# Patient Record
Sex: Male | Born: 1960 | Race: Black or African American | Hispanic: No | Marital: Single | Smoking: Never smoker
Health system: Southern US, Community
[De-identification: ages and names within clinical notes are randomized; demographics above are authoritative.]

## PROBLEM LIST (undated history)

## (undated) DIAGNOSIS — M6282 Rhabdomyolysis: Secondary | ICD-10-CM

## (undated) DIAGNOSIS — N289 Disorder of kidney and ureter, unspecified: Secondary | ICD-10-CM

## (undated) DIAGNOSIS — F79 Unspecified intellectual disabilities: Secondary | ICD-10-CM

## (undated) DIAGNOSIS — Z789 Other specified health status: Secondary | ICD-10-CM

## (undated) DIAGNOSIS — R1310 Dysphagia, unspecified: Secondary | ICD-10-CM

## (undated) DIAGNOSIS — K859 Acute pancreatitis without necrosis or infection, unspecified: Secondary | ICD-10-CM

## (undated) DIAGNOSIS — Z7289 Other problems related to lifestyle: Secondary | ICD-10-CM

## (undated) DIAGNOSIS — F32A Depression, unspecified: Secondary | ICD-10-CM

---

## 2009-02-07 ENCOUNTER — Emergency Department: Payer: Self-pay | Admitting: Emergency Medicine

## 2009-02-07 IMAGING — CT CT HEAD WITHOUT CONTRAST
2 series · 16 of 30 positions shown, 20 images · non-contrast
Comparison: none

REASON FOR EXAM: fall, etoh
COMMENTS:

PROCEDURE:     CT  - CT HEAD WITHOUT CONTRAST  - [DATE] [DATE]
RESULT:
HISTORY: Fall.

[Series 3: without · axial · non-contrast · 0.49mm/px · z∈[-16,+118]mm · 13 of 33 slices shown, 17 images]
[im 3/33  brain]
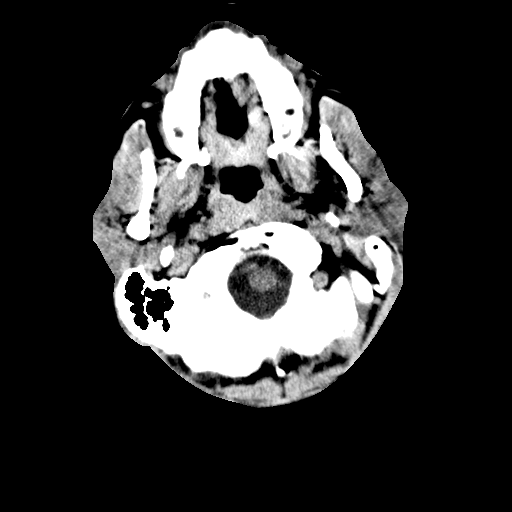
[im 3/33  bone]
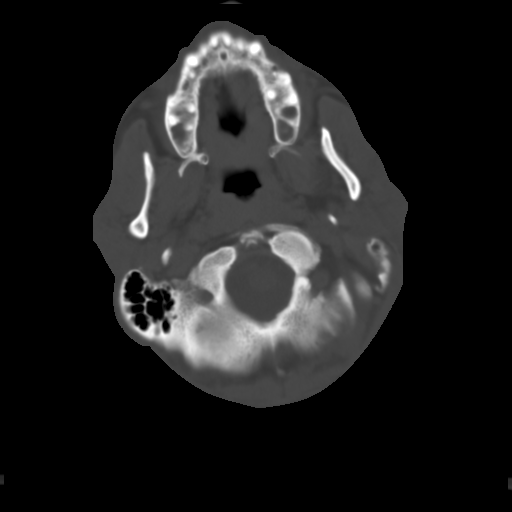
[im 5/33  brain]
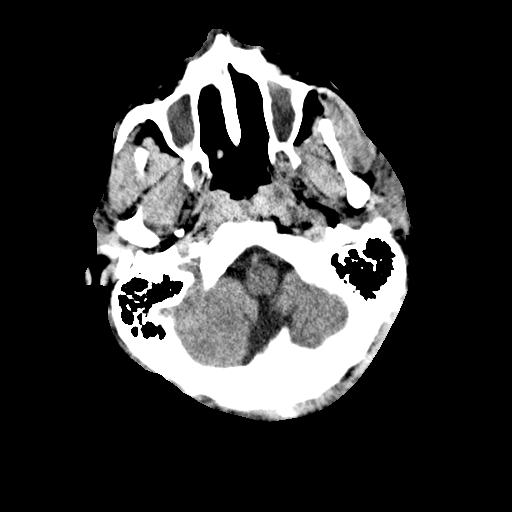
[im 7/33  brain]
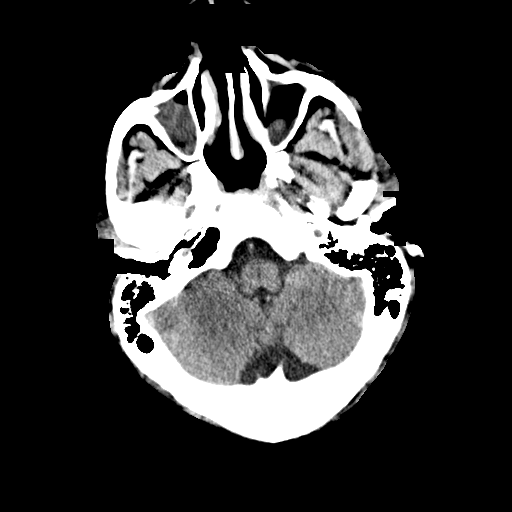
[im 10/33  brain]
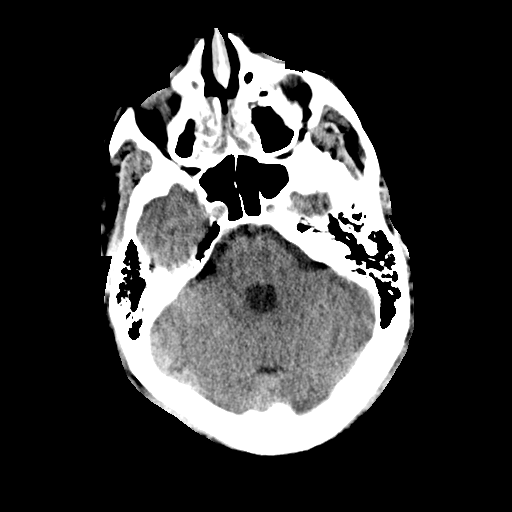
[im 12/33  brain]
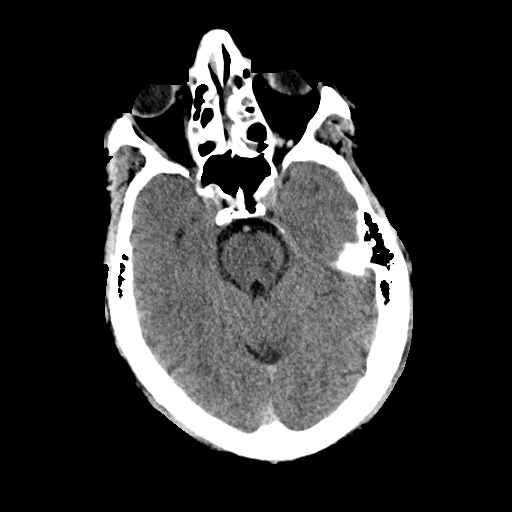
[im 12/33  bone]
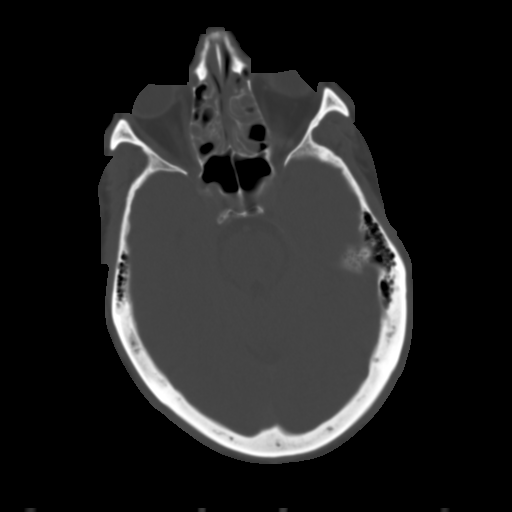
[im 14/33  brain]
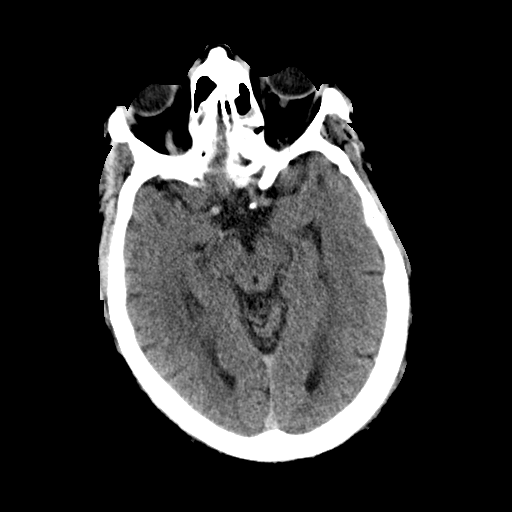
[im 17/33  brain]
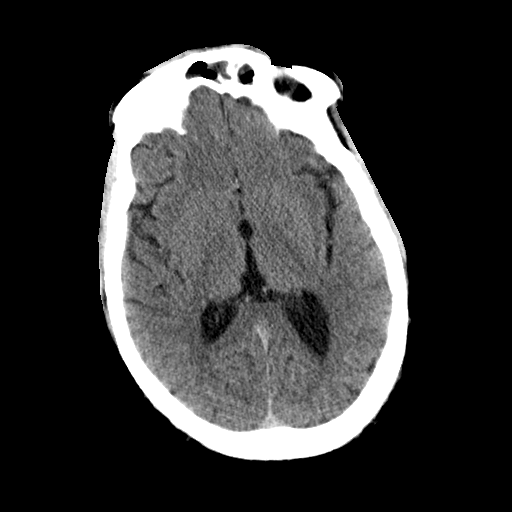
[im 19/33  brain]
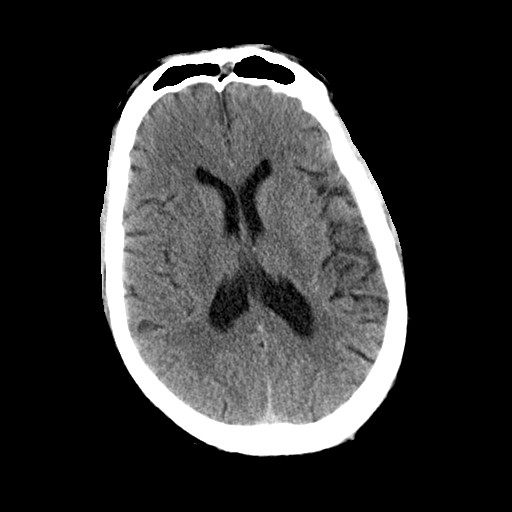
[im 21/33  brain]
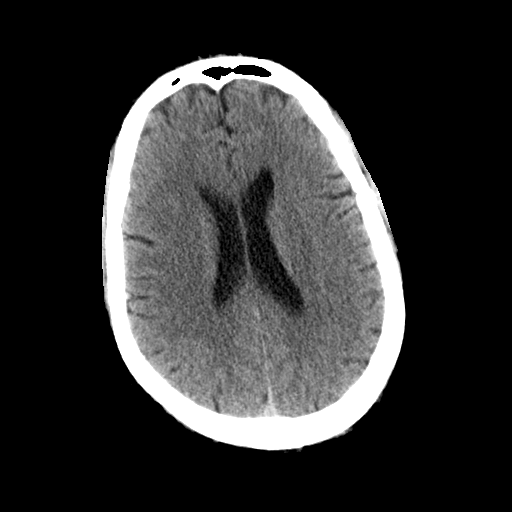
[im 21/33  bone]
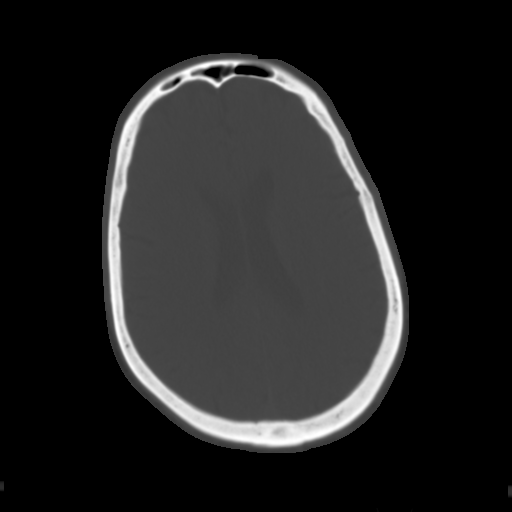
[im 23/33  brain]
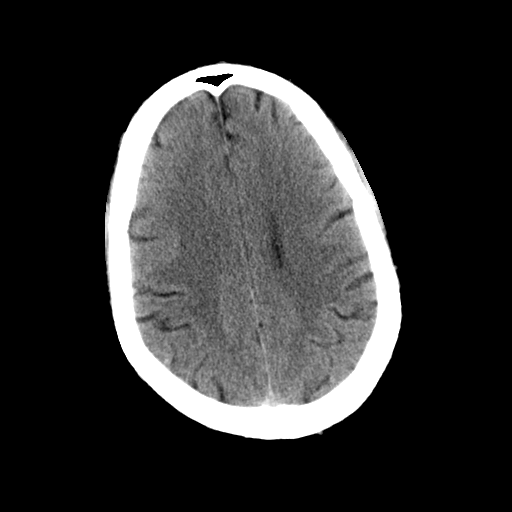
[im 26/33  brain]
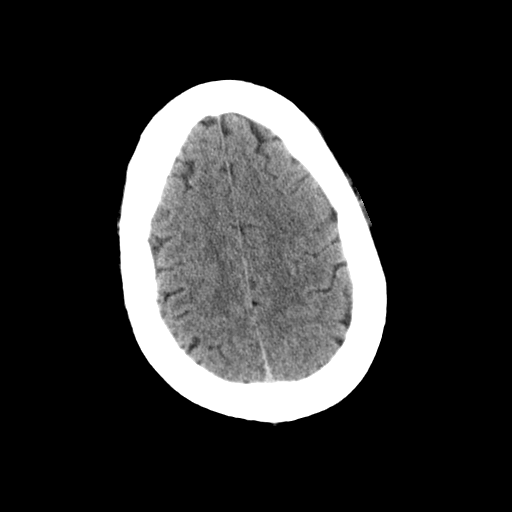
[im 28/33  brain]
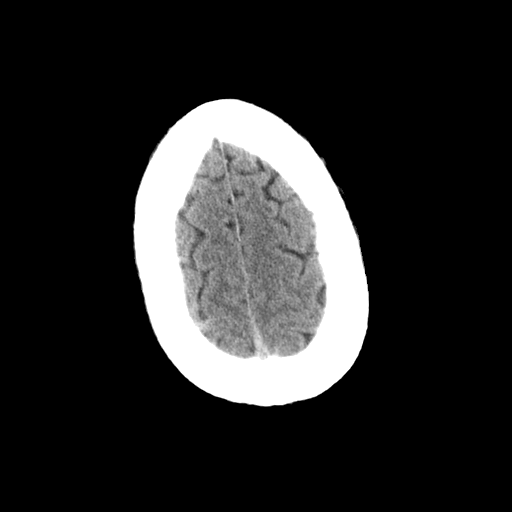
[im 30/33  brain]
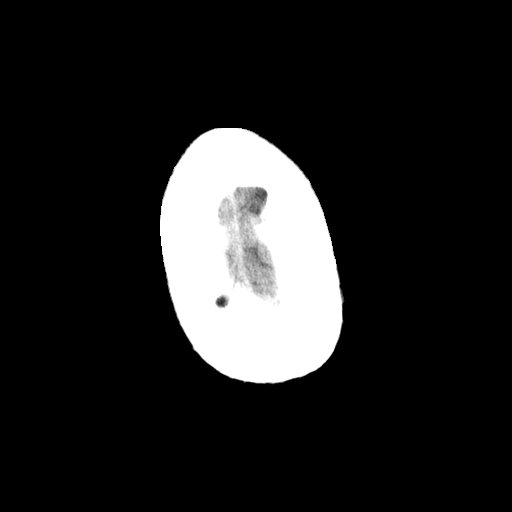
[im 30/33  bone]
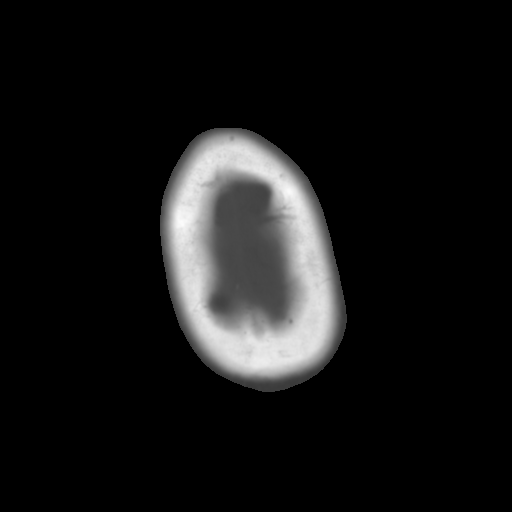

[Series 4: bone · axial · 0.49mm/px · z∈[-16,+28]mm · 3 of 33 slices shown]
[im 3/33  bone]
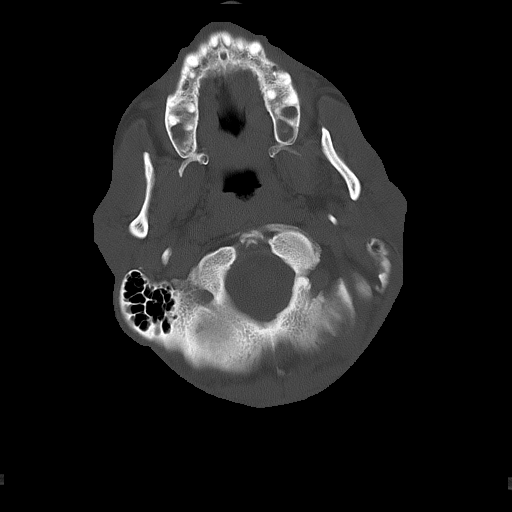
[im 7/33  bone]
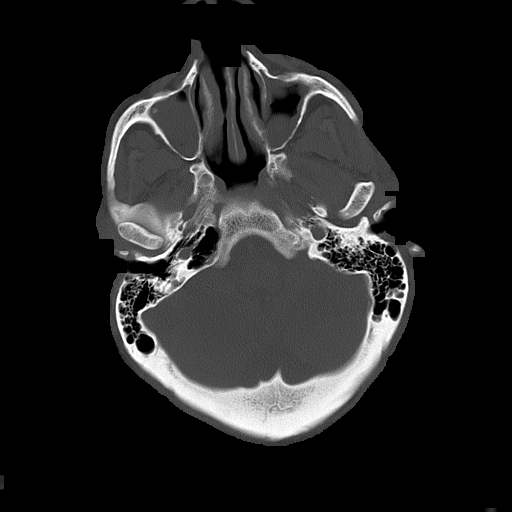
[im 12/33  bone]
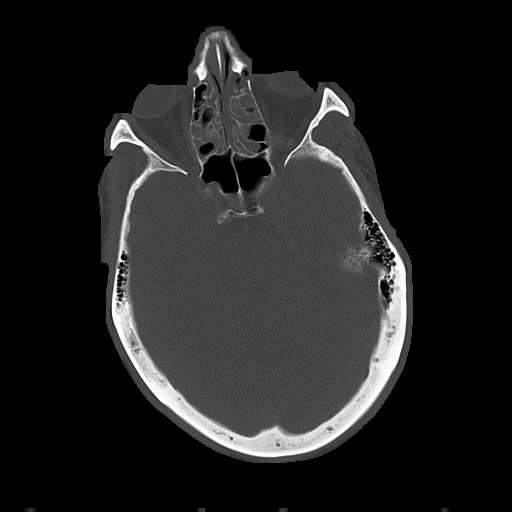

[16 of 30 positions shown; findings below may reference images not displayed]

COMPARISON STUDIES: None.

PROCEDURE AND FINDINGS: Standard non-enhanced Head CT is obtained. There is
a low density lesion in the LEFT pons. This is most consistent with an old
infarct. MRI can be obtained for further evaluation.  No acute intra-axial
or extra-axial pathologic fluid collections are noted. No hydrocephalus is
noted. No acute bony abnormalities are identified. Frontal, ethmoidal,
maxillary sinus changes are noted consistent with sinusitis.
IMPRESSION: 1. Low density lesion in the pons most consistent with old infarct.

2. Changes of sinusitis.

## 2009-02-07 IMAGING — CT CT CERVICAL SPINE WITHOUT CONTRAST
3 series · 16 of 33 positions shown, 19 images · non-contrast
Comparison: none

REASON FOR EXAM: fall, etoh
COMMENTS:

PROCEDURE:     CT  - CT CERVICAL SPINE WO  - [DATE] [DATE]
RESULT:
HISTORY: Fall.
COMPARISON STUDIES: No prior.
PROCEDURE AND FINDINGS: Standard Cervical Spine CT is obtained.  No evidence
of fracture. Good anatomic alignment is noted. No evidence of dislocation.
Diffuse degenerative change is noted.

[Series 4: axial · axial · 0.34mm/px · z∈[-173,-11]mm · 8 of 98 slices shown, 10 images]
[im 8/98  soft-tissue]
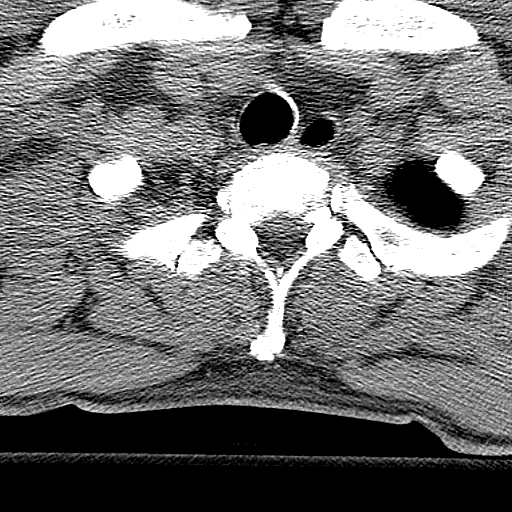
[im 8/98  bone]
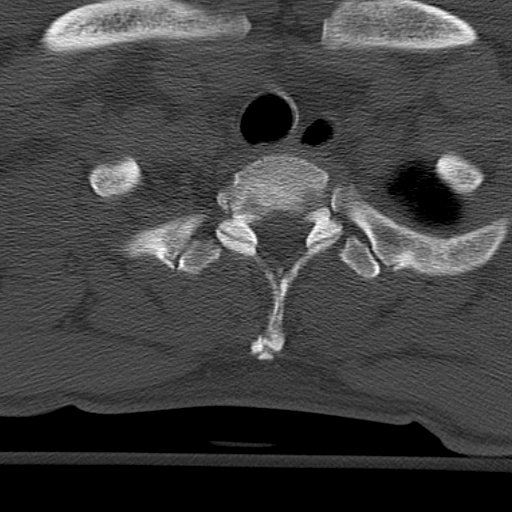
[im 23/98  bone]
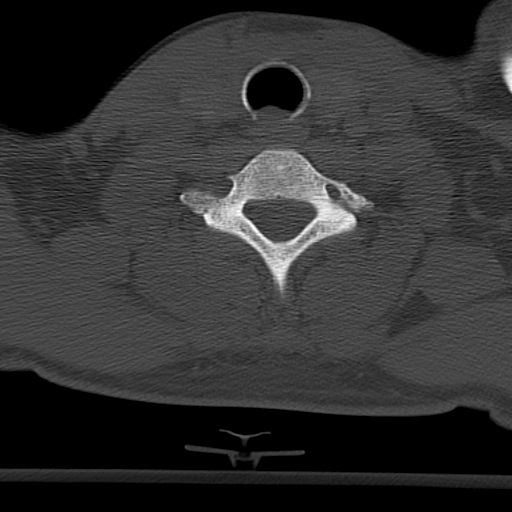
[im 30/98  bone]
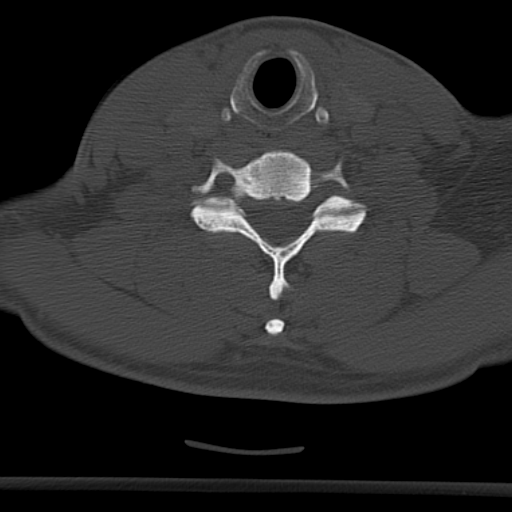
[im 45/98  bone]
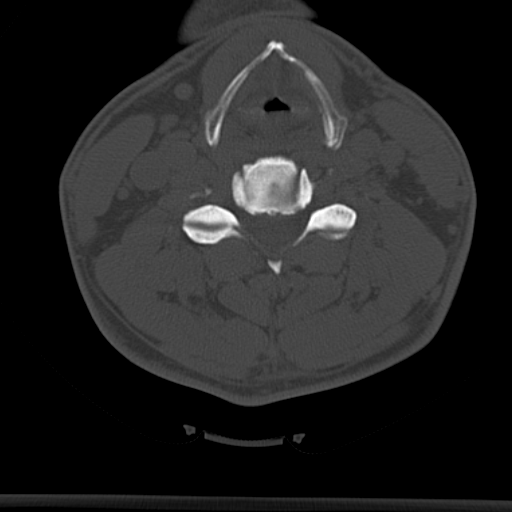
[im 53/98  soft-tissue]
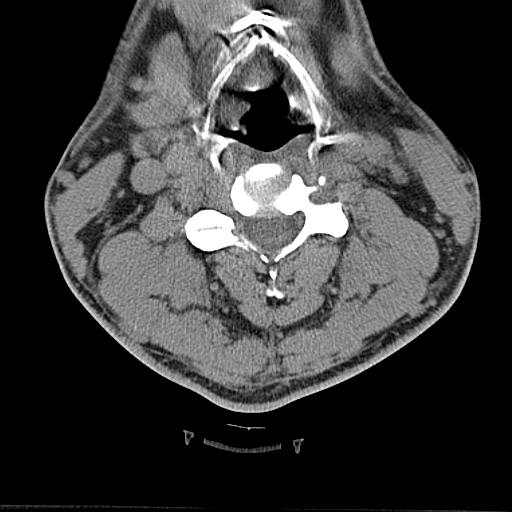
[im 53/98  bone]
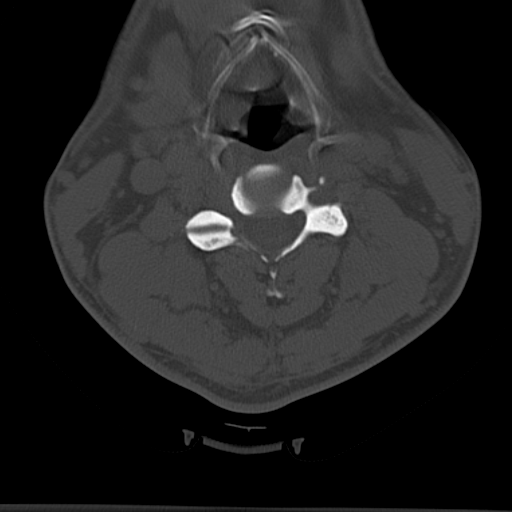
[im 68/98  bone]
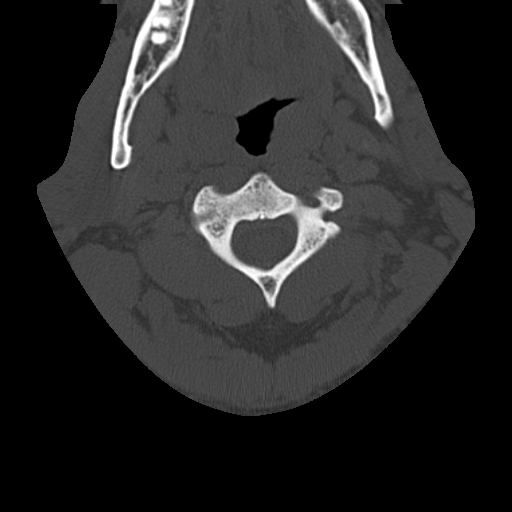
[im 75/98  bone]
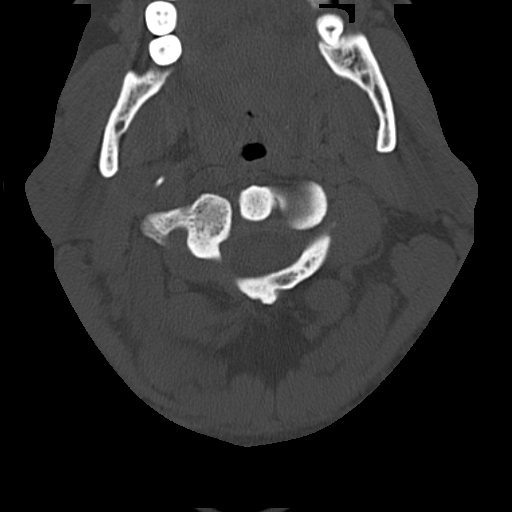
[im 90/98  bone]
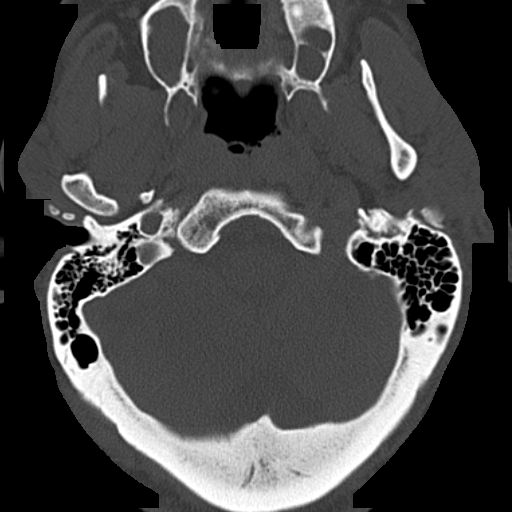

[Series 5: sagittal · sagittal · 0.50mm/px · 5 of 45 slices shown, 6 images]
[im 15/45  bone]
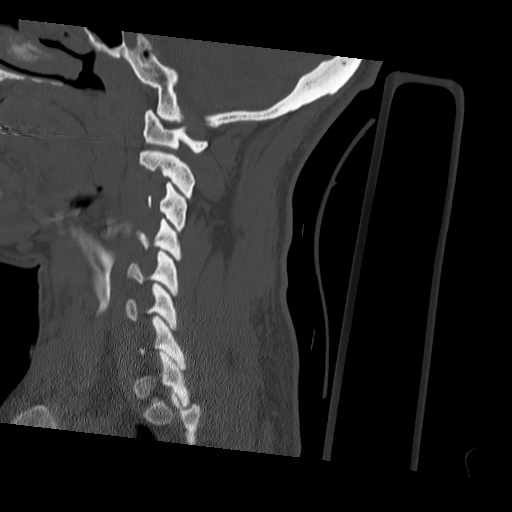
[im 19/45  bone]
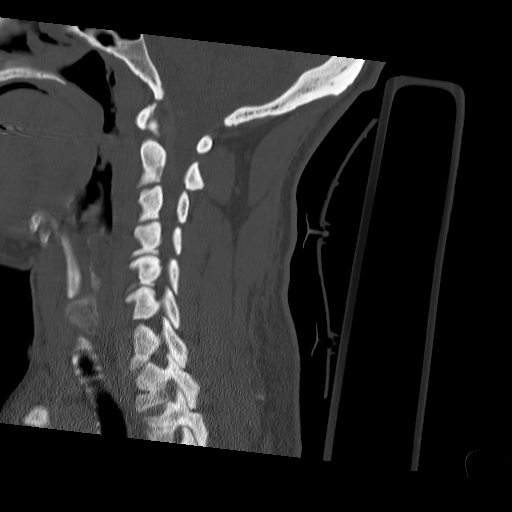
[im 23/45  soft-tissue]
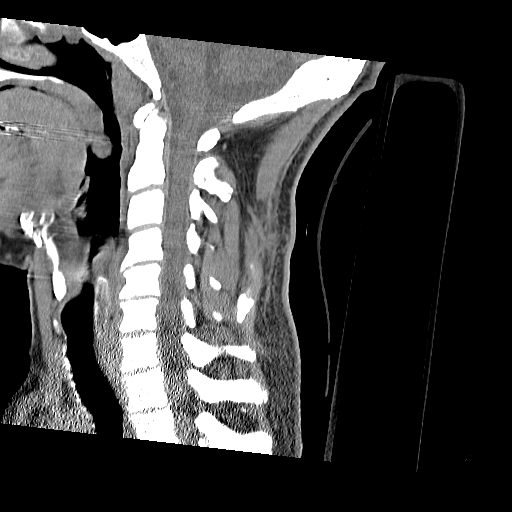
[im 23/45  bone]
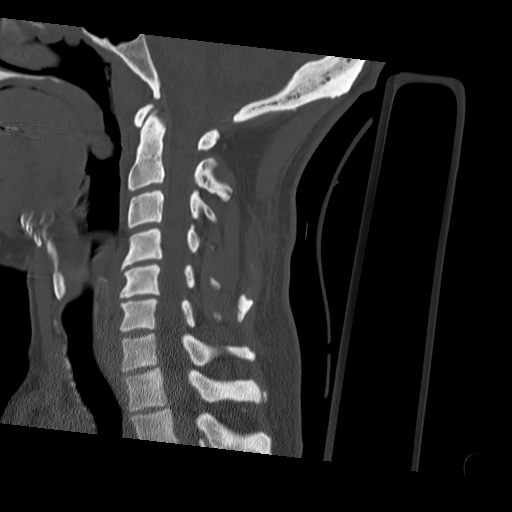
[im 26/45  bone]
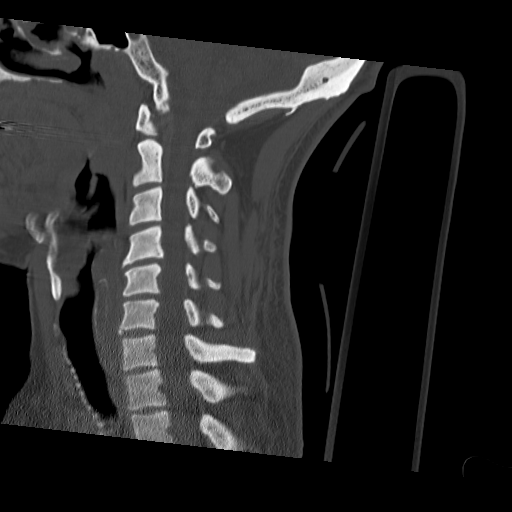
[im 30/45  bone]
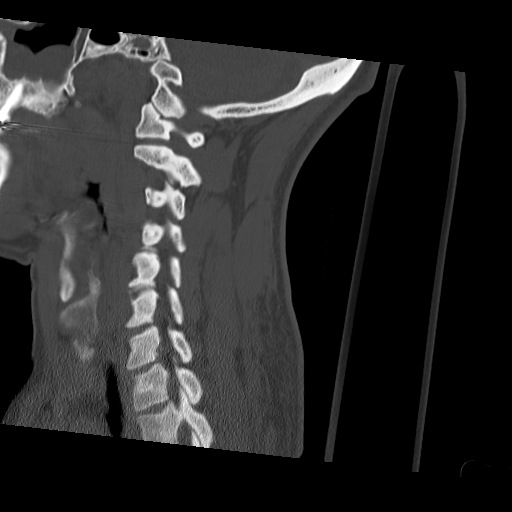

[Series 6: coronal · coronal · 0.46mm/px · 3 of 42 slices shown]
[im 9/42  bone]
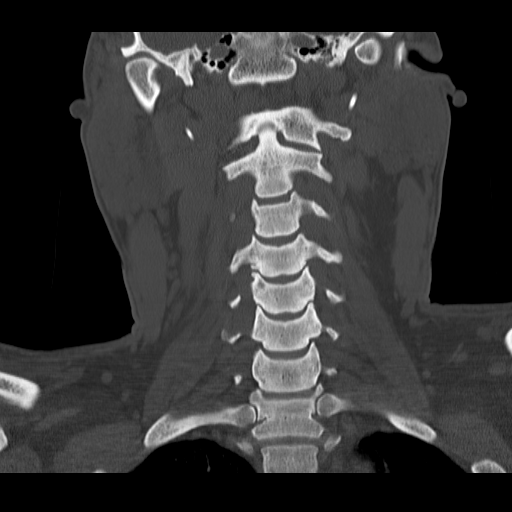
[im 17/42  bone]
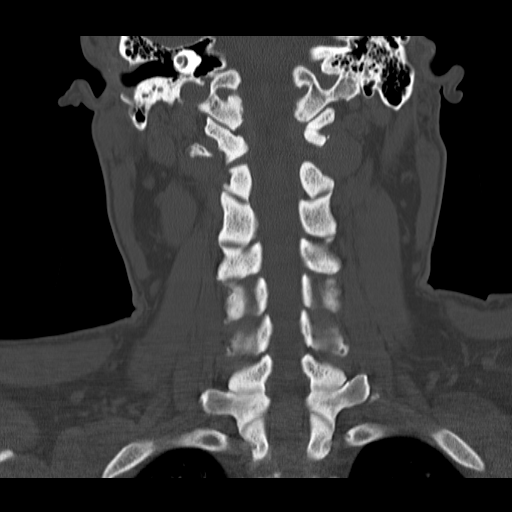
[im 25/42  bone]
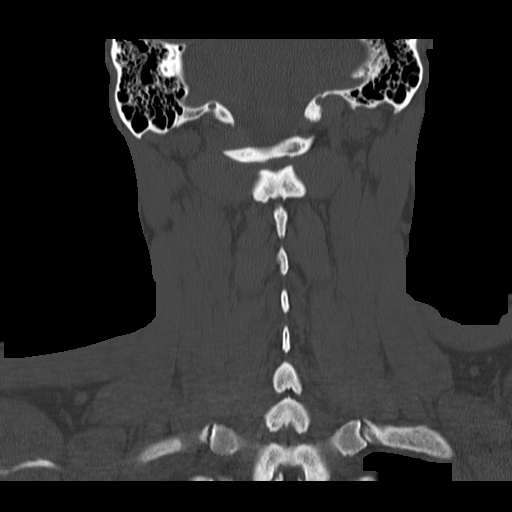

[16 of 33 positions shown; findings below may reference images not displayed]

IMPRESSION: 1. Diffuse degenerative change. No acute abnormality.

## 2009-04-10 ENCOUNTER — Emergency Department: Payer: Self-pay
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
## 2012-02-12 ENCOUNTER — Emergency Department: Payer: Self-pay | Admitting: Emergency Medicine

## 2012-02-29 ENCOUNTER — Emergency Department: Payer: Self-pay | Admitting: Emergency Medicine

## 2017-09-14 ENCOUNTER — Emergency Department: Payer: Medicare Other

## 2017-09-14 ENCOUNTER — Encounter: Payer: Self-pay | Admitting: Emergency Medicine

## 2017-09-14 ENCOUNTER — Emergency Department
Admission: EM | Admit: 2017-09-14 | Discharge: 2017-09-15 | Disposition: A | Discharge status: Home / Self Care | Payer: Medicare Other | Attending: Emergency Medicine | Admitting: Emergency Medicine

## 2017-09-14 DIAGNOSIS — Y33XXXA Other specified events, undetermined intent, initial encounter: Secondary | ICD-10-CM | POA: Insufficient documentation

## 2017-09-14 DIAGNOSIS — Y939 Activity, unspecified: Secondary | ICD-10-CM | POA: Insufficient documentation

## 2017-09-14 DIAGNOSIS — S0081XA Abrasion of other part of head, initial encounter: Secondary | ICD-10-CM

## 2017-09-14 DIAGNOSIS — X31XXXA Exposure to excessive natural cold, initial encounter: Secondary | ICD-10-CM | POA: Diagnosis not present

## 2017-09-14 DIAGNOSIS — Z23 Encounter for immunization: Secondary | ICD-10-CM | POA: Diagnosis not present

## 2017-09-14 DIAGNOSIS — F10129 Alcohol abuse with intoxication, unspecified: Secondary | ICD-10-CM | POA: Diagnosis not present

## 2017-09-14 DIAGNOSIS — T68XXXA Hypothermia, initial encounter: Secondary | ICD-10-CM | POA: Diagnosis not present

## 2017-09-14 DIAGNOSIS — Y92007 Garden or yard of unspecified non-institutional (private) residence as the place of occurrence of the external cause: Secondary | ICD-10-CM | POA: Diagnosis not present

## 2017-09-14 DIAGNOSIS — F10929 Alcohol use, unspecified with intoxication, unspecified: Secondary | ICD-10-CM

## 2017-09-14 DIAGNOSIS — S0990XA Unspecified injury of head, initial encounter: Secondary | ICD-10-CM

## 2017-09-14 DIAGNOSIS — Y998 Other external cause status: Secondary | ICD-10-CM | POA: Insufficient documentation

## 2017-09-14 LAB — ACETAMINOPHEN LEVEL

## 2017-09-14 LAB — CBC WITH DIFFERENTIAL/PLATELET
BASOS ABS: 0 10*3/uL (ref 0–0.1)
Basophils Relative: 1 %
Eosinophils Absolute: 0.1 10*3/uL (ref 0–0.7)
Eosinophils Relative: 3 %
HEMATOCRIT: 49.2 % (ref 40.0–52.0)
Hemoglobin: 16 g/dL (ref 13.0–18.0)
LYMPHS ABS: 1.6 10*3/uL (ref 1.0–3.6)
LYMPHS PCT: 32 %
MCH: 28.8 pg (ref 26.0–34.0)
MCHC: 32.6 g/dL (ref 32.0–36.0)
MCV: 88.2 fL (ref 80.0–100.0)
Monocytes Absolute: 0.4 10*3/uL (ref 0.2–1.0)
Monocytes Relative: 8 %
NEUTROS ABS: 2.8 10*3/uL (ref 1.4–6.5)
Neutrophils Relative %: 56 %
Platelets: 192 10*3/uL (ref 150–440)
RBC: 5.57 MIL/uL (ref 4.40–5.90)
RDW: 13 % (ref 11.5–14.5)
WBC: 5 10*3/uL (ref 3.8–10.6)

## 2017-09-14 LAB — ETHANOL: ALCOHOL ETHYL (B): 261 mg/dL — AB (ref <10)

## 2017-09-14 LAB — COMPREHENSIVE METABOLIC PANEL
ALBUMIN: 4.3 g/dL (ref 3.5–5.0)
ALT: 34 U/L (ref 17–63)
ANION GAP: 9 (ref 5–15)
AST: 45 U/L — ABNORMAL HIGH (ref 15–41)
Alkaline Phosphatase: 68 U/L (ref 38–126)
BILIRUBIN TOTAL: 0.5 mg/dL (ref 0.3–1.2)
BUN: 11 mg/dL (ref 6–20)
CHLORIDE: 102 mmol/L (ref 101–111)
CO2: 29 mmol/L (ref 22–32)
Calcium: 9.1 mg/dL (ref 8.9–10.3)
Creatinine, Ser: 0.85 mg/dL (ref 0.61–1.24)
GFR calc Af Amer: >60 mL/min (ref >60)
GFR calc non Af Amer: >60 mL/min (ref >60)
GLUCOSE: 129 mg/dL — AB (ref 65–99)
POTASSIUM: 3.6 mmol/L (ref 3.5–5.1)
SODIUM: 140 mmol/L (ref 135–145)
TOTAL PROTEIN: 8.4 g/dL — AB (ref 6.5–8.1)

## 2017-09-14 LAB — TROPONIN I: Troponin I: <0.03 ng/mL (ref <0.03)

## 2017-09-14 LAB — CK: Total CK: 553 U/L — ABNORMAL HIGH (ref 49–397)
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
## 2017-09-15 ENCOUNTER — Emergency Department: Payer: Medicare Other

## 2017-09-15 DIAGNOSIS — F10129 Alcohol abuse with intoxication, unspecified: Secondary | ICD-10-CM | POA: Diagnosis not present

## 2017-09-15 LAB — URINALYSIS, COMPLETE (UACMP) WITH MICROSCOPIC
BACTERIA UA: NONE SEEN
Bilirubin Urine: NEGATIVE
GLUCOSE, UA: NEGATIVE mg/dL
HGB URINE DIPSTICK: NEGATIVE
KETONES UR: 5 mg/dL — AB
Leukocytes, UA: NEGATIVE
NITRITE: NEGATIVE
PROTEIN: NEGATIVE mg/dL
Specific Gravity, Urine: 1.013 (ref 1.005–1.030)
pH: 5 (ref 5.0–8.0)

## 2017-09-15 LAB — URINE DRUG SCREEN, QUALITATIVE (ARMC ONLY)
Amphetamines, Ur Screen: NOT DETECTED
BARBITURATES, UR SCREEN: NOT DETECTED
BENZODIAZEPINE, UR SCRN: NOT DETECTED
CANNABINOID 50 NG, UR ~~LOC~~: NOT DETECTED
Cocaine Metabolite,Ur ~~LOC~~: NOT DETECTED
MDMA (Ecstasy)Ur Screen: NOT DETECTED
Methadone Scn, Ur: NOT DETECTED
Opiate, Ur Screen: NOT DETECTED
PHENCYCLIDINE (PCP) UR S: NOT DETECTED
TRICYCLIC, UR SCREEN: NOT DETECTED

## 2017-09-15 LAB — CK: Total CK: 517 U/L — ABNORMAL HIGH (ref 49–397)

## 2017-09-15 IMAGING — CR DG CHEST 1V PORT
1 series · 1 of 1 positions shown · non-contrast
Comparison: None.

CLINICAL DATA: Syncope

EXAM:
PORTABLE CHEST 1 VIEW

[chest ap]
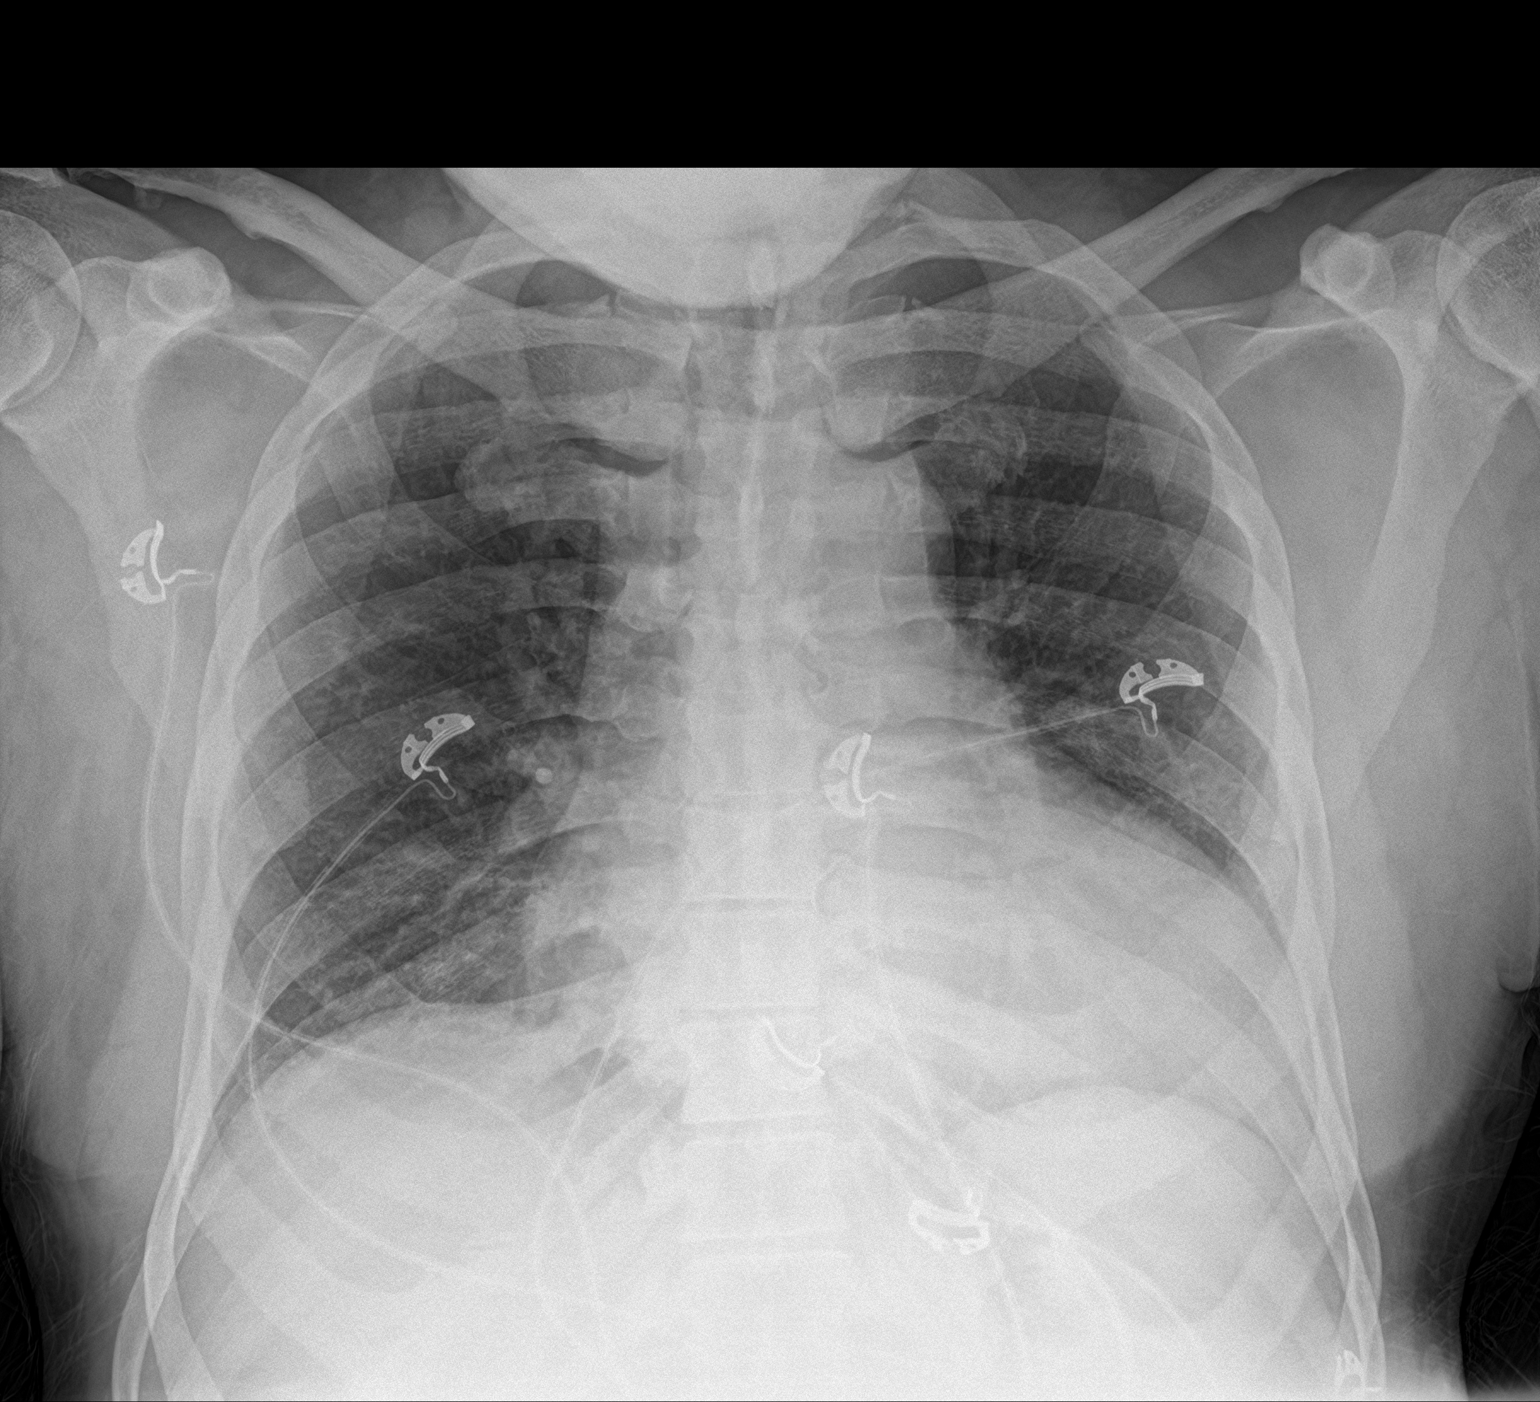

[1 of 1 positions shown; findings below may reference images not displayed]

FINDINGS: Mild cardiomegaly.  No consolidation or effusion.  No pneumothorax.
IMPRESSION: Cardiomegaly.  Negative for edema or infiltrate.

## 2017-09-15 IMAGING — CT CT HEAD W/O CM
5 of 7 series · 17 of 47 positions shown, 18 images · non-contrast
Comparison: None.

CLINICAL DATA: Patient found down. Concern for head or cervical
spine injury.

EXAM:
CT HEAD WITHOUT CONTRAST
CT CERVICAL SPINE WITHOUT CONTRAST
TECHNIQUE: Multidetector CT imaging of the head and cervical spine was
performed following the standard protocol without intravenous
contrast. Multiplanar CT image reconstructions of the cervical spine
were also generated.

[Series 3: head wo · axial · 0.44mm/px · z∈[-48,+2]mm · 2 of 32 slices shown, 3 images]
[im 11/32  brain]
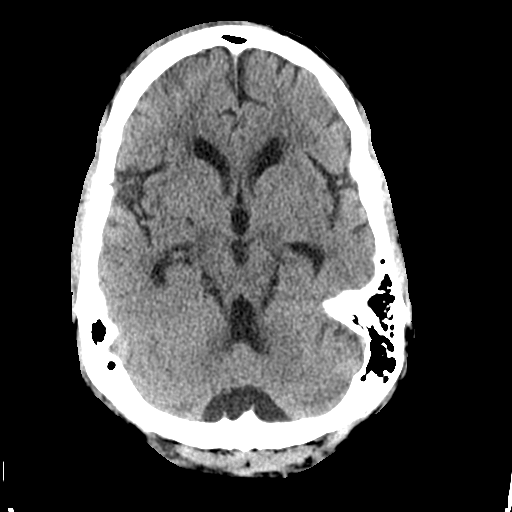
[im 11/32  bone]
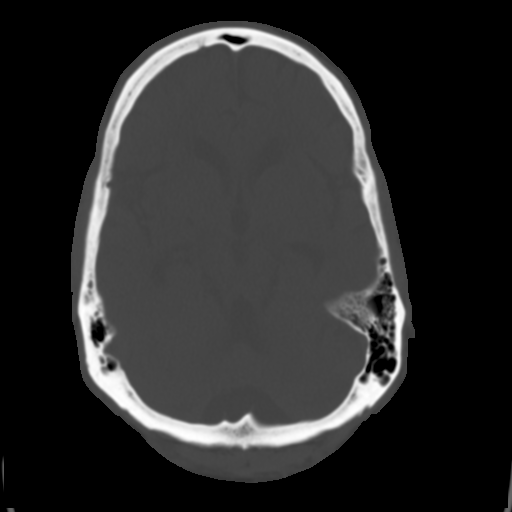
[im 21/32  brain]
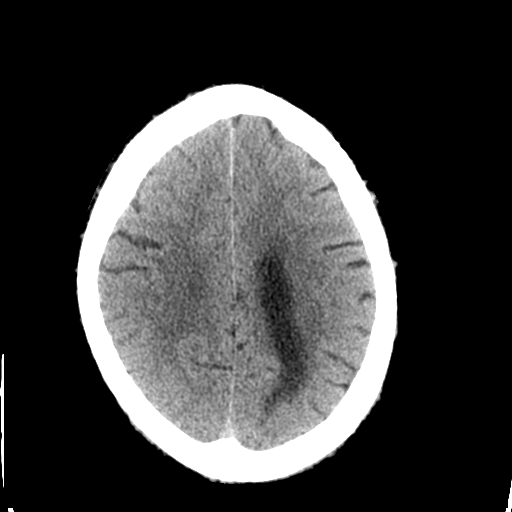

[Series 5: c spine soft · axial · 0.36mm/px · z∈[-254,-216]mm · 3 of 96 slices shown]
[im 10/96  brain]
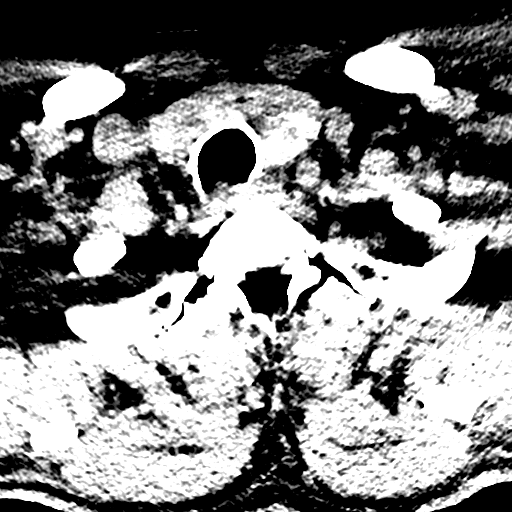
[im 20/96  brain]
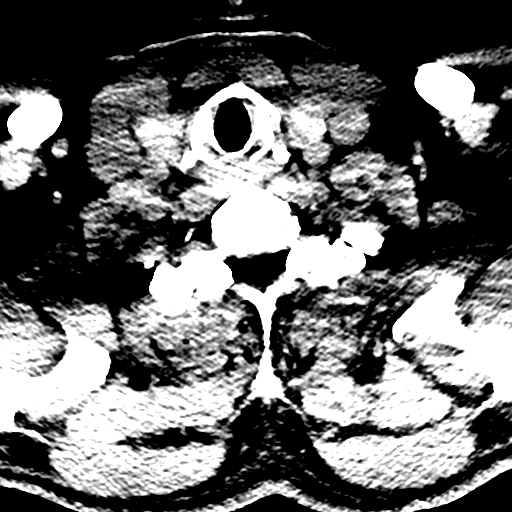
[im 29/96  brain]
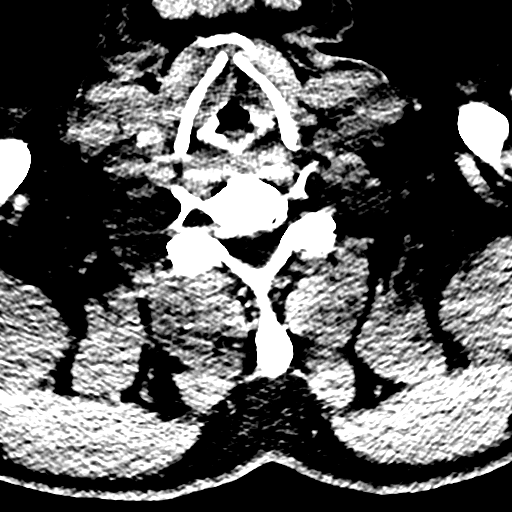

[Series 8: coronal soft tissue · coronal · 0.33mm/px · 3 of 71 slices shown]
[im 24/71  brain]
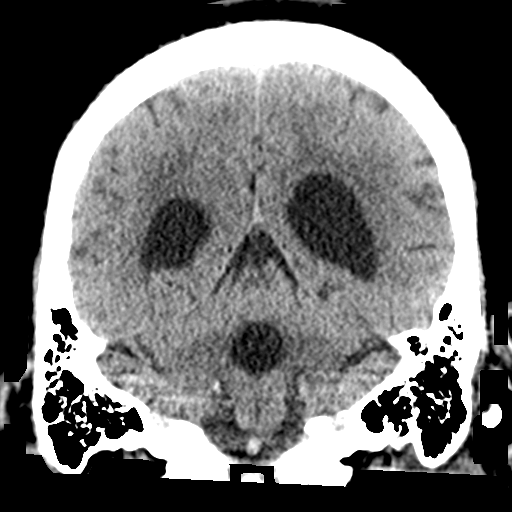
[im 36/71  brain]
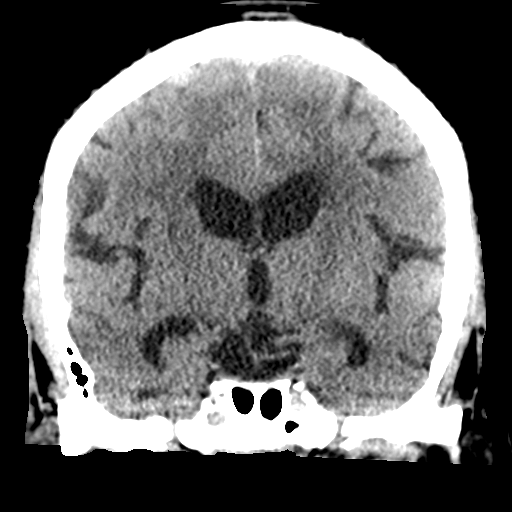
[im 47/71  brain]
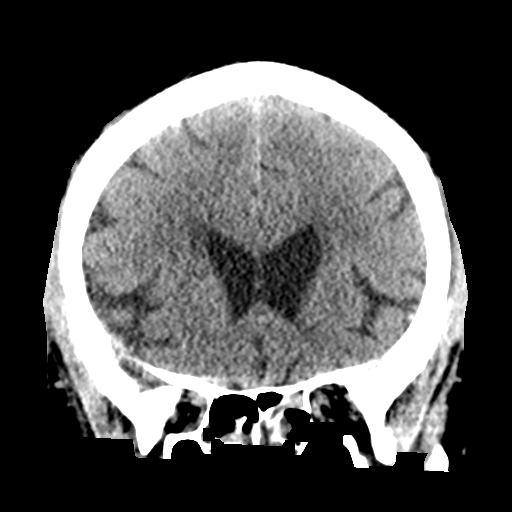

[Series 9: sagittal soft tissue · sagittal · 0.33mm/px · 1 of 56 slices shown]
[im 28/56  brain]
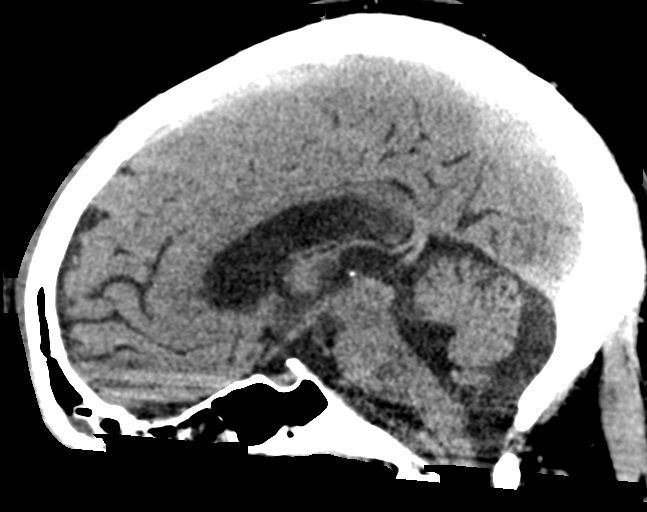

[Series 12: orthogonal bone · axial · 0.29mm/px · z∈[-318,-116]mm · 8 of 129 slices shown]
[im 10/129  bone]
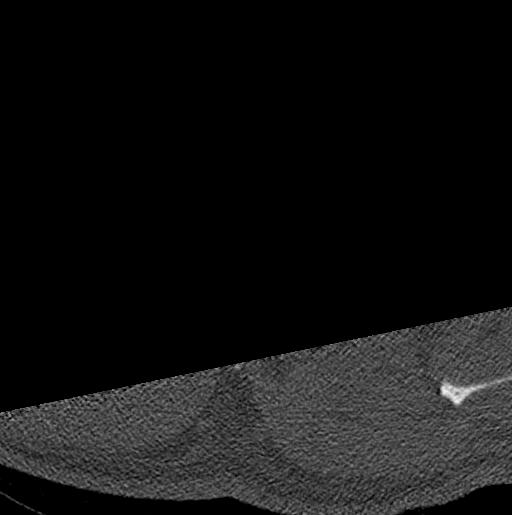
[im 28/129  bone]
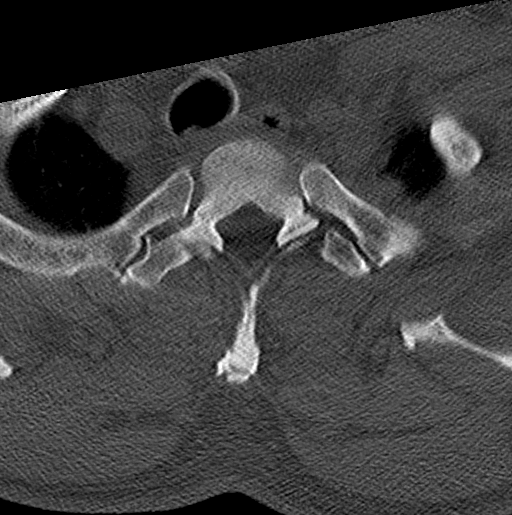
[im 46/129  bone]
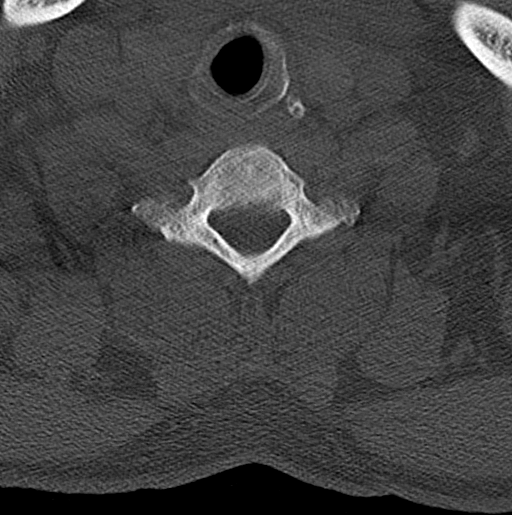
[im 55/129  bone]
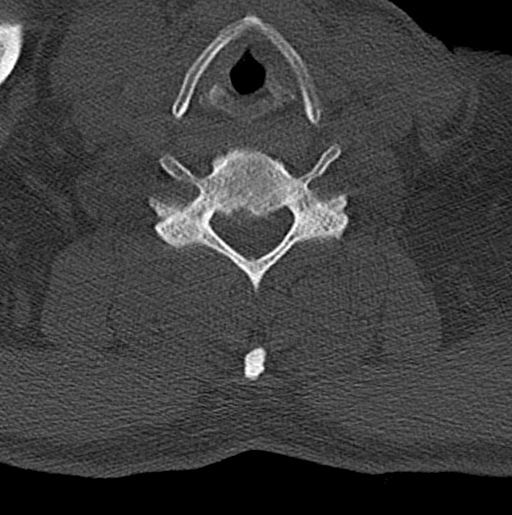
[im 74/129  bone]
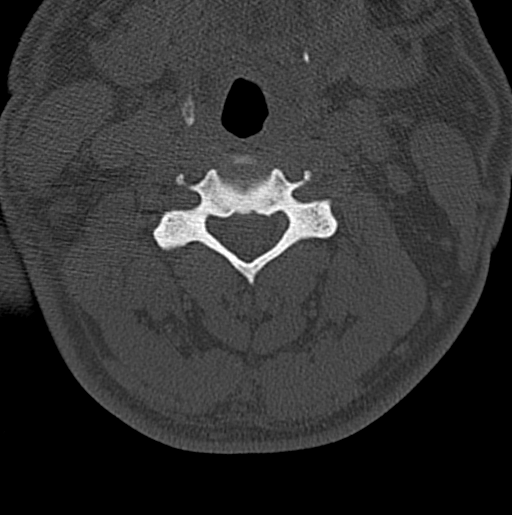
[im 83/129  bone]
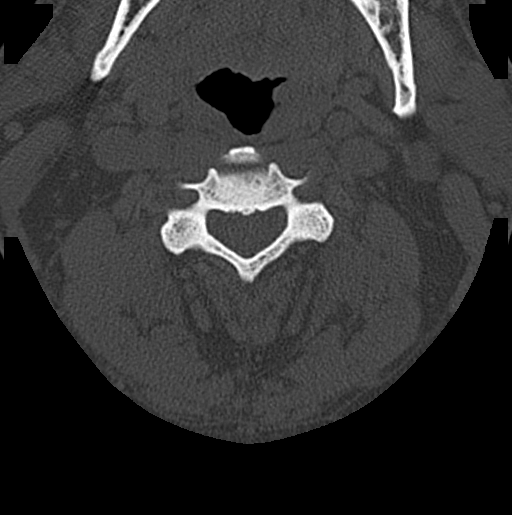
[im 101/129  bone]
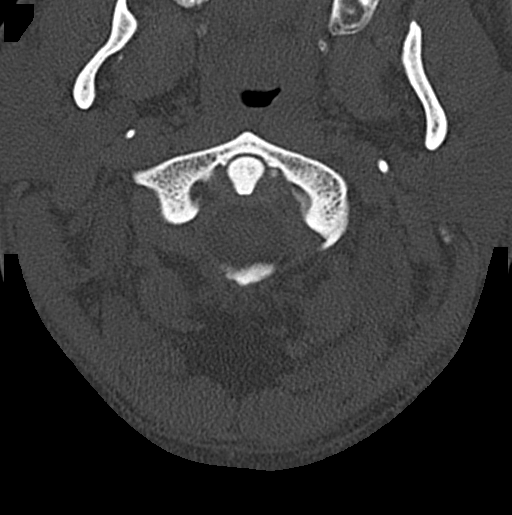
[im 119/129  bone]
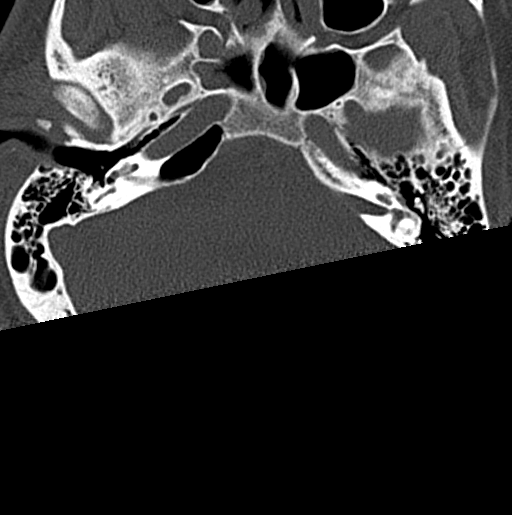

[17 of 47 positions shown; findings below may reference images not displayed]

FINDINGS: CT HEAD FINDINGS

Brain: No evidence of acute infarction, hemorrhage, hydrocephalus,
extra-axial collection or mass lesion / mass effect.

Prominence of the ventricles and sulci reflects mild cortical volume
loss. Mild cerebellar atrophy is noted.

The brainstem and fourth ventricle are within normal limits. The
basal ganglia are unremarkable in appearance. The cerebral
hemispheres demonstrate grossly normal gray-white differentiation.
No mass effect or midline shift is seen.

Vascular: No hyperdense vessel or unexpected calcification.
Minimally prominent density along the anterior falx cerebri is
thought to reflect calcification.

Skull: There is no evidence of fracture; visualized osseous
structures are unremarkable in appearance.

Sinuses/Orbits: The visualized portions of the orbits are within
normal limits. There is opacification of the ethmoid air cells and
mucosal thickening at the frontal sinuses and right side of the
sphenoid sinus. The remaining paranasal sinuses and mastoid air
cells are well-aerated.

Other: Soft tissue swelling is noted overlying the frontal
calvarium.

CT CERVICAL SPINE FINDINGS

Alignment: Normal.

Skull base and vertebrae: No acute fracture. No primary bone lesion
or focal pathologic process.

Soft tissues and spinal canal: No prevertebral fluid or swelling. No
visible canal hematoma.

Disc levels: Intervertebral disc spaces are preserved. Mild
scattered anterior and posterior disc osteophyte complexes are noted
along the cervical spine.

Upper chest: The visualized lung apices are clear. The thyroid gland
is unremarkable in appearance.

Other: No additional soft tissue abnormalities are seen.
IMPRESSION: 1. No evidence of traumatic intracranial injury or fracture.
2. No evidence of fracture or subluxation along the cervical spine.
3. Soft tissue swelling overlying the frontal calvarium.
4. Mild cortical volume loss.
5. Minimal degenerative change along the cervical spine.

## 2017-09-15 MED ORDER — SODIUM CHLORIDE 0.9 % IV BOLUS (SEPSIS)
1000.0000 mL | INTRAVENOUS | Status: AC
  Administered 2017-09-15: 1000 mL via INTRAVENOUS

## 2017-09-15 MED ORDER — TETANUS-DIPHTH-ACELL PERTUSSIS 5-2.5-18.5 LF-MCG/0.5 IM SUSP
0.5000 mL | Freq: Once | INTRAMUSCULAR | Status: AC
  Administered 2017-09-15: 0.5 mL via INTRAMUSCULAR
  Filled 2017-09-15: qty 0.5

## 2017-09-15 NOTE — ED Notes (Signed)
This RN to bedside at this time. Introduced self to patient. Pt is noted to be mildly confused, repeatedly asking for his previous nurse Lorriane Shire, Therapist, sports. Pt is noted to be alert, speech is noted to be slightly slurred but otherwise clear. Pt asking for a taxi to go home. This RN explained that he could not go home in a taxi.

## 2017-09-15 NOTE — ED Notes (Signed)
This RN to bedside due to patient bed alarm ringing. Pt found to be sitting on end of bed. Pt assisted to use the urinal, assisted back into a reclining position by this RN. Pt tolerated well. Will continue to monitor for further patient needs.

## 2017-09-15 NOTE — ED Provider Notes (Signed)
Ct Head Wo Contrast  Result Date: 09/15/2017 CLINICAL DATA:  Patient found down. Concern for head or cervical spine injury. EXAM: CT HEAD WITHOUT CONTRAST CT CERVICAL SPINE WITHOUT CONTRAST TECHNIQUE: Multidetector CT imaging of the head and cervical spine was performed following the standard protocol without intravenous contrast. Multiplanar CT image reconstructions of the cervical spine were also generated. COMPARISON:  None. FINDINGS: CT HEAD FINDINGS Brain: No evidence of acute infarction, hemorrhage, hydrocephalus, extra-axial collection or mass lesion / mass effect. Prominence of the ventricles and sulci reflects mild cortical volume loss. Mild cerebellar atrophy is noted. The brainstem and fourth ventricle are within normal limits. The basal ganglia are unremarkable in appearance. The cerebral hemispheres demonstrate grossly normal gray-white differentiation. No mass effect or midline shift is seen. Vascular: No hyperdense vessel or unexpected calcification. Minimally prominent density along the anterior falx cerebri is thought to reflect calcification. Skull: There is no evidence of fracture; visualized osseous structures are unremarkable in appearance. Sinuses/Orbits: The visualized portions of the orbits are within normal limits. There is opacification of the ethmoid air cells and mucosal thickening at the frontal sinuses and right side of the sphenoid sinus. The remaining paranasal sinuses and mastoid air cells are well-aerated. Other: Soft tissue swelling is noted overlying the frontal calvarium. CT CERVICAL SPINE FINDINGS Alignment: Normal. Skull base and vertebrae: No acute fracture. No primary bone lesion or focal pathologic process. Soft tissues and spinal canal: No prevertebral fluid or swelling. No visible canal hematoma. Disc levels: Intervertebral disc spaces are preserved. Mild scattered anterior and posterior disc osteophyte complexes are noted along the cervical spine. Upper chest: The  visualized lung apices are clear. The thyroid gland is unremarkable in appearance. Other: No additional soft tissue abnormalities are seen. IMPRESSION: 1. No evidence of traumatic intracranial injury or fracture. 2. No evidence of fracture or subluxation along the cervical spine. 3. Soft tissue swelling overlying the frontal calvarium. 4. Mild cortical volume loss. 5. Minimal degenerative change along the cervical spine. Electronically Signed   By: Garald Balding M.D.   On: 09/15/2017 01:31   Ct Cervical Spine Wo Contrast  Result Date: 09/15/2017 CLINICAL DATA:  Patient found down. Concern for head or cervical spine injury. EXAM: CT HEAD WITHOUT CONTRAST CT CERVICAL SPINE WITHOUT CONTRAST TECHNIQUE: Multidetector CT imaging of the head and cervical spine was performed following the standard protocol without intravenous contrast. Multiplanar CT image reconstructions of the cervical spine were also generated. COMPARISON:  None. FINDINGS: CT HEAD FINDINGS Brain: No evidence of acute infarction, hemorrhage, hydrocephalus, extra-axial collection or mass lesion / mass effect. Prominence of the ventricles and sulci reflects mild cortical volume loss. Mild cerebellar atrophy is noted. The brainstem and fourth ventricle are within normal limits. The basal ganglia are unremarkable in appearance. The cerebral hemispheres demonstrate grossly normal gray-white differentiation. No mass effect or midline shift is seen. Vascular: No hyperdense vessel or unexpected calcification. Minimally prominent density along the anterior falx cerebri is thought to reflect calcification. Skull: There is no evidence of fracture; visualized osseous structures are unremarkable in appearance. Sinuses/Orbits: The visualized portions of the orbits are within normal limits. There is opacification of the ethmoid air cells and mucosal thickening at the frontal sinuses and right side of the sphenoid sinus. The remaining paranasal sinuses and mastoid  air cells are well-aerated. Other: Soft tissue swelling is noted overlying the frontal calvarium. CT CERVICAL SPINE FINDINGS Alignment: Normal. Skull base and vertebrae: No acute fracture. No primary bone lesion or focal pathologic process. Soft  tissues and spinal canal: No prevertebral fluid or swelling. No visible canal hematoma. Disc levels: Intervertebral disc spaces are preserved. Mild scattered anterior and posterior disc osteophyte complexes are noted along the cervical spine. Upper chest: The visualized lung apices are clear. The thyroid gland is unremarkable in appearance. Other: No additional soft tissue abnormalities are seen. IMPRESSION: 1. No evidence of traumatic intracranial injury or fracture. 2. No evidence of fracture or subluxation along the cervical spine. 3. Soft tissue swelling overlying the frontal calvarium. 4. Mild cortical volume loss. 5. Minimal degenerative change along the cervical spine. Electronically Signed   By: Garald Balding M.D.   On: 09/15/2017 01:31   Dg Chest Portable 1 View  Result Date: 09/15/2017 CLINICAL DATA:  Syncope EXAM: PORTABLE CHEST 1 VIEW COMPARISON:  None. FINDINGS: Mild cardiomegaly.  No consolidation or effusion.  No pneumothorax. IMPRESSION: Cardiomegaly.  Negative for edema or infiltrate. Electronically Signed   By: Donavan Foil M.D.   On: 09/15/2017 01:25     Labs Reviewed  URINALYSIS, COMPLETE (UACMP) WITH MICROSCOPIC - Abnormal; Notable for the following components:      Result Value   Color, Urine YELLOW (*)    APPearance CLEAR (*)    Ketones, ur 5 (*)    Squamous Epithelial / LPF 0-5 (*)    All other components within normal limits  ACETAMINOPHEN LEVEL - Abnormal; Notable for the following components:   Acetaminophen (Tylenol), Serum <10 (*)    All other components within normal limits  COMPREHENSIVE METABOLIC PANEL - Abnormal; Notable for the following components:   Glucose, Bld 129 (*)    Total Protein 8.4 (*)    AST 45 (*)    All  other components within normal limits  ETHANOL - Abnormal; Notable for the following components:   Alcohol, Ethyl (B) 261 (*)    All other components within normal limits  CK - Abnormal; Notable for the following components:   Total CK 553 (*)    All other components within normal limits  CK - Abnormal; Notable for the following components:   Total CK 517 (*)    All other components within normal limits  URINE DRUG SCREEN, QUALITATIVE ([facility] ONLY)  CBC WITH DIFFERENTIAL/PLATELET  TROPONIN I     Clinical Course as of Sep 15 736  Thu Sep 15, 2017  0047 Assuming care from Dr. Cinda Quest.  In short, Martin Diaz is a 57 y.o. male with a chief complaint of intoxication and possible head injury.  Refer to the original H&P for additional details.  The current plan of care is to follow up CT scans, recheck CK after fluids, and monitor during the night until clinically sober (assuming no acute findings on workup).   [CF]  0128 No infiltrate or pulmonary edema. DG Chest Portable 1 View [CF]  0153 No acute abnormalities identified on CT scans of the head or cervical spine CT Head Wo Contrast [CF]  0153 Patient is awake and alert and urinating but still appears clinically intoxicated  [CF]  0455 I evaluated the patient's forehead and I see only abrasions, no evidence of a laceration requiring repair.  Patient is still sleeping comfortably.  [CF]    Clinical Course User Index [CF] Hinda Kehr, MD     Final diagnoses:  Alcoholic intoxication with complication ([place])  Hypothermia, initial encounter  Forehead abrasion, initial encounter  Minor head injury, initial encounter      Hinda Kehr, MD 09/15/17 [phone]

## 2017-09-15 NOTE — ED Notes (Signed)
Dr. Archie Balboa aware of patient's BP at time of D/C. This RN instructed patient and family to follow up with PCP/find PCP as listed on D/C instructions. Pt and family state understanding.

## 2017-09-15 NOTE — ED Notes (Signed)
This RN called [name] [phone]). This RN spoke with Henrene Pastor and his wife, states they will be here shortly to pick patient up. Patient updated at this time.

## 2017-09-15 NOTE — ED Notes (Signed)
Attempted to call patient's sober ride Henrene Pastor [phone]). Left HIPPA compliant voicemail for callback regarding picking patient up. Will attempt again at a later time.

## 2017-09-15 NOTE — ED Notes (Addendum)
NAD noted at time of D/C. Pt D/C into the care of his Aunt, Tonna Corner. Pt remains neurologically intact, no new bleeding from abrasions on his head. Pt and family state understanding of D/C instructions. Pt's Aunt Tonna Corner signed for patients D/C instructions. Pt D/C in blue paper scrubs due to his personal clothing being soiled.

## 2017-09-15 NOTE — Discharge Instructions (Signed)
You were seen in the emergency department for alcohol intoxication and a head injury.  Please keep your abrasions clean and dry.  Please seek help from the recommended resources for assistance with your alcohol dependence.  If you have any thoughts of hurting herself or others, please call 911 or return to the emergency department.  Please avoid drug and alcohol use.  Never drive a vehicle or operate machinery while intoxicated.

## 2018-01-09 ENCOUNTER — Emergency Department
Admission: EM | Admit: 2018-01-09 | Discharge: 2018-01-09 | Disposition: A | Discharge status: Home / Self Care | Payer: Medicare Other | Attending: Emergency Medicine | Admitting: Emergency Medicine

## 2018-01-09 ENCOUNTER — Other Ambulatory Visit: Payer: Self-pay

## 2018-01-09 ENCOUNTER — Encounter: Payer: Self-pay | Admitting: Emergency Medicine

## 2018-01-09 DIAGNOSIS — F10920 Alcohol use, unspecified with intoxication, uncomplicated: Secondary | ICD-10-CM | POA: Diagnosis present

## 2018-01-09 LAB — CBC WITH DIFFERENTIAL/PLATELET
BASOS ABS: 0 10*3/uL (ref 0–0.1)
Basophils Relative: 1 %
Eosinophils Absolute: 0.1 10*3/uL (ref 0–0.7)
Eosinophils Relative: 4 %
HEMATOCRIT: 44.4 % (ref 40.0–52.0)
HEMOGLOBIN: 15.1 g/dL (ref 13.0–18.0)
Lymphocytes Relative: 49 %
Lymphs Abs: 1.7 10*3/uL (ref 1.0–3.6)
MCH: 29.8 pg (ref 26.0–34.0)
MCHC: 34 g/dL (ref 32.0–36.0)
MCV: 87.5 fL (ref 80.0–100.0)
MONO ABS: 0.4 10*3/uL (ref 0.2–1.0)
MONOS PCT: 10 %
NEUTROS ABS: 1.2 10*3/uL — AB (ref 1.4–6.5)
NEUTROS PCT: 36 %
Platelets: 157 10*3/uL (ref 150–440)
RBC: 5.08 MIL/uL (ref 4.40–5.90)
RDW: 13.6 % (ref 11.5–14.5)
WBC: 3.4 10*3/uL — ABNORMAL LOW (ref 3.8–10.6)

## 2018-01-09 LAB — ETHANOL: ALCOHOL ETHYL (B): 254 mg/dL — AB (ref <10)

## 2018-01-09 LAB — COMPREHENSIVE METABOLIC PANEL
ALBUMIN: 4.1 g/dL (ref 3.5–5.0)
ALT: 44 U/L (ref 17–63)
AST: 57 U/L — AB (ref 15–41)
Alkaline Phosphatase: 61 U/L (ref 38–126)
Anion gap: 10 (ref 5–15)
BUN: 7 mg/dL (ref 6–20)
CO2: 22 mmol/L (ref 22–32)
Calcium: 8.8 mg/dL — ABNORMAL LOW (ref 8.9–10.3)
Chloride: 104 mmol/L (ref 101–111)
Creatinine, Ser: 0.78 mg/dL (ref 0.61–1.24)
GFR calc Af Amer: >60 mL/min (ref >60)
GFR calc non Af Amer: >60 mL/min (ref >60)
Glucose, Bld: 98 mg/dL (ref 65–99)
POTASSIUM: 3.4 mmol/L — AB (ref 3.5–5.1)
Sodium: 136 mmol/L (ref 135–145)
Total Bilirubin: 0.7 mg/dL (ref 0.3–1.2)
Total Protein: 8.1 g/dL (ref 6.5–8.1)

## 2018-01-09 LAB — URINE DRUG SCREEN, QUALITATIVE (ARMC ONLY)
AMPHETAMINES, UR SCREEN: NOT DETECTED
BENZODIAZEPINE, UR SCRN: NOT DETECTED
Barbiturates, Ur Screen: NOT DETECTED
COCAINE METABOLITE, UR ~~LOC~~: NOT DETECTED
Cannabinoid 50 Ng, Ur ~~LOC~~: NOT DETECTED
MDMA (Ecstasy)Ur Screen: NOT DETECTED
METHADONE SCREEN, URINE: NOT DETECTED
OPIATE, UR SCREEN: NOT DETECTED
PHENCYCLIDINE (PCP) UR S: NOT DETECTED
Tricyclic, Ur Screen: NOT DETECTED

## 2018-01-09 MED ORDER — HALOPERIDOL LACTATE 5 MG/ML IJ SOLN
5.0000 mg | Freq: Once | INTRAMUSCULAR | Status: AC
  Administered 2018-01-09: 5 mg via INTRAMUSCULAR

## 2018-01-09 MED ORDER — HALOPERIDOL LACTATE 5 MG/ML IJ SOLN
INTRAMUSCULAR | Status: AC
  Administered 2018-01-09: 5 mg via INTRAMUSCULAR
  Filled 2018-01-09: qty 1

## 2018-01-09 MED ORDER — LORAZEPAM 2 MG/ML IJ SOLN
2.0000 mg | Freq: Once | INTRAMUSCULAR | Status: AC
  Administered 2018-01-09: 2 mg via INTRAMUSCULAR

## 2018-01-09 MED ORDER — LORAZEPAM 2 MG/ML IJ SOLN
INTRAMUSCULAR | Status: AC
  Administered 2018-01-09: 2 mg via INTRAMUSCULAR
  Filled 2018-01-09: qty 1

## 2018-01-09 NOTE — ED Notes (Signed)
Pt discharged to lobby (ride was waiting). All belongings sent with patient. IVC rescinded.

## 2018-01-09 NOTE — ED Provider Notes (Addendum)
[facility] Emergency Department Provider Note   ____________________________________________   First MD Initiated Contact with Patient 01/09/18 1413     (approximate)  I have reviewed the triage vital signs and the nursing notes.   HISTORY  Chief Complaint Alcohol Intoxication    HPI Martin Diaz is a 57 y.o. male Who apparently told his body he wanted to come to the emergency room so they called the ambulance. Patient had been pushing a lawnmower down the side of the road while he was intoxicated. Patient comes to emergency room unsteady on his feet with slurry speech. He smells of alcohol. When he gets here he wants to go back home but he does not know any phone number which will contact someone he will come to get him. He is too intoxicated to walk straight. He will not sit down and insists on continuing to try to leave. I therefore take out commitment papers on him until such time as he sobers up.patient continues to try to leave and will not listen to myself and nurses or the officers. He is therefore given 2 of Ativan and 5 of Haldol to him to relax. Patient cannot give [place] any medicines asked medical history or allergies.   History reviewed. No pertinent past medical history.  There are no active problems to display for this patient.   History reviewed. No pertinent surgical history.  Prior to Admission medications   Not on File    Allergies Patient has no known allergies.  No family history on file.  Social History Social History   Tobacco Use  . Smoking status: Never Smoker  . Smokeless tobacco: Never Used  Substance Use Topics  . Alcohol use: Yes  . Drug use: Not Currently    Review of Systems as near as I can tell: Constitutional: No fever/chills Eyes: No visual changes. ENT: No sore throat. Cardiovascular: Denies chest pain. Respiratory: Denies shortness of breath. Gastrointestinal: No abdominal pain.  No nausea, no  vomiting.  No diarrhea.  No constipation. Genitourinary: Negative for dysuria. Musculoskeletal: Negative for back pain. Skin: Negative for rash. Neurological: Negative for headaches, focal weakness  ____________________________________________   PHYSICAL EXAM:  VITAL SIGNS: ED Triage Vitals [01/09/18 1405]  Enc Vitals Group     BP      Pulse      Resp      Temp      Temp src      SpO2      Weight      Height      Head Circumference      Peak Flow      Pain Score 0     Pain Loc      Pain Edu?      Excl. in [place]?     Constitutional: Alert and oriented.intoxicated but Well appearing and in no acute distress. Eyes: Conjunctivae are normal. PERRL. EOMI. Head: Atraumatic. Nose: No congestion/rhinnorhea. Mouth/Throat: Mucous membranes are moist.  Oropharynx non-erythematous. Neck: No stridor.  Cardiovascular: Normal rate, regular rhythm. Grossly normal heart sounds.  Good peripheral circulation. Respiratory: Normal respiratory effort.  No retractions. Lungs CTAB. Gastrointestinal: Soft and nontender. No distention. No abdominal bruits. No CVA tenderness. Musculoskeletal: No lower extremity tenderness nor edema.  No joint effusions. Neurologic:  Normal speech and language. No gross focal neurologic deficits are appreciated. patient is unable to walk straight Skin:  Skin is warm, dry and intact. No rash noted. Psychiatric: Mood and affect are normal. Speech is  a slurry and behavior is as you would expect from an intoxicated person  ____________________________________________   LABS (all labs ordered are listed, but only abnormal results are displayed)  Labs Reviewed  COMPREHENSIVE METABOLIC PANEL  ETHANOL  CBC WITH DIFFERENTIAL/PLATELET  URINE DRUG SCREEN, QUALITATIVE ([place])   ____________________________________________  EKG   ____________________________________________  RADIOLOGY  ED MD interpretation:    Official radiology report(s): No results  found.  ____________________________________________   PROCEDURES  Procedure(s) performed:   Procedures  Critical Care performed:   ____________________________________________   INITIAL IMPRESSION / ASSESSMENT AND PLAN / ED COURSE  we attempted to call several phone numbers patient came up with but they were all either disconnected or the person said it was a wrong number. Patient handed the phone book but does not want to use it has the phone does not want to use that either we will keep him until he is hopefully sobers up.   ----------------------------------------- 3:44 PM on 01/09/2018 -----------------------------------------  Patient's right has come he is doing somewhat better but now he is somebody to supervise him I will let him go.      ____________________________________________   FINAL CLINICAL IMPRESSION(S) / ED DIAGNOSES  Final diagnoses:  Alcoholic intoxication without complication [facility])     ED Discharge Orders    None       Note:  This document was prepared using Dragon voice recognition software and may include unintentional dictation errors.    Nena Polio, MD 01/09/18 1507    Nena Polio, MD 01/09/18 [phone]

## 2018-01-09 NOTE — Discharge Instructions (Addendum)
Please return for any further problems °

## 2018-01-09 NOTE — ED Triage Notes (Signed)
Pt to ED via EMS, was found pushing lawn mower intoxicated. PT is Alert but unsteady on feet. VSS . Pt has abrasion on lower leg noted. PT denies any pain.

## 2018-01-09 NOTE — ED Notes (Signed)
Pt alert for blood draw. Labs successfully drawn by medic. Maintained on 15 minute checks and observation by security camera for safety.

## 2018-01-09 NOTE — ED Notes (Signed)
IVC  PAPERS  RESCINDED  PER  DR Cinda Quest MD  INFORMED  RN  AMY

## 2018-01-09 NOTE — ED Notes (Signed)
Pt's ride is waiting in the lobby. Pt to be discharged.

## 2018-01-09 NOTE — ED Notes (Signed)
Pt refuses to sit down in bed, states he wants to leave. Pt unsteady gait, slurred speech noted. Pt states he had 2 beers today. MD at bedside

## 2019-06-10 ENCOUNTER — Inpatient Hospital Stay
Admission: EM | Admit: 2019-06-10 | Discharge: 2019-06-27 | DRG: 682 | Disposition: A | Discharge status: Skilled Nursing Facility | Payer: Medicare Other | Attending: Internal Medicine | Admitting: Internal Medicine

## 2019-06-10 ENCOUNTER — Other Ambulatory Visit: Payer: Self-pay

## 2019-06-10 ENCOUNTER — Inpatient Hospital Stay: Payer: Medicare Other

## 2019-06-10 ENCOUNTER — Emergency Department: Payer: Medicare Other

## 2019-06-10 ENCOUNTER — Encounter: Payer: Self-pay | Admitting: Emergency Medicine

## 2019-06-10 DIAGNOSIS — G9341 Metabolic encephalopathy: Secondary | ICD-10-CM | POA: Diagnosis present

## 2019-06-10 DIAGNOSIS — K7200 Acute and subacute hepatic failure without coma: Secondary | ICD-10-CM

## 2019-06-10 DIAGNOSIS — E876 Hypokalemia: Secondary | ICD-10-CM | POA: Diagnosis present

## 2019-06-10 DIAGNOSIS — Z7289 Other problems related to lifestyle: Secondary | ICD-10-CM

## 2019-06-10 DIAGNOSIS — E8770 Fluid overload, unspecified: Secondary | ICD-10-CM | POA: Diagnosis present

## 2019-06-10 DIAGNOSIS — D631 Anemia in chronic kidney disease: Secondary | ICD-10-CM | POA: Diagnosis present

## 2019-06-10 DIAGNOSIS — K852 Alcohol induced acute pancreatitis without necrosis or infection: Secondary | ICD-10-CM

## 2019-06-10 DIAGNOSIS — E869 Volume depletion, unspecified: Secondary | ICD-10-CM | POA: Diagnosis present

## 2019-06-10 DIAGNOSIS — I129 Hypertensive chronic kidney disease with stage 1 through stage 4 chronic kidney disease, or unspecified chronic kidney disease: Secondary | ICD-10-CM | POA: Diagnosis present

## 2019-06-10 DIAGNOSIS — N184 Chronic kidney disease, stage 4 (severe): Secondary | ICD-10-CM | POA: Diagnosis present

## 2019-06-10 DIAGNOSIS — F10239 Alcohol dependence with withdrawal, unspecified: Secondary | ICD-10-CM | POA: Diagnosis present

## 2019-06-10 DIAGNOSIS — E872 Acidosis, unspecified: Secondary | ICD-10-CM

## 2019-06-10 DIAGNOSIS — R109 Unspecified abdominal pain: Secondary | ICD-10-CM

## 2019-06-10 DIAGNOSIS — N179 Acute kidney failure, unspecified: Secondary | ICD-10-CM | POA: Diagnosis present

## 2019-06-10 DIAGNOSIS — Z781 Physical restraint status: Secondary | ICD-10-CM | POA: Diagnosis not present

## 2019-06-10 DIAGNOSIS — J984 Other disorders of lung: Secondary | ICD-10-CM

## 2019-06-10 DIAGNOSIS — I160 Hypertensive urgency: Secondary | ICD-10-CM | POA: Diagnosis present

## 2019-06-10 DIAGNOSIS — F10921 Alcohol use, unspecified with intoxication delirium: Secondary | ICD-10-CM | POA: Diagnosis not present

## 2019-06-10 DIAGNOSIS — R531 Weakness: Secondary | ICD-10-CM | POA: Diagnosis present

## 2019-06-10 DIAGNOSIS — N281 Cyst of kidney, acquired: Secondary | ICD-10-CM | POA: Diagnosis present

## 2019-06-10 DIAGNOSIS — M6282 Rhabdomyolysis: Secondary | ICD-10-CM | POA: Diagnosis present

## 2019-06-10 DIAGNOSIS — N19 Unspecified kidney failure: Secondary | ICD-10-CM

## 2019-06-10 DIAGNOSIS — Z452 Encounter for adjustment and management of vascular access device: Secondary | ICD-10-CM

## 2019-06-10 DIAGNOSIS — Z789 Other specified health status: Secondary | ICD-10-CM

## 2019-06-10 DIAGNOSIS — Z79899 Other long term (current) drug therapy: Secondary | ICD-10-CM

## 2019-06-10 DIAGNOSIS — F1090 Alcohol use, unspecified, uncomplicated: Secondary | ICD-10-CM

## 2019-06-10 DIAGNOSIS — E871 Hypo-osmolality and hyponatremia: Secondary | ICD-10-CM | POA: Diagnosis present

## 2019-06-10 DIAGNOSIS — F79 Unspecified intellectual disabilities: Secondary | ICD-10-CM | POA: Diagnosis present

## 2019-06-10 DIAGNOSIS — Z20828 Contact with and (suspected) exposure to other viral communicable diseases: Secondary | ICD-10-CM | POA: Diagnosis present

## 2019-06-10 DIAGNOSIS — N170 Acute kidney failure with tubular necrosis: Secondary | ICD-10-CM | POA: Diagnosis not present

## 2019-06-10 HISTORY — DX: Other problems related to lifestyle: Z72.89

## 2019-06-10 HISTORY — DX: Other specified health status: Z78.9

## 2019-06-10 LAB — COMPREHENSIVE METABOLIC PANEL
ALT: 237 U/L — ABNORMAL HIGH (ref 0–44)
AST: 450 U/L — ABNORMAL HIGH (ref 15–41)
Albumin: 3.5 g/dL (ref 3.5–5.0)
Alkaline Phosphatase: 77 U/L (ref 38–126)
Anion gap: 23 — ABNORMAL HIGH (ref 5–15)
BUN: 188 mg/dL — ABNORMAL HIGH (ref 6–20)
CO2: 17 mmol/L — ABNORMAL LOW (ref 22–32)
Calcium: 6.4 mg/dL — CL (ref 8.9–10.3)
Chloride: 90 mmol/L — ABNORMAL LOW (ref 98–111)
Creatinine, Ser: 11.56 mg/dL — ABNORMAL HIGH (ref 0.61–1.24)
GFR calc Af Amer: 5 mL/min — ABNORMAL LOW (ref >60)
GFR calc non Af Amer: 4 mL/min — ABNORMAL LOW (ref >60)
Glucose, Bld: 130 mg/dL — ABNORMAL HIGH (ref 70–99)
Potassium: 4.4 mmol/L (ref 3.5–5.1)
Sodium: 130 mmol/L — ABNORMAL LOW (ref 135–145)
Total Bilirubin: 1 mg/dL (ref 0.3–1.2)
Total Protein: 7.9 g/dL (ref 6.5–8.1)

## 2019-06-10 LAB — AMMONIA: Ammonia: 22 umol/L (ref 9–35)

## 2019-06-10 LAB — CBC
HCT: 44.6 % (ref 39.0–52.0)
Hemoglobin: 15.2 g/dL (ref 13.0–17.0)
MCH: 28.4 pg (ref 26.0–34.0)
MCHC: 34.1 g/dL (ref 30.0–36.0)
MCV: 83.4 fL (ref 80.0–100.0)
Platelets: 201 10*3/uL (ref 150–400)
RBC: 5.35 MIL/uL (ref 4.22–5.81)
RDW: 12 % (ref 11.5–15.5)
WBC: 9.3 10*3/uL (ref 4.0–10.5)
nRBC: 0 % (ref 0.0–0.2)

## 2019-06-10 LAB — ETHANOL: Alcohol, Ethyl (B): <10 mg/dL (ref <10)

## 2019-06-10 LAB — LIPASE, BLOOD: Lipase: 339 U/L — ABNORMAL HIGH (ref 11–51)

## 2019-06-10 LAB — PROTIME-INR
INR: 1.3 — ABNORMAL HIGH (ref 0.8–1.2)
Prothrombin Time: 15.8 seconds — ABNORMAL HIGH (ref 11.4–15.2)

## 2019-06-10 IMAGING — US US ABDOMEN LIMITED
1 series · 14 of 25 positions shown · non-contrast
Comparison: None.

CLINICAL DATA: Abdominal pain.  Elevated lipase.

EXAM:
ULTRASOUND ABDOMEN LIMITED RIGHT UPPER QUADRANT

[Series 1: us abdomen limited · 14 of 41 slices shown]
[im 1/41]
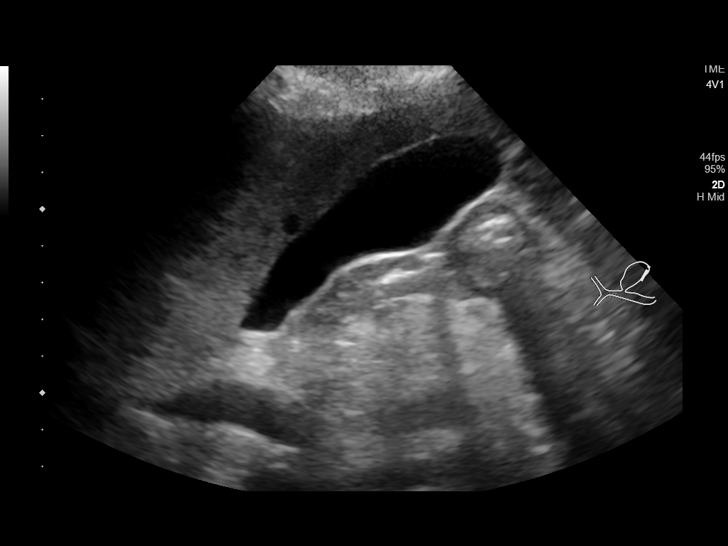
[im 4/41]
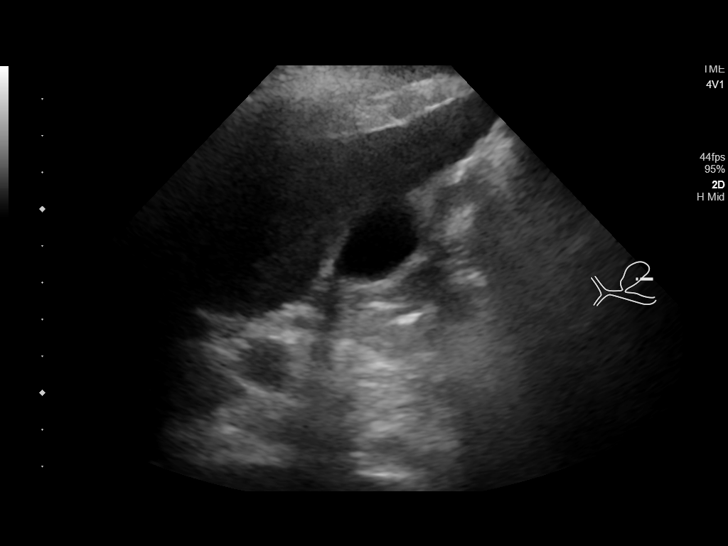
[im 7/41]
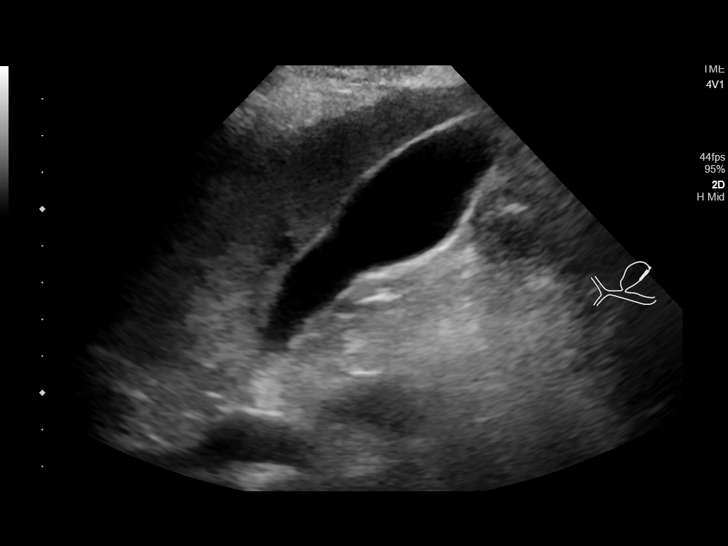
[im 11/41]
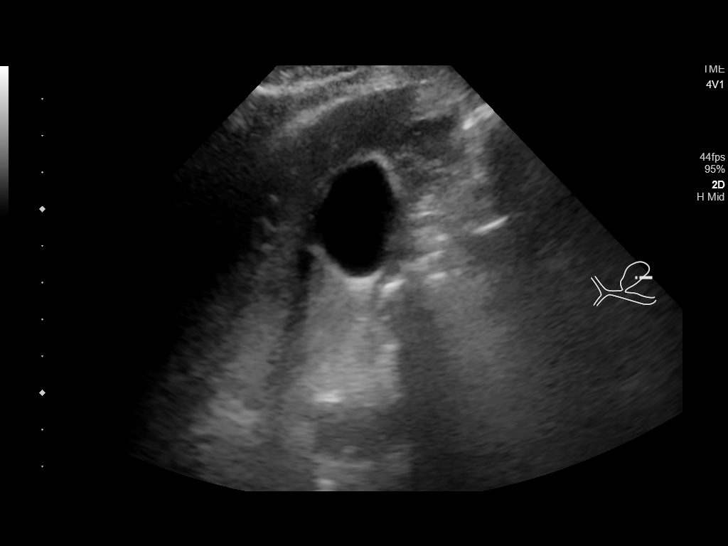
[im 14/41]
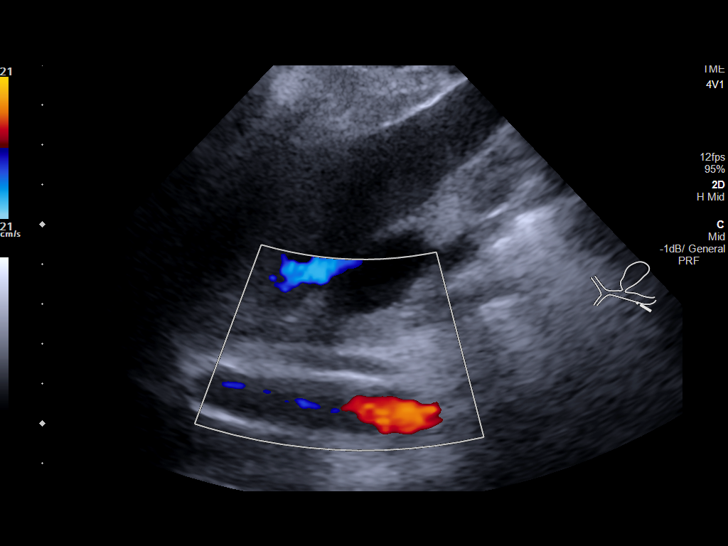
[im 16/41]
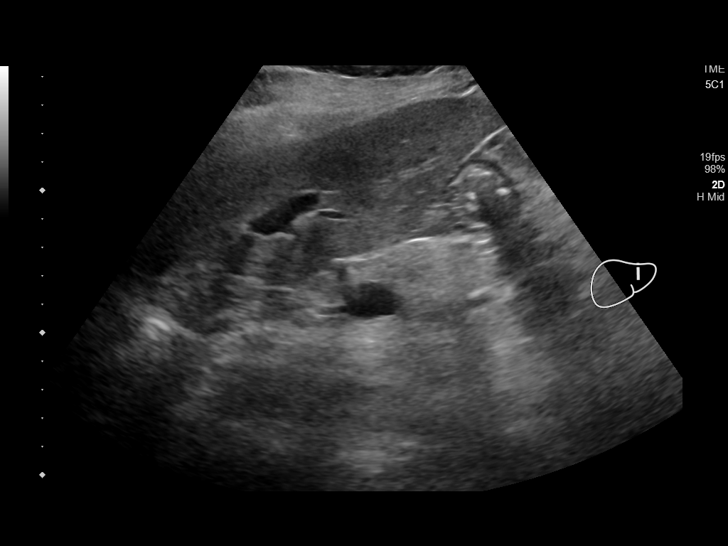
[im 19/41]
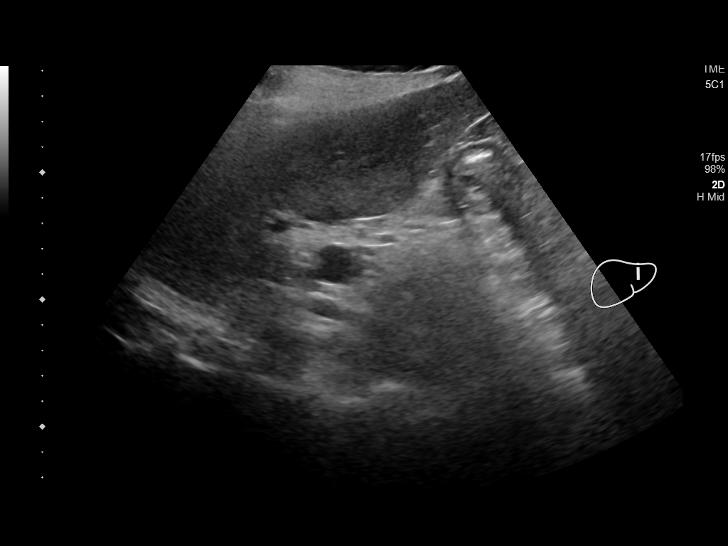
[im 22/41]
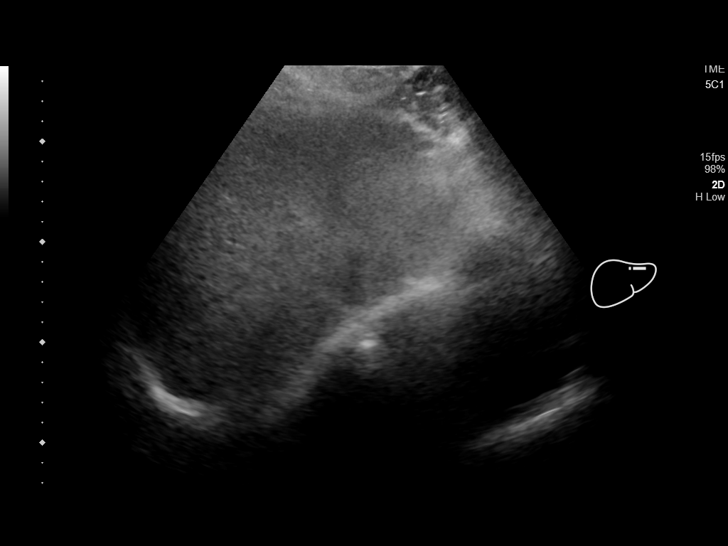
[im 26/41]
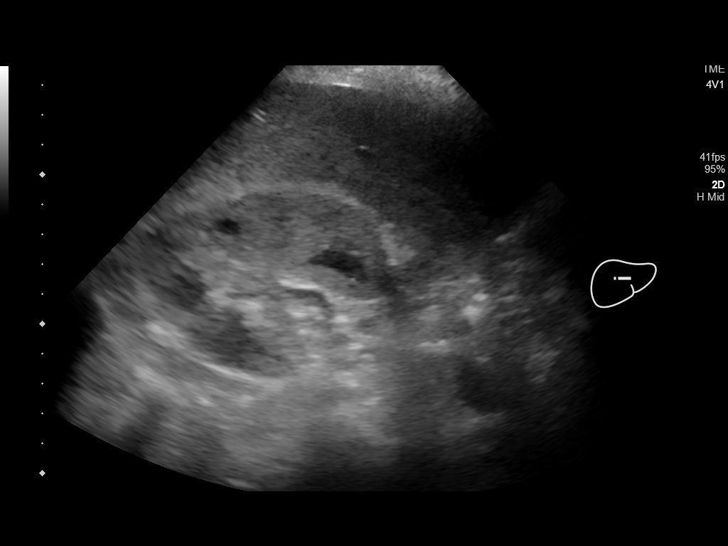
[im 27/41]
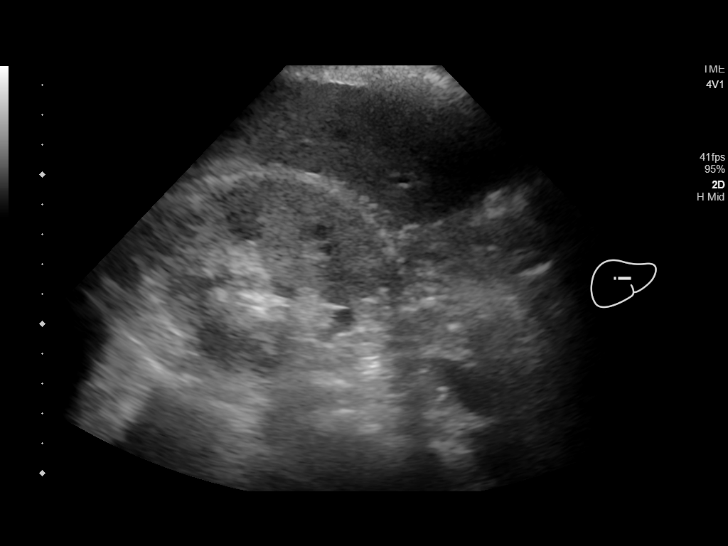
[im 31/41]
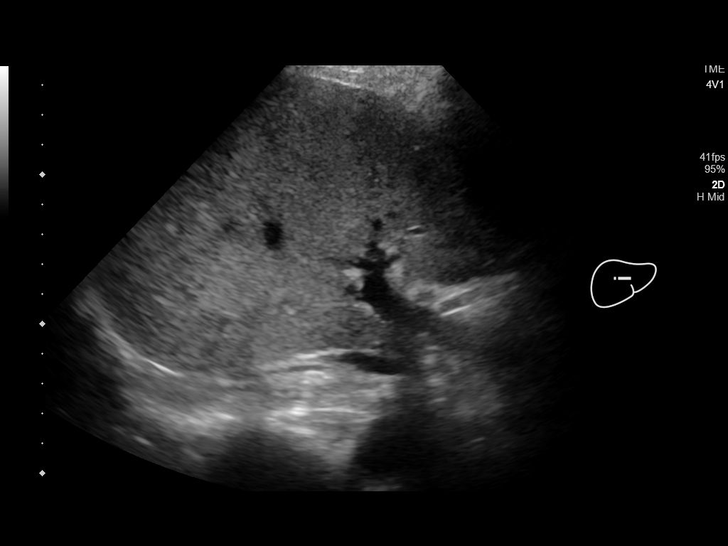
[im 34/41]
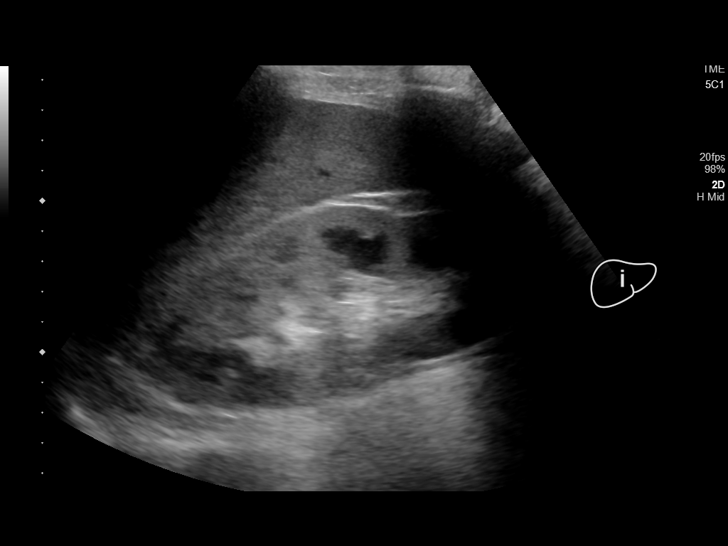
[im 37/41]
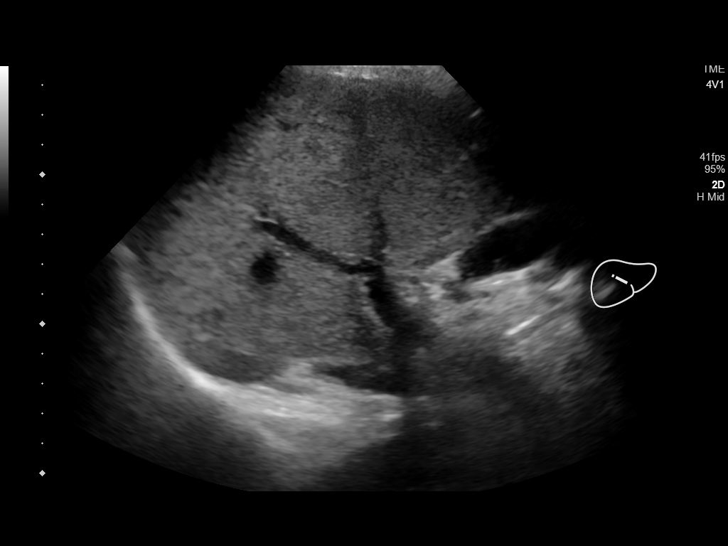
[im 41/41]
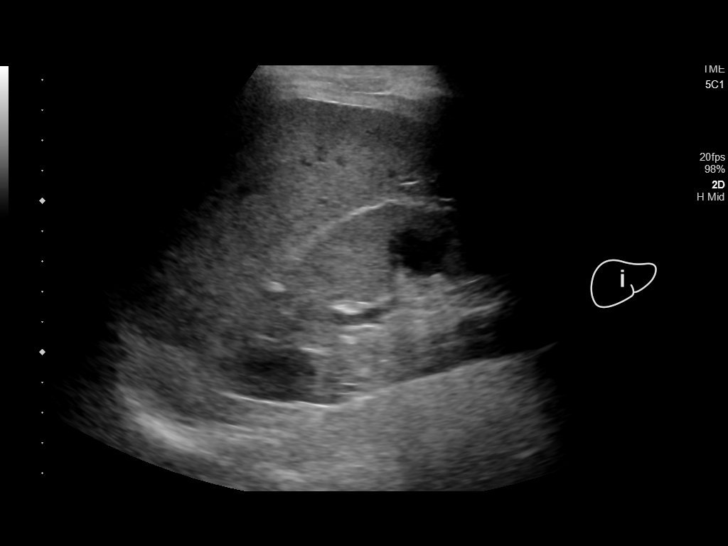

[14 of 25 positions shown; findings below may reference images not displayed]

FINDINGS: Gallbladder:

No gallstones or wall thickening visualized. No sonographic Murphy
sign noted by sonographer.

Common bile duct:

Diameter: 5 mm, within normal limits.

Liver:

No focal lesion identified. Within normal limits in parenchymal
echogenicity. Portal vein is patent on color Doppler imaging with
normal direction of blood flow towards the liver.

Other: Increased echogenicity of right renal parenchyma noted,
consistent with medical renal disease.
IMPRESSION: No evidence of gallstones or other hepatobiliary abnormality.

Increased parenchymal echogenicity of right kidney, consistent with
medical renal disease.

## 2019-06-10 IMAGING — US US RENAL
1 series · 14 of 25 positions shown · non-contrast
Comparison: None at

CLINICAL DATA: Renal failure

EXAM:
RENAL / URINARY TRACT ULTRASOUND COMPLETE

[Series 1: us renal · 14 of 45 slices shown]
[im 1/45]
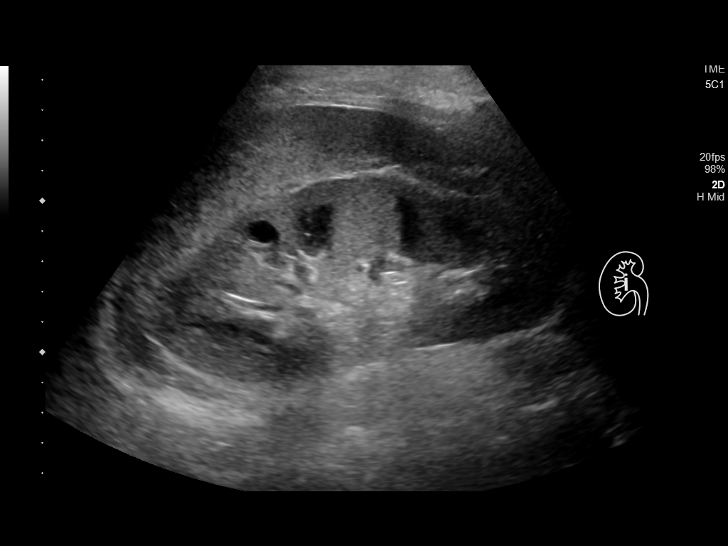
[im 4/45]
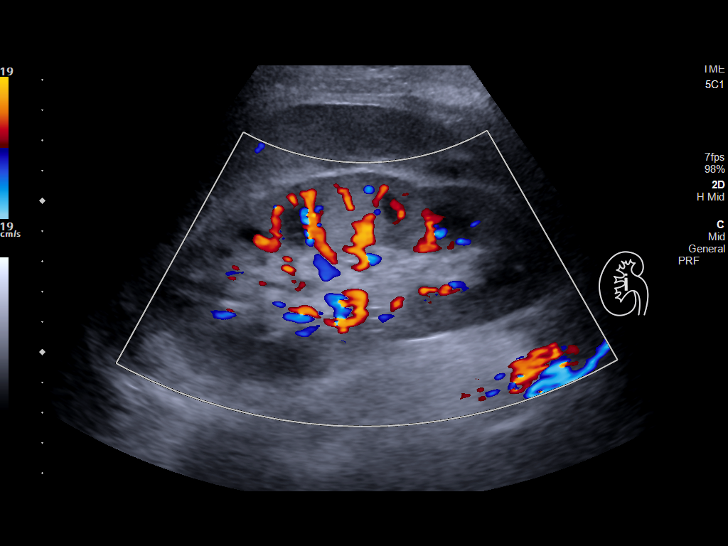
[im 8/45]
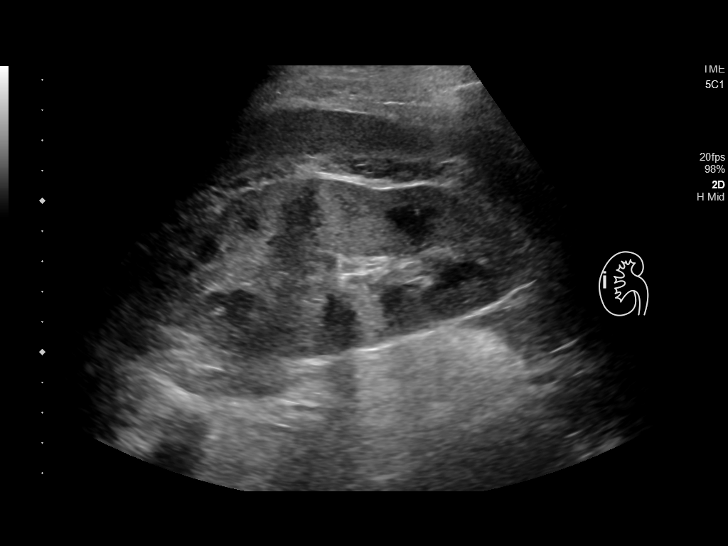
[im 12/45]
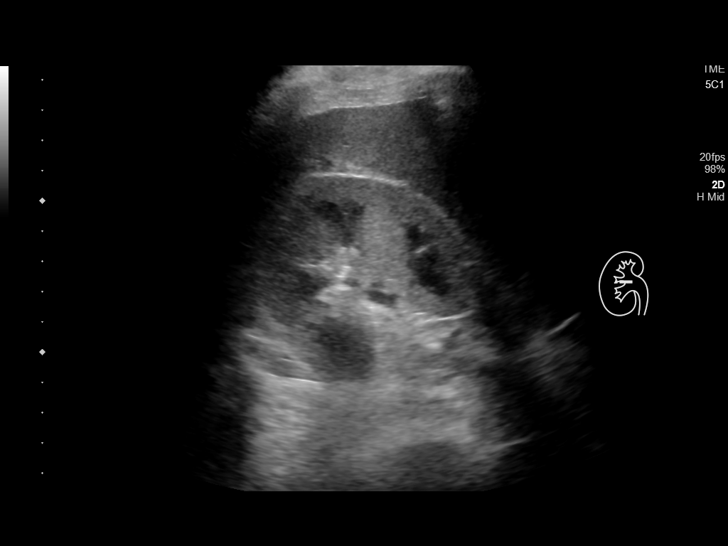
[im 15/45]
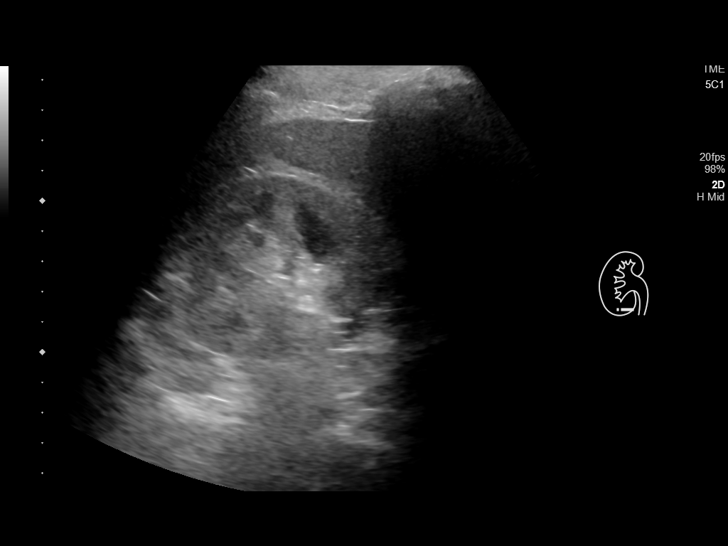
[im 17/45]
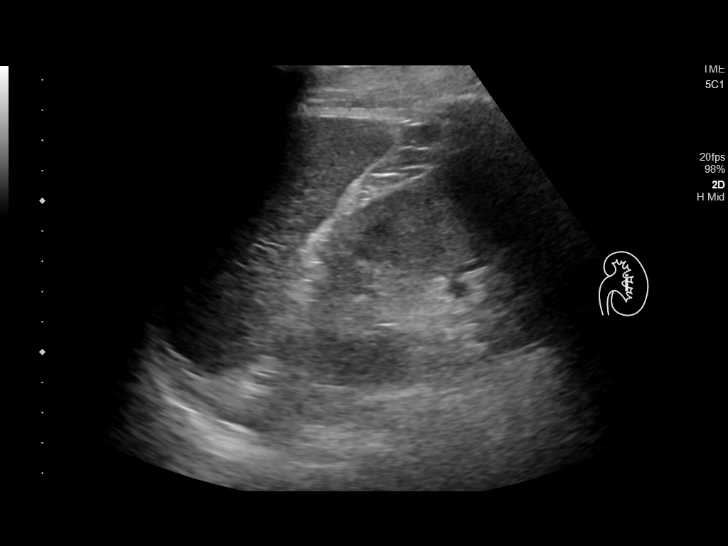
[im 21/45]
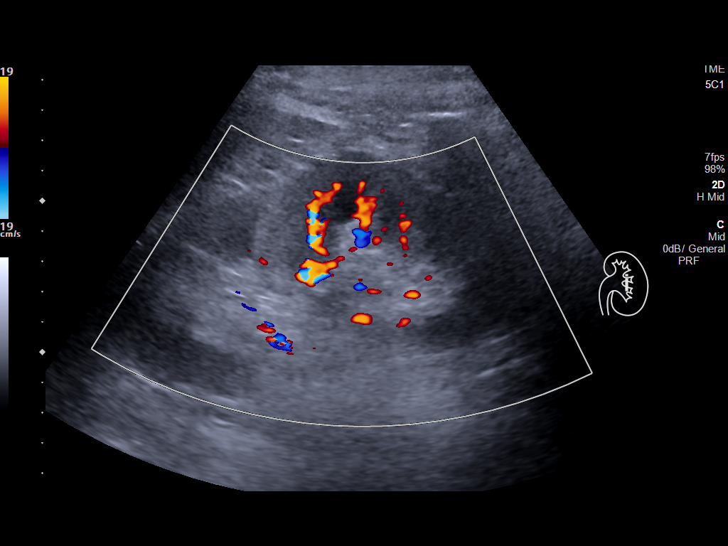
[im 24/45]
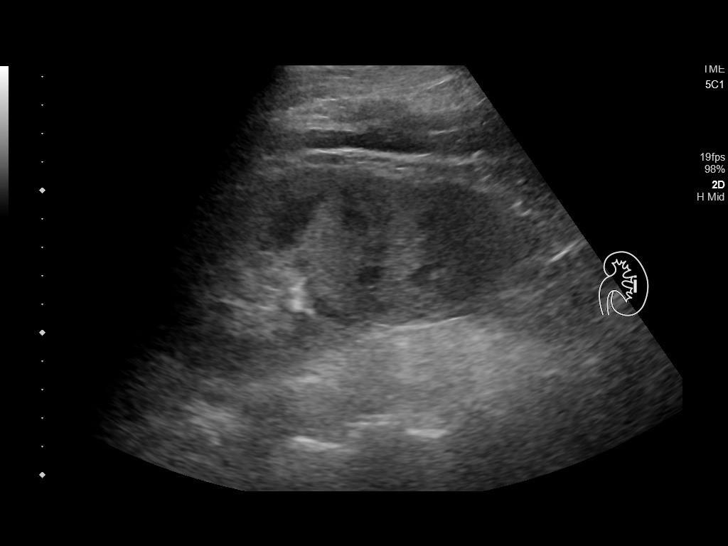
[im 28/45]
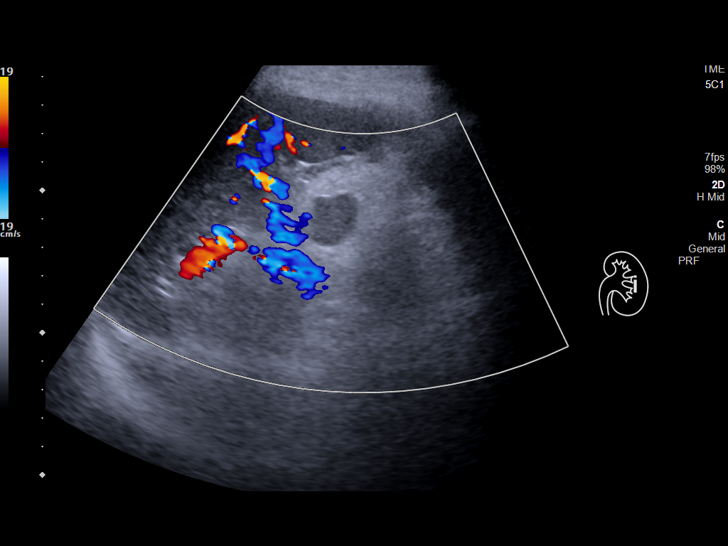
[im 30/45]
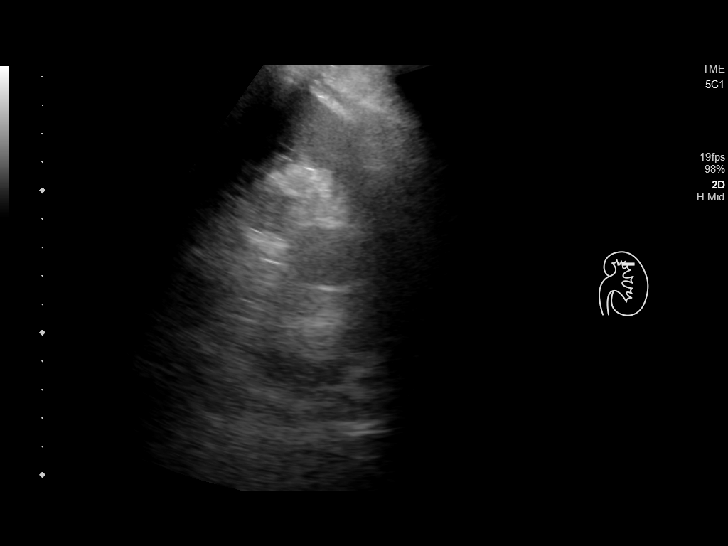
[im 34/45]
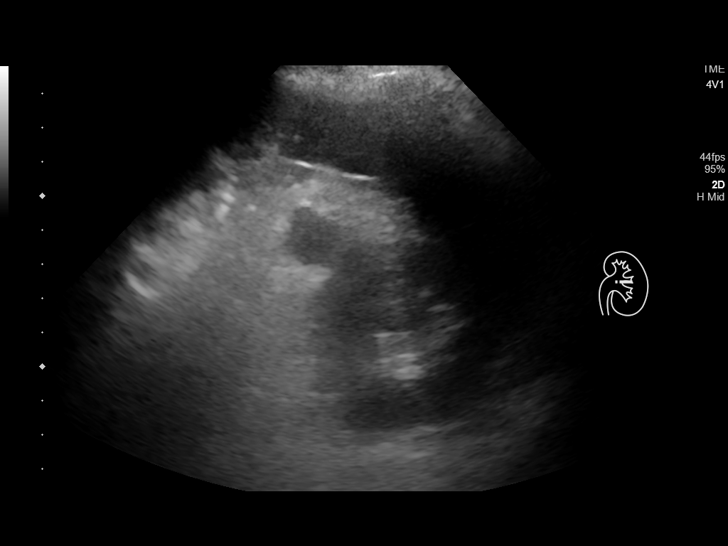
[im 37/45]
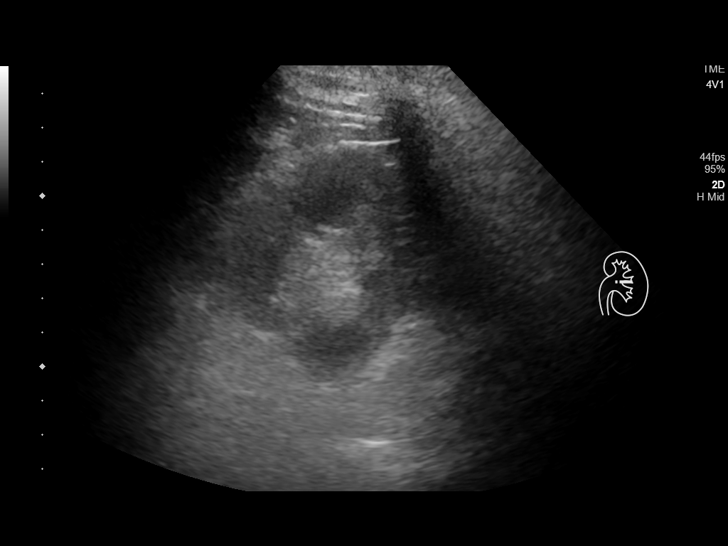
[im 41/45]
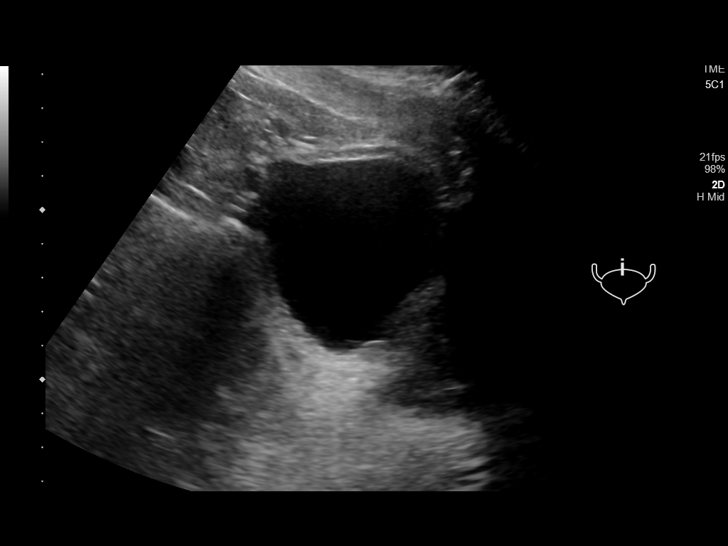
[im 45/45]
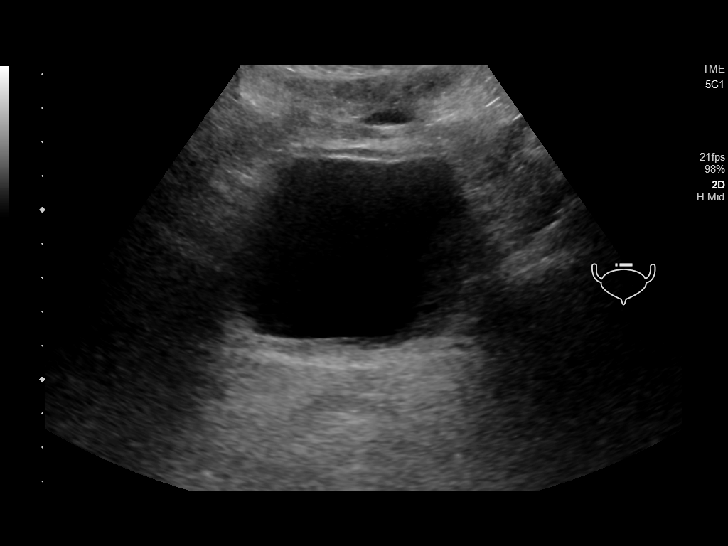

[14 of 25 positions shown; findings below may reference images not displayed]

FINDINGS: Right Kidney:

Renal measurements: 13.2 x 7.4 x 6.4 cm = volume: 325 mL. Normal
cortical thickness. Increased cortical echogenicity. No mass,
hydronephrosis, or shadowing calcification.

Left Kidney:

Renal measurements: 14.4 x 5.9 x 5.6 cm = volume: 248 mL. Normal
cortical thickness. Increased cortical echogenicity. Exophytic
nodule from lateral upper to mid LEFT kidney 2.2 x 1.7 x 1.8 cm,
containing low level internal echoes, question solid versus complex
cystic. No additional mass, hydronephrosis or shadowing
calcification.

Bladder:

Appears normal for degree of bladder distention.

Other:

N/A
IMPRESSION: Medical renal disease changes of both kidneys.

Exophytic indeterminate nodule lateral aspect of upper to mid LEFT
kidney 2.2 cm greatest size; this could represent a solid or a
complex cystic lesion and further characterization by MR imaging is
recommended.

## 2019-06-10 MED ORDER — SODIUM CHLORIDE 0.9 % IV SOLN
INTRAVENOUS | Status: DC
  Administered 2019-06-11 (×2): via INTRAVENOUS

## 2019-06-10 MED ORDER — FOLIC ACID 1 MG PO TABS
1.0000 mg | ORAL_TABLET | Freq: Every day | ORAL | Status: DC
  Administered 2019-06-11: 1 mg via ORAL
  Filled 2019-06-10 (×2): qty 1

## 2019-06-10 MED ORDER — HEPARIN SODIUM (PORCINE) 5000 UNIT/ML IJ SOLN
5000.0000 [IU] | Freq: Three times a day (TID) | INTRAMUSCULAR | Status: DC
  Administered 2019-06-11 – 2019-06-27 (×37): 5000 [IU] via SUBCUTANEOUS
  Filled 2019-06-10 (×40): qty 1

## 2019-06-10 MED ORDER — CALCIUM GLUCONATE-NACL 1-0.675 GM/50ML-% IV SOLN
1.0000 g | Freq: Once | INTRAVENOUS | Status: AC
  Administered 2019-06-10: 1000 mg via INTRAVENOUS
  Filled 2019-06-10: qty 50

## 2019-06-10 MED ORDER — SODIUM CHLORIDE 0.9% FLUSH: 3.0000 mL | Freq: Once | INTRAVENOUS | Status: DC

## 2019-06-10 MED ORDER — VITAMIN B-1 100 MG PO TABS
100.0000 mg | ORAL_TABLET | Freq: Every day | ORAL | Status: DC
  Administered 2019-06-11: 100 mg via ORAL
  Filled 2019-06-10: qty 1

## 2019-06-10 MED ORDER — SODIUM CHLORIDE 0.9 % IV SOLN: 1.0000 g | Freq: Once | INTRAVENOUS | Status: DC

## 2019-06-10 MED ORDER — SODIUM CHLORIDE 0.9 % IV BOLUS
1000.0000 mL | Freq: Once | INTRAVENOUS | Status: AC
  Administered 2019-06-10: 1000 mL via INTRAVENOUS

## 2019-06-10 MED ORDER — SODIUM CHLORIDE 0.9% FLUSH
3.0000 mL | Freq: Two times a day (BID) | INTRAVENOUS | Status: DC
  Administered 2019-06-11 – 2019-06-25 (×20): 3 mL via INTRAVENOUS
  Administered 2019-06-26: 10 mL via INTRAVENOUS
  Administered 2019-06-26: 3 mL via INTRAVENOUS
  Administered 2019-06-27: 10 mL via INTRAVENOUS

## 2019-06-10 MED ORDER — LORAZEPAM 1 MG PO TABS: 1.0000 mg | ORAL_TABLET | ORAL | Status: AC | PRN

## 2019-06-10 MED ORDER — ONDANSETRON HCL 4 MG/2ML IJ SOLN: 4.0000 mg | Freq: Four times a day (QID) | INTRAMUSCULAR | Status: DC | PRN

## 2019-06-10 MED ORDER — ADULT MULTIVITAMIN W/MINERALS CH
1.0000 | ORAL_TABLET | Freq: Every day | ORAL | Status: DC
  Administered 2019-06-11: 1 via ORAL
  Filled 2019-06-10 (×2): qty 1

## 2019-06-10 MED ORDER — LORAZEPAM 2 MG/ML IJ SOLN
1.0000 mg | INTRAMUSCULAR | Status: AC | PRN
  Administered 2019-06-11: 4 mg via INTRAVENOUS
  Administered 2019-06-12 – 2019-06-13 (×4): 2 mg via INTRAVENOUS
  Filled 2019-06-10: qty 2
  Filled 2019-06-10: qty 1
  Filled 2019-06-10: qty 2
  Filled 2019-06-10: qty 1

## 2019-06-10 MED ORDER — THIAMINE HCL 100 MG/ML IJ SOLN
100.0000 mg | Freq: Every day | INTRAMUSCULAR | Status: DC
  Administered 2019-06-12: 100 mg via INTRAVENOUS
  Filled 2019-06-10: qty 2

## 2019-06-10 MED ORDER — ONDANSETRON HCL 4 MG PO TABS: 4.0000 mg | ORAL_TABLET | Freq: Four times a day (QID) | ORAL | Status: DC | PRN

## 2019-06-10 NOTE — H&P (Signed)
History and Physical    Martin Diaz JJK:[id number] DOB: 09/19/1960 DOA: 06/10/2019  PCP: Patient, No Pcp Per  Patient coming from: Home  I have personally briefly reviewed patient's old medical records in [place]  Chief Complaint: Weakness, diarrhea, nausea, vomiting, shakes  HPI: Martin Diaz is a 58 y.o. male with medical history significant for alcohol use and intellectual disability who presents to the ED for evaluation of nausea, vomiting, diarrhea, generalized weakness, and shakes.  History limited from patient due to baseline intellectual disability.  History otherwise supplemented by EDP, chart review, and uncle at bedside.  Patient reportedly had several days of nausea, vomiting, diarrhea.  Today he was noticed to have new shakes in his hands and arms.  ED Course:  Initial vitals showed BP 164/103, pulse 102, RR 20, temp 98.0 Fahrenheit, SPO2 99% on room air.  Labs notable for BUN 188, creatinine 11.56, sodium 130, potassium 4.4, chloride 90, bicarb 17, serum glucose 130, corrected calcium 6.8, AST 450, ALT 237, alk phos 77, total bilirubin 1.0.  Lipase 339.  INR 1.3, ammonia 22, serum ethanol level undetectable.  RUQ ultrasound was negative for evidence of gallstones or other hepatobiliary abnormality, increased parenchymal echogenicity of the right kidney was seen.  Patient was given 1 L normal saline and 1 g calcium gluconate in the hospital service was consulted admit for further evaluation management.  Review of Systems:  Unable to obtain full review of systems due to Cognitive status.   Past Medical History:  Diagnosis Date  . Alcohol use     History reviewed. No pertinent surgical history.  Social History:  reports that he has never smoked. He has never used smokeless tobacco. He reports current alcohol use. He reports previous drug use.  No Known Allergies  History reviewed. No pertinent family history.   Prior to Admission medications    Not on File    Physical Exam: Vitals:   06/10/19 1858 06/10/19 1900 06/10/19 2100 06/10/19 2130  BP: (!) 164/103  (!) 199/106 (!) 184/95  Pulse: (!) 102  85 88  Resp: 20     Temp: 98 F (36.7 C)     TempSrc: Axillary     SpO2: 99%  (!) 81% 99%  Weight:  113.4 kg      Constitutional: Resting in bed in the left lateral decubitus position, NAD, calm, comfortable Eyes: PERRL, EOMI, lids and conjunctivae normal ENMT: Mucous membranes are moist. Posterior pharynx clear of any exudate or lesions.Normal dentition.  Neck: normal, supple, no masses. Respiratory: clear to auscultation bilaterally, no wheezing, no crackles. Normal respiratory effort. No accessory muscle use.  Cardiovascular: Regular rate and rhythm, no murmurs / rubs / gallops. No extremity edema. 2+ pedal pulses. Abdomen: no tenderness, no masses palpated. No hepatosplenomegaly. Bowel sounds positive.  Musculoskeletal: no clubbing / cyanosis. No joint deformity upper and lower extremities.  Slow movement of extremities however [place] appears intact, no contractures. Normal muscle tone.  Skin: no rashes, lesions, ulcers. No induration Neurologic: CN 2-12 grossly intact. Sensation intact, Strength 5/5 in all 4, resting tremors of both upper extremities.  Slow movement of extremities. Psychiatric: Awake and alert.  He is oriented to self, knows he is in a hospital in [place], and recognizes his uncle at bedside.  He is not aware of the year.   Labs on Admission: I have personally reviewed following labs and imaging studies  CBC: Recent Labs  Lab 06/10/19 1907  WBC 9.3  HGB 15.2  HCT 44.6  MCV 83.4  PLT 259   Basic Metabolic Panel: Recent Labs  Lab 06/10/19 1907  NA 130*  K 4.4  CL 90*  CO2 17*  GLUCOSE 130*  BUN 188*  CREATININE 11.56*  CALCIUM 6.4*   GFR: CrCl cannot be calculated (Unknown ideal weight.). Liver Function Tests: Recent Labs  Lab 06/10/19 1907  AST 450*  ALT 237*  ALKPHOS 77   BILITOT 1.0  PROT 7.9  ALBUMIN 3.5   Recent Labs  Lab 06/10/19 1907  LIPASE 339*   Recent Labs  Lab 06/10/19 2058  AMMONIA 22   Coagulation Profile: Recent Labs  Lab 06/10/19 2058  INR 1.3*   Cardiac Enzymes: No results for input(s): CKTOTAL, CKMB, CKMBINDEX, TROPONINI in the last 168 hours. BNP (last 3 results) No results for input(s): PROBNP in the last 8760 hours. HbA1C: No results for input(s): HGBA1C in the last 72 hours. CBG: No results for input(s): GLUCAP in the last 168 hours. Lipid Profile: No results for input(s): CHOL, HDL, LDLCALC, TRIG, CHOLHDL, LDLDIRECT in the last 72 hours. Thyroid Function Tests: No results for input(s): TSH, T4TOTAL, FREET4, T3FREE, THYROIDAB in the last 72 hours. Anemia Panel: No results for input(s): VITAMINB12, FOLATE, FERRITIN, TIBC, IRON, RETICCTPCT in the last 72 hours. Urine analysis:    Component Value Date/Time   COLORURINE YELLOW (A) 09/15/2017 0400   APPEARANCEUR CLEAR (A) 09/15/2017 0400   LABSPEC 1.013 09/15/2017 0400   PHURINE 5.0 09/15/2017 0400   GLUCOSEU NEGATIVE 09/15/2017 0400   HGBUR NEGATIVE 09/15/2017 0400   BILIRUBINUR NEGATIVE 09/15/2017 0400   KETONESUR 5 (A) 09/15/2017 0400   PROTEINUR NEGATIVE 09/15/2017 0400   NITRITE NEGATIVE 09/15/2017 0400   LEUKOCYTESUR NEGATIVE 09/15/2017 0400    Radiological Exams on Admission: US Abdomen Limited Ruq  Result Date: 06/10/2019 CLINICAL DATA:  Abdominal pain.  Elevated lipase. EXAM: ULTRASOUND ABDOMEN LIMITED RIGHT UPPER QUADRANT COMPARISON:  None. FINDINGS: Gallbladder: No gallstones or wall thickening visualized. No sonographic Murphy sign noted by sonographer. Common bile duct: Diameter: 5 mm, within normal limits. Liver: No focal lesion identified. Within normal limits in parenchymal echogenicity. Portal vein is patent on color Doppler imaging with normal direction of blood flow towards the liver. Other: Increased echogenicity of right renal parenchyma noted,  consistent with medical renal disease. IMPRESSION: No evidence of gallstones or other hepatobiliary abnormality. Increased parenchymal echogenicity of right kidney, consistent with medical renal disease. Electronically Signed   By: Marlaine Hind M.D.   On: 06/10/2019 21:45    EKG: Ordered and pending.  Assessment/Plan Principal Problem:   Acute renal failure (ARF) (HCC) Active Problems:   Pancreatitis, alcoholic, acute   Alcohol use   Hypocalcemia  Martin Diaz is a 58 y.o. male with medical history significant for alcohol use and intellectual disability who is admitted with acute renal failure.   Acute renal failure: Unclear etiology.  Possibly prerenal from reported GI losses.  He denies any medication use.  Last available labs showed normal renal function 01/09/2018.  Patient reportedly still making small amount of urine. -Start IV NS 162m/hr overnight -Check urinalysis, urine sodium, urine creatinine -Check renal ultrasound -Nephrology notified by EDP, will see in a.m.  Pancreatitis with elevated AST/ALT: Lipase 339.  RUQ ultrasound negative for evidence of gallstones or hepatobiliary abnormality.  Suspect alcohol associated pancreatitis. -Continue IV fluids as above -Keep n.p.o. -Antiemetics as needed  Hypocalcemia: S/p 1 g calcium gluconate in the ED. -Repeat labs in a.m.  Alcohol use: Reported history significant alcohol use with  decreased intake recently.  He is high risk for alcohol withdrawal therefore will place in stepdown unit with CIWA protocol.   DVT prophylaxis: Subcutaneous heparin Code Status: Full code Family Communication: Discussed with uncle at bedside Disposition Plan: Pending clinical progress Consults called: Nephrology to see in a.m. Admission status: Admit - It is my clinical opinion that admission to INPATIENT is reasonable and necessary because of the expectation that this patient will require hospital care that crosses at least 2 midnights to  treat this condition based on the medical complexity of the problems presented.  Given the aforementioned information, the predictability of an adverse outcome is felt to be significant.      Zada Finders MD [facility]  If 7PM-7AM, please contact night-coverage [email or web address]  06/10/2019, 11:19 PM

## 2019-06-10 NOTE — ED Provider Notes (Signed)
[facility] Emergency Department Provider Note  ____________________________________________   First MD Initiated Contact with Patient 06/10/19 2037     (approximate)  I have reviewed the triage vital signs and the nursing notes.  History  Chief Complaint Diarrhea, Shaking, and Flank Pain    HPI Martin Diaz is a 58 y.o. male with a history of alcohol use, cognitive impairment (uncle states he functions at level of ~63 year old) who presents to the emergency department for abdominal pain, generalized decline, generalized weakness, shakiness, vomiting, diarrhea.  Patient presents with his uncle, who brought the patient to the emergency department due to generalized weakness.  The patient apparently lives in a boarding house.  His aunt was unable to get in touch with him like normal, and as a result his uncle went and checked on him and found him in his current state.  The patient reports some generalized abdominal pain, as well as multiple episodes of diarrhea today.  He reports one episode of vomiting yesterday.  He denies daily drinking, but the uncle states he did used to drink heavily.  The patient states he drinks 2 or 3 beers every other day.  He is not on any daily medications.  Uncle states he normally functions at the level of about a 56 year old.    Past Medical Hx History reviewed. No pertinent past medical history.  Problem List There are no active problems to display for this patient.   Past Surgical Hx History reviewed. No pertinent surgical history.  Medications Prior to Admission medications   Not on File    Allergies Patient has no known allergies.  Family Hx History reviewed. No pertinent family history.  Social Hx Social History   Tobacco Use  . Smoking status: Never Smoker  . Smokeless tobacco: Never Used  Substance Use Topics  . Alcohol use: Yes    Comment: says he hasn't been drinking  . Drug use: Not Currently      Review of Systems  Constitutional: Negative for fever, chills. Eyes: Negative for visual changes. ENT: Negative for sore throat. Cardiovascular: Negative for chest pain. Respiratory: Negative for shortness of breath. Gastrointestinal: + nausea, vomiting, diarrhea Genitourinary: Negative for dysuria. Musculoskeletal: Negative for leg swelling. Skin: Negative for rash. Neurological: Negative for for headaches.   Physical Exam  Vital Signs: ED Triage Vitals  Enc Vitals Group     BP 06/10/19 1858 (!) 164/103     Pulse Rate 06/10/19 1858 (!) 102     Resp 06/10/19 1858 20     Temp 06/10/19 1858 98 F (36.7 C)     Temp Source 06/10/19 1858 Axillary     SpO2 06/10/19 1858 99 %     Weight 06/10/19 1900 250 lb (113.4 kg)     Height --      Head Circumference --      Peak Flow --      Pain Score --      Pain Loc --      Pain Edu? --      Excl. in [place]? --     Constitutional: Awake.  Somewhat slow to respond, which is normal per uncle. Oriented to self and place. Head: Normocephalic. Atraumatic. Eyes: Conjunctivae clear. Sclera mildly icteric. Nose: No congestion. No rhinorrhea. Mouth/Throat: Mucous membranes are dry.  Neck: No stridor.   Cardiovascular: Tachycardic, regular rhythm. Extremities well perfused. Respiratory: Normal respiratory effort.  Lungs CTAB. Gastrointestinal: Soft.  Upper abdominal tenderness, without rebound or  guarding.  Somewhat distended. Musculoskeletal: No lower extremity edema. No deformities. Neurologic: Asterixis. Skin: Skin is warm, dry and intact. No rash noted. Psychiatric: Mood and affect are appropriate for situation.  EKG  N/A    Radiology  US abdomen: IMPRESSION:  No evidence of gallstones or other hepatobiliary abnormality.   Increased parenchymal echogenicity of right kidney, consistent with  medical renal disease.    Procedures  Procedure(s) performed (including critical care):  .Critical Care Performed by: Lilia Pro., MD Authorized by: Lilia Pro., MD   Critical care provider statement:    Critical care time (minutes):  45   Critical care was necessary to treat or prevent imminent or life-threatening deterioration of the following conditions:  Hepatic failure   Critical care was time spent personally by me on the following activities:  Discussions with consultants, evaluation of patient's response to treatment, examination of patient, ordering and performing treatments and interventions, ordering and review of laboratory studies, ordering and review of radiographic studies, pulse oximetry, re-evaluation of patient's condition, obtaining history from patient or surrogate and review of old charts     Initial Impression / Assessment and Plan / ED Course  58 y.o. male who presents to the ED for shakiness, generalized weakness, abdominal distention/pain, vomiting, diarrhea.  On exam he is able to answer questions, however he is slow to respond, this is normal per his uncle.  His sclera are mildly icteric and he has asterixis.  Abdomen is distended.  Labs initiated in triage are markedly abnormal.  Creatinine is 11.56 from normal previously.  BUN 188.  Hypocalcemic at 6.4.  Elevated AST and ALT, normal bilirubin.  Lipase 339.  Concern for potential alcoholic pancreatitis (w/ resultant hypocalcemia) as well as new diagnosis liver failure (w/ potential hepatorenal syndrome vs severe kidney injury 2/2 pancreatitis, or combination of both).  We will give fluids, replete calcium, and added on INR, ammonia, ethanol level. US abdomen. Will admit. Family comfortable with plan. Discussed with hospitalist.   Final Clinical Impression(s) / ED Diagnosis  Final diagnoses:  Abdominal pain  Alcohol-induced acute pancreatitis, unspecified complication status  Acute liver failure without hepatic coma       Note:  This document was prepared using Dragon voice recognition software and may include  unintentional dictation errors.   Lilia Pro., MD 06/11/19 0001

## 2019-06-10 NOTE — ED Notes (Signed)
Notified Dr Joan Mayans of pt's elevated Calcium of 6.4; acknowledged

## 2019-06-10 NOTE — ED Triage Notes (Signed)
Pt arrives with family member (uncle), who went to check on him because his aunt couldn't get in touch with him. Pt has strong urine smell and is shaking during triage. Family member states this is not normal for him. Pt is slow to answer questions and uncle says he is "slow" and this is normal for him. Pt lives in a boarding house and his aunt is his legal guardian.

## 2019-06-11 ENCOUNTER — Encounter: Payer: Self-pay | Admitting: Internal Medicine

## 2019-06-11 DIAGNOSIS — Z7289 Other problems related to lifestyle: Secondary | ICD-10-CM

## 2019-06-11 DIAGNOSIS — K852 Alcohol induced acute pancreatitis without necrosis or infection: Secondary | ICD-10-CM

## 2019-06-11 LAB — CBC
HCT: 40.9 % (ref 39.0–52.0)
Hemoglobin: 14.2 g/dL (ref 13.0–17.0)
MCH: 28.7 pg (ref 26.0–34.0)
MCHC: 34.7 g/dL (ref 30.0–36.0)
MCV: 82.6 fL (ref 80.0–100.0)
Platelets: 186 10*3/uL (ref 150–400)
RBC: 4.95 MIL/uL (ref 4.22–5.81)
RDW: 12.2 % (ref 11.5–15.5)
WBC: 8.4 10*3/uL (ref 4.0–10.5)
nRBC: 0 % (ref 0.0–0.2)

## 2019-06-11 LAB — MRSA PCR SCREENING: MRSA by PCR: NEGATIVE

## 2019-06-11 LAB — URINALYSIS, COMPLETE (UACMP) WITH MICROSCOPIC
Bilirubin Urine: NEGATIVE
Glucose, UA: NEGATIVE mg/dL
Ketones, ur: NEGATIVE mg/dL
Nitrite: NEGATIVE
Protein, ur: 100 mg/dL — AB
Specific Gravity, Urine: 1.012 (ref 1.005–1.030)
pH: 5 (ref 5.0–8.0)

## 2019-06-11 LAB — HEPATIC FUNCTION PANEL
ALT: 208 U/L — ABNORMAL HIGH (ref 0–44)
AST: 327 U/L — ABNORMAL HIGH (ref 15–41)
Albumin: 3.3 g/dL — ABNORMAL LOW (ref 3.5–5.0)
Alkaline Phosphatase: 71 U/L (ref 38–126)
Bilirubin, Direct: 0.3 mg/dL — ABNORMAL HIGH (ref 0.0–0.2)
Indirect Bilirubin: 1.1 mg/dL — ABNORMAL HIGH (ref 0.3–0.9)
Total Bilirubin: 1.4 mg/dL — ABNORMAL HIGH (ref 0.3–1.2)
Total Protein: 7.3 g/dL (ref 6.5–8.1)

## 2019-06-11 LAB — PROTEIN / CREATININE RATIO, URINE
Creatinine, Urine: 136 mg/dL
Protein Creatinine Ratio: 0.6 mg/mg{Cre} — ABNORMAL HIGH (ref 0.00–0.15)
Total Protein, Urine: 81 mg/dL

## 2019-06-11 LAB — CREATININE, URINE, RANDOM: Creatinine, Urine: 151 mg/dL

## 2019-06-11 LAB — HIV ANTIBODY (ROUTINE TESTING W REFLEX): HIV Screen 4th Generation wRfx: NONREACTIVE

## 2019-06-11 LAB — CK: Total CK: 23960 U/L — ABNORMAL HIGH (ref 49–397)

## 2019-06-11 LAB — SODIUM, URINE, RANDOM: Sodium, Ur: 24 mmol/L

## 2019-06-11 LAB — SARS CORONAVIRUS 2 BY RT PCR (HOSPITAL ORDER, PERFORMED IN ~~LOC~~ HOSPITAL LAB): SARS Coronavirus 2: NEGATIVE

## 2019-06-11 LAB — GLUCOSE, CAPILLARY: Glucose-Capillary: 81 mg/dL (ref 70–99)

## 2019-06-11 MED ORDER — AMLODIPINE BESYLATE 5 MG PO TABS
5.0000 mg | ORAL_TABLET | Freq: Every day | ORAL | Status: DC
  Administered 2019-06-11: 5 mg via ORAL
  Filled 2019-06-11: qty 1

## 2019-06-11 MED ORDER — LABETALOL HCL 5 MG/ML IV SOLN
10.0000 mg | INTRAVENOUS | Status: DC | PRN
  Administered 2019-06-11 – 2019-06-21 (×5): 10 mg via INTRAVENOUS
  Filled 2019-06-11 (×5): qty 4

## 2019-06-11 MED ORDER — HYDRALAZINE HCL 20 MG/ML IJ SOLN
10.0000 mg | Freq: Four times a day (QID) | INTRAMUSCULAR | Status: DC | PRN
  Administered 2019-06-11 (×2): 10 mg via INTRAVENOUS
  Filled 2019-06-11 (×2): qty 1

## 2019-06-11 MED ORDER — HYDRALAZINE HCL 20 MG/ML IJ SOLN
10.0000 mg | INTRAMUSCULAR | Status: DC | PRN
  Administered 2019-06-12 – 2019-06-21 (×10): 10 mg via INTRAVENOUS
  Filled 2019-06-11 (×10): qty 1

## 2019-06-11 MED ORDER — CALCIUM GLUCONATE-NACL 2-0.675 GM/100ML-% IV SOLN
2.0000 g | Freq: Once | INTRAVENOUS | Status: AC
  Administered 2019-06-11: 2000 mg via INTRAVENOUS
  Filled 2019-06-11: qty 100

## 2019-06-11 MED ORDER — SODIUM BICARBONATE-DEXTROSE 150-5 MEQ/L-% IV SOLN
150.0000 meq | INTRAVENOUS | Status: DC
  Administered 2019-06-11 – 2019-06-14 (×7): 150 meq via INTRAVENOUS
  Filled 2019-06-11 (×7): qty 1000

## 2019-06-11 MED ORDER — CHLORHEXIDINE GLUCONATE CLOTH 2 % EX PADS
6.0000 | MEDICATED_PAD | Freq: Every day | CUTANEOUS | Status: DC
  Administered 2019-06-11 – 2019-06-19 (×8): 6 via TOPICAL

## 2019-06-11 NOTE — ED Notes (Signed)
Patient resting quietly at this time.

## 2019-06-11 NOTE — ED Notes (Signed)
Patient resting quietly, remains awake.  Sitter at bedside due to patient pulling out IV.

## 2019-06-11 NOTE — ED Notes (Signed)
In to check on patient.  Patient has again pulled out his IV.

## 2019-06-11 NOTE — Progress Notes (Signed)
Progress Note    Martin Diaz  [id number] DOB: [birth year]  DOA: 06/10/2019 PCP: Patient, No Pcp Per     Assessment/Plan:   Principal Problem:   Acute renal failure (ARF) ([place]) Active Problems:   Pancreatitis, alcoholic, acute   Alcohol use   Hypocalcemia   Body mass index is 24.77 kg/m.    Severe acute kidney injury with metabolic acidosis: Continue IV fluids (a sodium bicarbonate infusion).  Renal ultrasound did not show any evidence of obstruction.  Monitor BMP closely.  Follow-up with nephrologist for further recommendations.  Elevated lipase: Probably from severe acute acute kidney injury versus alcohol associated pancreatitis.  Currently, patient has no pain or any GI symptoms.  Trial of low-salt diet  Hypocalcemia: Repeated another dose of IV calcium gluconate today.  Monitor calcium levels.  Hypertensive urgency: Start amlodipine.  IV hydralazine as needed for severe hypertension.  Alcohol use disorder: Continue thiamine, folic acid and Ativan as needed for withdrawal.    Family Communication/Anticipated D/C date and plan/Code Status   DVT prophylaxis: Heparin Code Status: Full code Family Communication: None Disposition Plan: Possible discharge to home in 4 to 5 days      Subjective:   It is difficult to obtain any history from the patient.  His speech is soft and barely audible   Objective:    Vitals:   06/11/19 1330 06/11/19 1400 06/11/19 1430 06/11/19 1500  BP: (!) 155/72 (!) 180/74 (!) 195/93   Pulse: 83  82   Resp: 17  (!) 26 (!) 27  Temp:      TempSrc:      SpO2: 99%  99%   Weight:      Height:        Intake/Output Summary (Last 24 hours) at 06/11/2019 1546 Last data filed at 06/11/2019 1519 Gross per 24 hour  Intake 1961.12 ml  Output -  Net 1961.12 ml   Filed Weights   06/10/19 1900 06/11/19 1208  Weight: 113.4 kg 92.3 kg    Exam:  GEN: NAD SKIN: No rash EYES: EOMI ENT: MMM CV: RRR PULM: CTA B ABD: soft, ND,  NT, +BS CNS: AAO x 3, non focal EXT: No edema or tenderness  Data Reviewed:   I have personally reviewed following labs and imaging studies:  Labs: Labs show the following:   Basic Metabolic Panel: Recent Labs  Lab 06/10/19 1907 06/11/19 0421  NA 130* 133*  K 4.4 5.0  CL 90* 93*  CO2 17* 15*  GLUCOSE 130* 121*  BUN 188* 172*  CREATININE 11.56* 11.84*  CALCIUM 6.4* 6.0*  PHOS  --  11.3*   GFR Estimated Creatinine Clearance: 8.6 mL/min (A) (by C-G formula based on SCr of 11.84 mg/dL (H)). Liver Function Tests: Recent Labs  Lab 06/10/19 1907 06/11/19 0421  AST 450* 327*  ALT 237* 208*  ALKPHOS 77 71  BILITOT 1.0 1.4*  PROT 7.9 7.3  ALBUMIN 3.5 3.3*  3.4*   Recent Labs  Lab 06/10/19 1907  LIPASE 339*   Recent Labs  Lab 06/10/19 2058  AMMONIA 22   Coagulation profile Recent Labs  Lab 06/10/19 2058  INR 1.3*    CBC: Recent Labs  Lab 06/10/19 1907 06/11/19 0421  WBC 9.3 8.4  HGB 15.2 14.2  HCT 44.6 40.9  MCV 83.4 82.6  PLT 201 186   Cardiac Enzymes: No results for input(s): CKTOTAL, CKMB, CKMBINDEX, TROPONINI in the last 168 hours. BNP (last 3 results) No results for input(s):  PROBNP in the last 8760 hours. CBG: Recent Labs  Lab 06/11/19 1200  GLUCAP 81   D-Dimer: No results for input(s): DDIMER in the last 72 hours. Hgb A1c: No results for input(s): HGBA1C in the last 72 hours. Lipid Profile: No results for input(s): CHOL, HDL, LDLCALC, TRIG, CHOLHDL, LDLDIRECT in the last 72 hours. Thyroid function studies: No results for input(s): TSH, T4TOTAL, T3FREE, THYROIDAB in the last 72 hours.  Invalid input(s): FREET3 Anemia work up: No results for input(s): VITAMINB12, FOLATE, FERRITIN, TIBC, IRON, RETICCTPCT in the last 72 hours. Sepsis Labs: Recent Labs  Lab 06/10/19 1907 06/11/19 0421  WBC 9.3 8.4    Microbiology Recent Results (from the past 240 hour(s))  SARS Coronavirus 2 by RT PCR (hospital order, performed in [place]  hospital lab)     Status: None   Collection Time: 06/10/19 10:02 PM  Result Value Ref Range Status   SARS Coronavirus 2 NEGATIVE NEGATIVE Final    Comment: (NOTE) If result is NEGATIVE SARS-CoV-2 target nucleic acids are NOT DETECTED. The SARS-CoV-2 RNA is generally detectable in upper and lower  respiratory specimens during the acute phase of infection. The lowest  concentration of SARS-CoV-2 viral copies this assay can detect is 250  copies / mL. A negative result does not preclude SARS-CoV-2 infection  and should not be used as the sole basis for treatment or other  patient management decisions.  A negative result may occur with  improper specimen collection / handling, submission of specimen other  than nasopharyngeal swab, presence of viral mutation(s) within the  areas targeted by this assay, and inadequate number of viral copies  (<250 copies / mL). A negative result must be combined with clinical  observations, patient history, and epidemiological information. If result is POSITIVE SARS-CoV-2 target nucleic acids are DETECTED. The SARS-CoV-2 RNA is generally detectable in upper and lower  respiratory specimens dur ing the acute phase of infection.  Positive  results are indicative of active infection with SARS-CoV-2.  Clinical  correlation with patient history and other diagnostic information is  necessary to determine patient infection status.  Positive results do  not rule out bacterial infection or co-infection with other viruses. If result is PRESUMPTIVE POSTIVE SARS-CoV-2 nucleic acids MAY BE PRESENT.   A presumptive positive result was obtained on the submitted specimen  and confirmed on repeat testing.  While 2019 novel coronavirus  (SARS-CoV-2) nucleic acids may be present in the submitted sample  additional confirmatory testing may be necessary for epidemiological  and / or clinical management purposes  to differentiate between  SARS-CoV-2 and other Sarbecovirus  currently known to infect humans.  If clinically indicated additional testing with an alternate test  methodology [phone]) is advised. The SARS-CoV-2 RNA is generally  detectable in upper and lower respiratory sp ecimens during the acute  phase of infection. The expected result is Negative. Fact Sheet for Patients:  StrictlyIdeas.no Fact Sheet for Healthcare Providers: BankingDealers.co.za This test is not yet approved or cleared by the [place] FDA and has been authorized for detection and/or diagnosis of SARS-CoV-2 by FDA under an Emergency Use Authorization (EUA).  This EUA will remain in effect (meaning this test can be used) for the duration of the COVID-19 declaration under Section 564(b)(1) of the Act, 21 U.S.C. section 360bbb-3(b)(1), unless the authorization is terminated or revoked sooner. Performed at [facility], [place] [address], [place], [place] [address]   MRSA PCR Screening     Status: None  Collection Time: 06/11/19 12:30 PM   Specimen: Nasopharyngeal  Result Value Ref Range Status   MRSA by PCR NEGATIVE NEGATIVE Final    Comment:        The GeneXpert MRSA Assay (FDA approved for NASAL specimens only), is one component of a comprehensive MRSA colonization surveillance program. It is not intended to diagnose MRSA infection nor to guide or monitor treatment for MRSA infections. Performed at [facility], [address] [place], [place] [address]     Procedures and diagnostic studies:  US Renal  Result Date: 2019/07/02 CLINICAL DATA:  Renal failure EXAM: RENAL / URINARY TRACT ULTRASOUND COMPLETE COMPARISON:  None at FINDINGS: Right Kidney: Renal measurements: 13.2 x 7.4 x 6.4 cm = volume: 325 mL. Normal cortical thickness. Increased cortical echogenicity. No mass, hydronephrosis, or shadowing calcification. Left Kidney: Renal measurements: 14.4 x 5.9 x 5.6 cm = volume: 248 mL. Normal cortical  thickness. Increased cortical echogenicity. Exophytic nodule from lateral upper to mid LEFT kidney 2.2 x 1.7 x 1.8 cm, containing low level internal echoes, question solid versus complex cystic. No additional mass, hydronephrosis or shadowing calcification. Bladder: Appears normal for degree of bladder distention. Other: N/A IMPRESSION: Medical renal disease changes of both kidneys. Exophytic indeterminate nodule lateral aspect of upper to mid LEFT kidney 2.2 cm greatest size; this could represent a solid or a complex cystic lesion and further characterization by MR imaging is recommended. Electronically Signed   By: Lavonia Dana M.D.   On: July 02, 2019 23:43   US Abdomen Limited Ruq  Result Date: July 02, 2019 CLINICAL DATA:  Abdominal pain.  Elevated lipase. EXAM: ULTRASOUND ABDOMEN LIMITED RIGHT UPPER QUADRANT COMPARISON:  None. FINDINGS: Gallbladder: No gallstones or wall thickening visualized. No sonographic Murphy sign noted by sonographer. Common bile duct: Diameter: 5 mm, within normal limits. Liver: No focal lesion identified. Within normal limits in parenchymal echogenicity. Portal vein is patent on color Doppler imaging with normal direction of blood flow towards the liver. Other: Increased echogenicity of right renal parenchyma noted, consistent with medical renal disease. IMPRESSION: No evidence of gallstones or other hepatobiliary abnormality. Increased parenchymal echogenicity of right kidney, consistent with medical renal disease. Electronically Signed   By: Marlaine Hind M.D.   On: 02-Jul-2019 21:45    Medications:   . Chlorhexidine Gluconate Cloth  6 each Topical Daily  . folic acid  1 mg Oral Daily  . heparin  5,000 Units Subcutaneous Q8H  . multivitamin with minerals  1 tablet Oral Daily  . sodium chloride flush  3 mL Intravenous Once  . sodium chloride flush  3 mL Intravenous Q12H  . thiamine  100 mg Oral Daily   Or  . thiamine  100 mg Intravenous Daily   Continuous Infusions: .  sodium bicarbonate 150 mEq in dextrose 5% 1000 mL       LOS: 1 day   Kashae Carstens  [facility] Pager [phone].   *Please refer to [email or web address], password [email or web address] to get updated schedule on who will round on this patient, as hospitalists switch teams weekly. If 7PM-7AM, please contact night-coverage at [email or web address], password [email or web address] for any overnight needs.  06/11/2019, 3:46 PM

## 2019-06-11 NOTE — Consult Note (Signed)
[facility] CONSULT NOTE    Date: 06/11/2019                  Patient Name:  Martin Diaz  MRN: [id number]  DOB: [birth year]  Age / Sex: 58 y.o., male         PCP: Patient, No Pcp Per                 Service Requesting Consult: Hospitalist                 Reason for Consult: Acute renal failure, metabolic acidosis            History of Present Illness: Patient is a 58 y.o. male with a PMHx of alcohol abuse and impaired cognitive function, who was admitted to [facility] on 06/10/2019 for evaluation of weakness, diarrhea, nausea, and vomiting.  He was quite agitated upon our initial evaluation and therefore cannot offer significant history at this time.  Apparently at home he was having nausea, vomiting, diarrhea, and weakness.  He has history of alcohol abuse though his serum ethanol level was undetectable upon admission.  He states that he often does not eat on a daily basis.  We are asked to see him for evaluation management of acute renal failure.  His baseline creatinine was 0.7 from January 09, 2018.  When he presented his creatinine was 11.56.  He also appears to have metabolic acidosis with a serum bicarbonate of 15 currently.  Renal ultrasound has been performed and was negative for hydronephrosis.  However increased bilateral echogenicity was noted.   Medications: Outpatient medications: No medications prior to admission.    Current medications: Current Facility-Administered Medications  Medication Dose Route Frequency Provider Last Rate Last Dose  . 0.9 %  sodium chloride infusion   Intravenous Continuous Lenore Cordia, MD 150 mL/hr at 06/11/19 1229    . Chlorhexidine Gluconate Cloth 2 % PADS 6 each  6 each Topical Daily Jennye Boroughs, MD   6 each at 06/11/19 1228  . folic acid (FOLVITE) tablet 1 mg  1 mg Oral Daily Zada Finders R, MD   1 mg at 06/11/19 0916  . heparin injection 5,000 Units  5,000 Units Subcutaneous Q8H Zada Finders R, MD   5,000 Units at  06/11/19 0134  . hydrALAZINE (APRESOLINE) injection 10 mg  10 mg Intravenous Q6H PRN Jennye Boroughs, MD   10 mg at 06/11/19 0952  . LORazepam (ATIVAN) tablet 1-4 mg  1-4 mg Oral Q1H PRN Lenore Cordia, MD       Or  . LORazepam (ATIVAN) injection 1-4 mg  1-4 mg Intravenous Q1H PRN Lenore Cordia, MD      . multivitamin with minerals tablet 1 tablet  1 tablet Oral Daily Lenore Cordia, MD   1 tablet at 06/11/19 0916  . ondansetron (ZOFRAN) tablet 4 mg  4 mg Oral Q6H PRN Lenore Cordia, MD       Or  . ondansetron (ZOFRAN) injection 4 mg  4 mg Intravenous Q6H PRN Zada Finders R, MD      . sodium chloride flush (NS) 0.9 % injection 3 mL  3 mL Intravenous Once Lilia Pro., MD      . sodium chloride flush (NS) 0.9 % injection 3 mL  3 mL Intravenous Q12H Patel, Vishal R, MD      . thiamine (VITAMIN B-1) tablet 100 mg  100 mg Oral Daily Lenore Cordia, MD  100 mg at 06/11/19 [id number]   Or  . thiamine (B-1) injection 100 mg  100 mg Intravenous Daily Lenore Cordia, MD          Allergies: No Known Allergies    Past Medical History: Past Medical History:  Diagnosis Date  . Alcohol use      Past Surgical History: History reviewed. No pertinent surgical history.   Family History: History reviewed. No pertinent family history.   Social History: Social History   Socioeconomic History  . Marital status: Single    Spouse name: Not on file  . Number of children: Not on file  . Years of education: Not on file  . Highest education level: Not on file  Occupational History  . Not on file  Social Needs  . Financial resource strain: Not on file  . Food insecurity    Worry: Not on file    Inability: Not on file  . Transportation needs    Medical: Not on file    Non-medical: Not on file  Tobacco Use  . Smoking status: Never Smoker  . Smokeless tobacco: Never Used  Substance and Sexual Activity  . Alcohol use: Yes    Comment: says he hasn't been drinking  . Drug use: Not  Currently  . Sexual activity: Not on file  Lifestyle  . Physical activity    Days per week: Not on file    Minutes per session: Not on file  . Stress: Not on file  Relationships  . Social Herbalist on phone: Not on file    Gets together: Not on file    Attends religious service: Not on file    Active member of club or organization: Not on file    Attends meetings of clubs or organizations: Not on file    Relationship status: Not on file  . Intimate partner violence    Fear of current or ex partner: Not on file    Emotionally abused: Not on file    Physically abused: Not on file    Forced sexual activity: Not on file  Other Topics Concern  . Not on file  Social History Narrative  . Not on file     Review of Systems: Patient unable to offer secondary to inability concentrate on questions posed.  Vital Signs: Blood pressure (!) 155/66, pulse 77, temperature 97.7 F (36.5 C), temperature source Oral, resp. rate 16, height 6\' 4"  (1.93 m), weight 92.3 kg, SpO2 100 %.  Weight trends: Filed Weights   06/10/19 1900 06/11/19 1208  Weight: 113.4 kg 92.3 kg    Physical Exam: General: NAD, resting in gurney  Head: Normocephalic, atraumatic.  Eyes: Anicteric, EOMI  Nose: Mucous membranes dry, not inflammed, nonerythematous.  Throat: Oropharynx nonerythematous, oral mucosa dry  Neck: Supple, trachea midline.  Lungs:  Normal respiratory effort. Clear to auscultation BL without crackles or wheezes.  Heart: RRR. S1 and S2 normal without gallop, murmur, or rubs.  Abdomen:  BS normoactive. Soft, Nondistended, non-tender.  No masses or organomegaly.  Extremities: No pretibial edema.  Neurologic: Awake, alert, difficulty concentrating on questions posed  Skin: No visible rashes, scars.    Lab results: Basic Metabolic Panel: Recent Labs  Lab 06/10/19 1907 06/11/19 0421  NA 130* 133*  K 4.4 5.0  CL 90* 93*  CO2 17* 15*  GLUCOSE 130* 121*  BUN 188* 172*   CREATININE 11.56* 11.84*  CALCIUM 6.4* 6.0*  PHOS  --  11.3*  Liver Function Tests: Recent Labs  Lab 06/10/19 1907 06/11/19 0421  AST 450* 327*  ALT 237* 208*  ALKPHOS 77 71  BILITOT 1.0 1.4*  PROT 7.9 7.3  ALBUMIN 3.5 3.3*  3.4*   Recent Labs  Lab 06/10/19 1907  LIPASE 339*   Recent Labs  Lab 06/10/19 2058  AMMONIA 22    CBC: Recent Labs  Lab 06/10/19 1907 06/11/19 0421  WBC 9.3 8.4  HGB 15.2 14.2  HCT 44.6 40.9  MCV 83.4 82.6  PLT 201 186    Cardiac Enzymes: No results for input(s): CKTOTAL, CKMB, CKMBINDEX, TROPONINI in the last 168 hours.  BNP: Invalid input(s): POCBNP  CBG: Recent Labs  Lab 06/11/19 1200  GLUCAP 81    Microbiology: Results for orders placed or performed during the hospital encounter of 06/10/19  SARS Coronavirus 2 by RT PCR (hospital order, performed in [place] hospital lab)     Status: None   Collection Time: 06/10/19 10:02 PM  Result Value Ref Range Status   SARS Coronavirus 2 NEGATIVE NEGATIVE Final    Comment: (NOTE) If result is NEGATIVE SARS-CoV-2 target nucleic acids are NOT DETECTED. The SARS-CoV-2 RNA is generally detectable in upper and lower  respiratory specimens during the acute phase of infection. The lowest  concentration of SARS-CoV-2 viral copies this assay can detect is 250  copies / mL. A negative result does not preclude SARS-CoV-2 infection  and should not be used as the sole basis for treatment or other  patient management decisions.  A negative result may occur with  improper specimen collection / handling, submission of specimen other  than nasopharyngeal swab, presence of viral mutation(s) within the  areas targeted by this assay, and inadequate number of viral copies  (<250 copies / mL). A negative result must be combined with clinical  observations, patient history, and epidemiological information. If result is POSITIVE SARS-CoV-2 target nucleic acids are DETECTED. The SARS-CoV-2 RNA  is generally detectable in upper and lower  respiratory specimens dur ing the acute phase of infection.  Positive  results are indicative of active infection with SARS-CoV-2.  Clinical  correlation with patient history and other diagnostic information is  necessary to determine patient infection status.  Positive results do  not rule out bacterial infection or co-infection with other viruses. If result is PRESUMPTIVE POSTIVE SARS-CoV-2 nucleic acids MAY BE PRESENT.   A presumptive positive result was obtained on the submitted specimen  and confirmed on repeat testing.  While 2019 novel coronavirus  (SARS-CoV-2) nucleic acids may be present in the submitted sample  additional confirmatory testing may be necessary for epidemiological  and / or clinical management purposes  to differentiate between  SARS-CoV-2 and other Sarbecovirus currently known to infect humans.  If clinically indicated additional testing with an alternate test  methodology [phone]) is advised. The SARS-CoV-2 RNA is generally  detectable in upper and lower respiratory sp ecimens during the acute  phase of infection. The expected result is Negative. Fact Sheet for Patients:  StrictlyIdeas.no Fact Sheet for Healthcare Providers: BankingDealers.co.za This test is not yet approved or cleared by the [place] FDA and has been authorized for detection and/or diagnosis of SARS-CoV-2 by FDA under an Emergency Use Authorization (EUA).  This EUA will remain in effect (meaning this test can be used) for the duration of the COVID-19 declaration under Section 564(b)(1) of the Act, 21 U.S.C. section 360bbb-3(b)(1), unless the authorization is terminated or revoked sooner. Performed at [facility],  [address] [place], [place] [address]   MRSA PCR Screening     Status: None   Collection Time: 06/11/19 12:30 PM   Specimen: Nasopharyngeal  Result Value Ref Range  Status   MRSA by PCR NEGATIVE NEGATIVE Final    Comment:        The GeneXpert MRSA Assay (FDA approved for NASAL specimens only), is one component of a comprehensive MRSA colonization surveillance program. It is not intended to diagnose MRSA infection nor to guide or monitor treatment for MRSA infections. Performed at [facility], [address] [place], [place] [address]     Coagulation Studies: Recent Labs    06/10/19 November 21, 2056  LABPROT 15.8*  INR 1.3*    Urinalysis: Recent Labs    06/11/19 0316  COLORURINE YELLOW*  LABSPEC 1.012  PHURINE 5.0  GLUCOSEU NEGATIVE  HGBUR LARGE*  BILIRUBINUR NEGATIVE  KETONESUR NEGATIVE  PROTEINUR 100*  NITRITE NEGATIVE  LEUKOCYTESUR SMALL*      Imaging: US Renal  Result Date: 06/10/2019 CLINICAL DATA:  Renal failure EXAM: RENAL / URINARY TRACT ULTRASOUND COMPLETE COMPARISON:  None at FINDINGS: Right Kidney: Renal measurements: 13.2 x 7.4 x 6.4 cm = volume: 325 mL. Normal cortical thickness. Increased cortical echogenicity. No mass, hydronephrosis, or shadowing calcification. Left Kidney: Renal measurements: 14.4 x 5.9 x 5.6 cm = volume: 248 mL. Normal cortical thickness. Increased cortical echogenicity. Exophytic nodule from lateral upper to mid LEFT kidney 2.2 x 1.7 x 1.8 cm, containing low level internal echoes, question solid versus complex cystic. No additional mass, hydronephrosis or shadowing calcification. Bladder: Appears normal for degree of bladder distention. Other: N/A IMPRESSION: Medical renal disease changes of both kidneys. Exophytic indeterminate nodule lateral aspect of upper to mid LEFT kidney 2.2 cm greatest size; this could represent a solid or a complex cystic lesion and further characterization by MR imaging is recommended. Electronically Signed   By: Lavonia Dana M.D.   On: 06/10/2019 23:43   US Abdomen Limited Ruq  Result Date: 06/10/2019 CLINICAL DATA:  Abdominal pain.  Elevated lipase. EXAM: ULTRASOUND  ABDOMEN LIMITED RIGHT UPPER QUADRANT COMPARISON:  None. FINDINGS: Gallbladder: No gallstones or wall thickening visualized. No sonographic Murphy sign noted by sonographer. Common bile duct: Diameter: 5 mm, within normal limits. Liver: No focal lesion identified. Within normal limits in parenchymal echogenicity. Portal vein is patent on color Doppler imaging with normal direction of blood flow towards the liver. Other: Increased echogenicity of right renal parenchyma noted, consistent with medical renal disease. IMPRESSION: No evidence of gallstones or other hepatobiliary abnormality. Increased parenchymal echogenicity of right kidney, consistent with medical renal disease. Electronically Signed   By: Marlaine Hind M.D.   On: 06/10/2019 21:45      Assessment & Plan: Pt is a 58 y.o. male with a PMHx of alcohol abuse and impaired cognitive function, who was admitted to [facility] on 06/10/2019 for evaluation of weakness, diarrhea, nausea, and vomiting.    1.  Acute kidney injury suspected secondary to prolonged volume depletion.  Patient reports that his p.o. intake has not been very good over the past several days.  He also had nausea, vomiting, and diarrhea upon admission.  We will change IV fluids to sodium bicarb drip 150 mEq to be administered at 125 cc/h.  No urgent indication for dialysis at the moment however this may need to be considered if renal function continues to worsen.  Renal ultrasound was performed and was negative for hydronephrosis.  We will proceed with additional  serologic work-up as well.  2.  Metabolic acidosis.  As above we will switch the patient over to bicarbonate drip.  3.  Thanks for consultation.

## 2019-06-11 NOTE — ED Notes (Signed)
Patient pulled IV out of right hand.

## 2019-06-11 NOTE — ED Notes (Signed)
Pt cleaned up, assisted with urinal.  Sheets and gown changed.

## 2019-06-11 NOTE — ED Notes (Signed)
Lab at [facility] cone called to see if can change COVID swab to rapid since pt is ICU pt. They will return call.

## 2019-06-11 NOTE — ED Notes (Signed)
Attempted to call report, unable to take at this time

## 2019-06-11 NOTE — ED Notes (Signed)
Tracey, EDT at bedside due to patient pulling out IV.

## 2019-06-11 NOTE — ED Notes (Signed)
attempted to call report, unable to take at this time.

## 2019-06-11 NOTE — ED Notes (Signed)
Messaged provider reference pt's Ca at 6.0.

## 2019-06-11 NOTE — ED Notes (Signed)
In to perform EKG and patient has pulled out his IV, bedding is soaked and large puddle in floor.  Patient cleansed, sheets changed.  Patient repositioned for comfort.

## 2019-06-11 NOTE — ED Notes (Signed)
Pt aunt updated on moving to ICU

## 2019-06-11 NOTE — ED Notes (Signed)
Patient was dried at this time with the help of Rosine Abe

## 2019-06-12 ENCOUNTER — Inpatient Hospital Stay: Payer: Medicare Other

## 2019-06-12 DIAGNOSIS — N170 Acute kidney failure with tubular necrosis: Secondary | ICD-10-CM

## 2019-06-12 DIAGNOSIS — M6282 Rhabdomyolysis: Secondary | ICD-10-CM

## 2019-06-12 LAB — CBC WITH DIFFERENTIAL/PLATELET
Abs Immature Granulocytes: 0.05 10*3/uL (ref 0.00–0.07)
Basophils Absolute: 0 10*3/uL (ref 0.0–0.1)
Basophils Relative: 0 %
Eosinophils Absolute: 0.1 10*3/uL (ref 0.0–0.5)
Eosinophils Relative: 1 %
HCT: 35.3 % — ABNORMAL LOW (ref 39.0–52.0)
Hemoglobin: 12.6 g/dL — ABNORMAL LOW (ref 13.0–17.0)
Immature Granulocytes: 1 %
Lymphocytes Relative: 13 %
Lymphs Abs: 0.7 10*3/uL (ref 0.7–4.0)
MCH: 28.8 pg (ref 26.0–34.0)
MCHC: 35.7 g/dL (ref 30.0–36.0)
MCV: 80.6 fL (ref 80.0–100.0)
Monocytes Absolute: 0.8 10*3/uL (ref 0.1–1.0)
Monocytes Relative: 14 %
Neutro Abs: 4 10*3/uL (ref 1.7–7.7)
Neutrophils Relative %: 71 %
Platelets: 163 10*3/uL (ref 150–400)
RBC: 4.38 MIL/uL (ref 4.22–5.81)
RDW: 11.8 % (ref 11.5–15.5)
WBC: 5.6 10*3/uL (ref 4.0–10.5)
nRBC: 0 % (ref 0.0–0.2)

## 2019-06-12 LAB — PROTEIN ELECTROPHORESIS, SERUM
A/G Ratio: 0.8 (ref 0.7–1.7)
Albumin ELP: 2.8 g/dL — ABNORMAL LOW (ref 2.9–4.4)
Alpha-1-Globulin: 0.4 g/dL (ref 0.0–0.4)
Alpha-2-Globulin: 0.8 g/dL (ref 0.4–1.0)
Beta Globulin: 1 g/dL (ref 0.7–1.3)
Gamma Globulin: 1.2 g/dL (ref 0.4–1.8)
Globulin, Total: 3.4 g/dL (ref 2.2–3.9)
Total Protein ELP: 6.2 g/dL (ref 6.0–8.5)

## 2019-06-12 LAB — COMPREHENSIVE METABOLIC PANEL
ALT: 141 U/L — ABNORMAL HIGH (ref 0–44)
AST: 156 U/L — ABNORMAL HIGH (ref 15–41)
Albumin: 2.9 g/dL — ABNORMAL LOW (ref 3.5–5.0)
Alkaline Phosphatase: 64 U/L (ref 38–126)
Anion gap: 23 — ABNORMAL HIGH (ref 5–15)
BUN: 193 mg/dL — ABNORMAL HIGH (ref 6–20)
CO2: 21 mmol/L — ABNORMAL LOW (ref 22–32)
Calcium: 5.3 mg/dL — CL (ref 8.9–10.3)
Chloride: 90 mmol/L — ABNORMAL LOW (ref 98–111)
Creatinine, Ser: 11.89 mg/dL — ABNORMAL HIGH (ref 0.61–1.24)
GFR calc Af Amer: 5 mL/min — ABNORMAL LOW (ref >60)
GFR calc non Af Amer: 4 mL/min — ABNORMAL LOW (ref >60)
Glucose, Bld: 156 mg/dL — ABNORMAL HIGH (ref 70–99)
Potassium: 3.9 mmol/L (ref 3.5–5.1)
Sodium: 134 mmol/L — ABNORMAL LOW (ref 135–145)
Total Bilirubin: 1.1 mg/dL (ref 0.3–1.2)
Total Protein: 6.4 g/dL — ABNORMAL LOW (ref 6.5–8.1)

## 2019-06-12 LAB — BLOOD GAS, ARTERIAL
Acid-Base Excess: 2 mmol/L (ref 0.0–2.0)
Bicarbonate: 26.5 mmol/L (ref 20.0–28.0)
FIO2: 0.3
MECHVT: 500 mL
O2 Saturation: 99.2 %
PEEP: 5 cmH2O
Patient temperature: 37
RATE: 16 resp/min
pCO2 arterial: 40 mmHg (ref 32.0–48.0)
pH, Arterial: 7.43 (ref 7.350–7.450)
pO2, Arterial: 138 mmHg — ABNORMAL HIGH (ref 83.0–108.0)

## 2019-06-12 LAB — RENAL FUNCTION PANEL
Albumin: 3.4 g/dL — ABNORMAL LOW (ref 3.5–5.0)
Albumin: 2.7 g/dL — ABNORMAL LOW (ref 3.5–5.0)
Anion gap: 25 — ABNORMAL HIGH (ref 5–15)
Anion gap: 18 — ABNORMAL HIGH (ref 5–15)
BUN: 172 mg/dL — ABNORMAL HIGH (ref 6–20)
BUN: 163 mg/dL — ABNORMAL HIGH (ref 6–20)
CO2: 15 mmol/L — ABNORMAL LOW (ref 22–32)
CO2: 26 mmol/L (ref 22–32)
Calcium: 6 mg/dL — CL (ref 8.9–10.3)
Calcium: 5.8 mg/dL — CL (ref 8.9–10.3)
Chloride: 93 mmol/L — ABNORMAL LOW (ref 98–111)
Chloride: 91 mmol/L — ABNORMAL LOW (ref 98–111)
Creatinine, Ser: 11.84 mg/dL — ABNORMAL HIGH (ref 0.61–1.24)
Creatinine, Ser: 9.94 mg/dL — ABNORMAL HIGH (ref 0.61–1.24)
GFR calc Af Amer: 5 mL/min — ABNORMAL LOW (ref >60)
GFR calc Af Amer: 6 mL/min — ABNORMAL LOW (ref >60)
GFR calc non Af Amer: 4 mL/min — ABNORMAL LOW (ref >60)
GFR calc non Af Amer: 5 mL/min — ABNORMAL LOW (ref >60)
Glucose, Bld: 121 mg/dL — ABNORMAL HIGH (ref 70–99)
Glucose, Bld: 161 mg/dL — ABNORMAL HIGH (ref 70–99)
Phosphorus: 11.3 mg/dL — ABNORMAL HIGH (ref 2.5–4.6)
Phosphorus: 7 mg/dL — ABNORMAL HIGH (ref 2.5–4.6)
Potassium: 5 mmol/L (ref 3.5–5.1)
Potassium: 3.6 mmol/L (ref 3.5–5.1)
Sodium: 133 mmol/L — ABNORMAL LOW (ref 135–145)
Sodium: 135 mmol/L (ref 135–145)

## 2019-06-12 LAB — ANA W/REFLEX IF POSITIVE: Anti Nuclear Antibody (ANA): NEGATIVE

## 2019-06-12 LAB — GLUCOSE, CAPILLARY: Glucose-Capillary: 152 mg/dL — ABNORMAL HIGH (ref 70–99)

## 2019-06-12 LAB — PROTIME-INR
INR: 1.3 — ABNORMAL HIGH (ref 0.8–1.2)
Prothrombin Time: 16.3 seconds — ABNORMAL HIGH (ref 11.4–15.2)

## 2019-06-12 LAB — GLOMERULAR BASEMENT MEMBRANE ANTIBODIES: GBM Ab: 2 units (ref 0–20)

## 2019-06-12 LAB — C4 COMPLEMENT: Complement C4, Body Fluid: 19 mg/dL (ref 12–38)

## 2019-06-12 LAB — C3 COMPLEMENT: C3 Complement: 132 mg/dL (ref 82–167)

## 2019-06-12 LAB — MPO/PR-3 (ANCA) ANTIBODIES
ANCA Proteinase 3: <3.5 U/mL (ref 0.0–3.5)
Myeloperoxidase Abs: <9 U/mL (ref 0.0–9.0)

## 2019-06-12 LAB — PROCALCITONIN: Procalcitonin: 1.42 ng/mL

## 2019-06-12 LAB — PHOSPHORUS: Phosphorus: 9 mg/dL — ABNORMAL HIGH (ref 2.5–4.6)

## 2019-06-12 LAB — TRIGLYCERIDES: Triglycerides: 158 mg/dL — ABNORMAL HIGH (ref <150)

## 2019-06-12 LAB — PARATHYROID HORMONE, INTACT (NO CA): PTH: 181 pg/mL — ABNORMAL HIGH (ref 15–65)

## 2019-06-12 LAB — MAGNESIUM: Magnesium: 2.5 mg/dL — ABNORMAL HIGH (ref 1.7–2.4)

## 2019-06-12 IMAGING — DX DG CHEST 1V PORT
1 series · 1 of 1 positions shown · non-contrast
Comparison: [DATE], [DATE] p.m.

CLINICAL DATA: Central line, gastric tube, intubation

EXAM:
PORTABLE CHEST 1 VIEW

[chest ap]
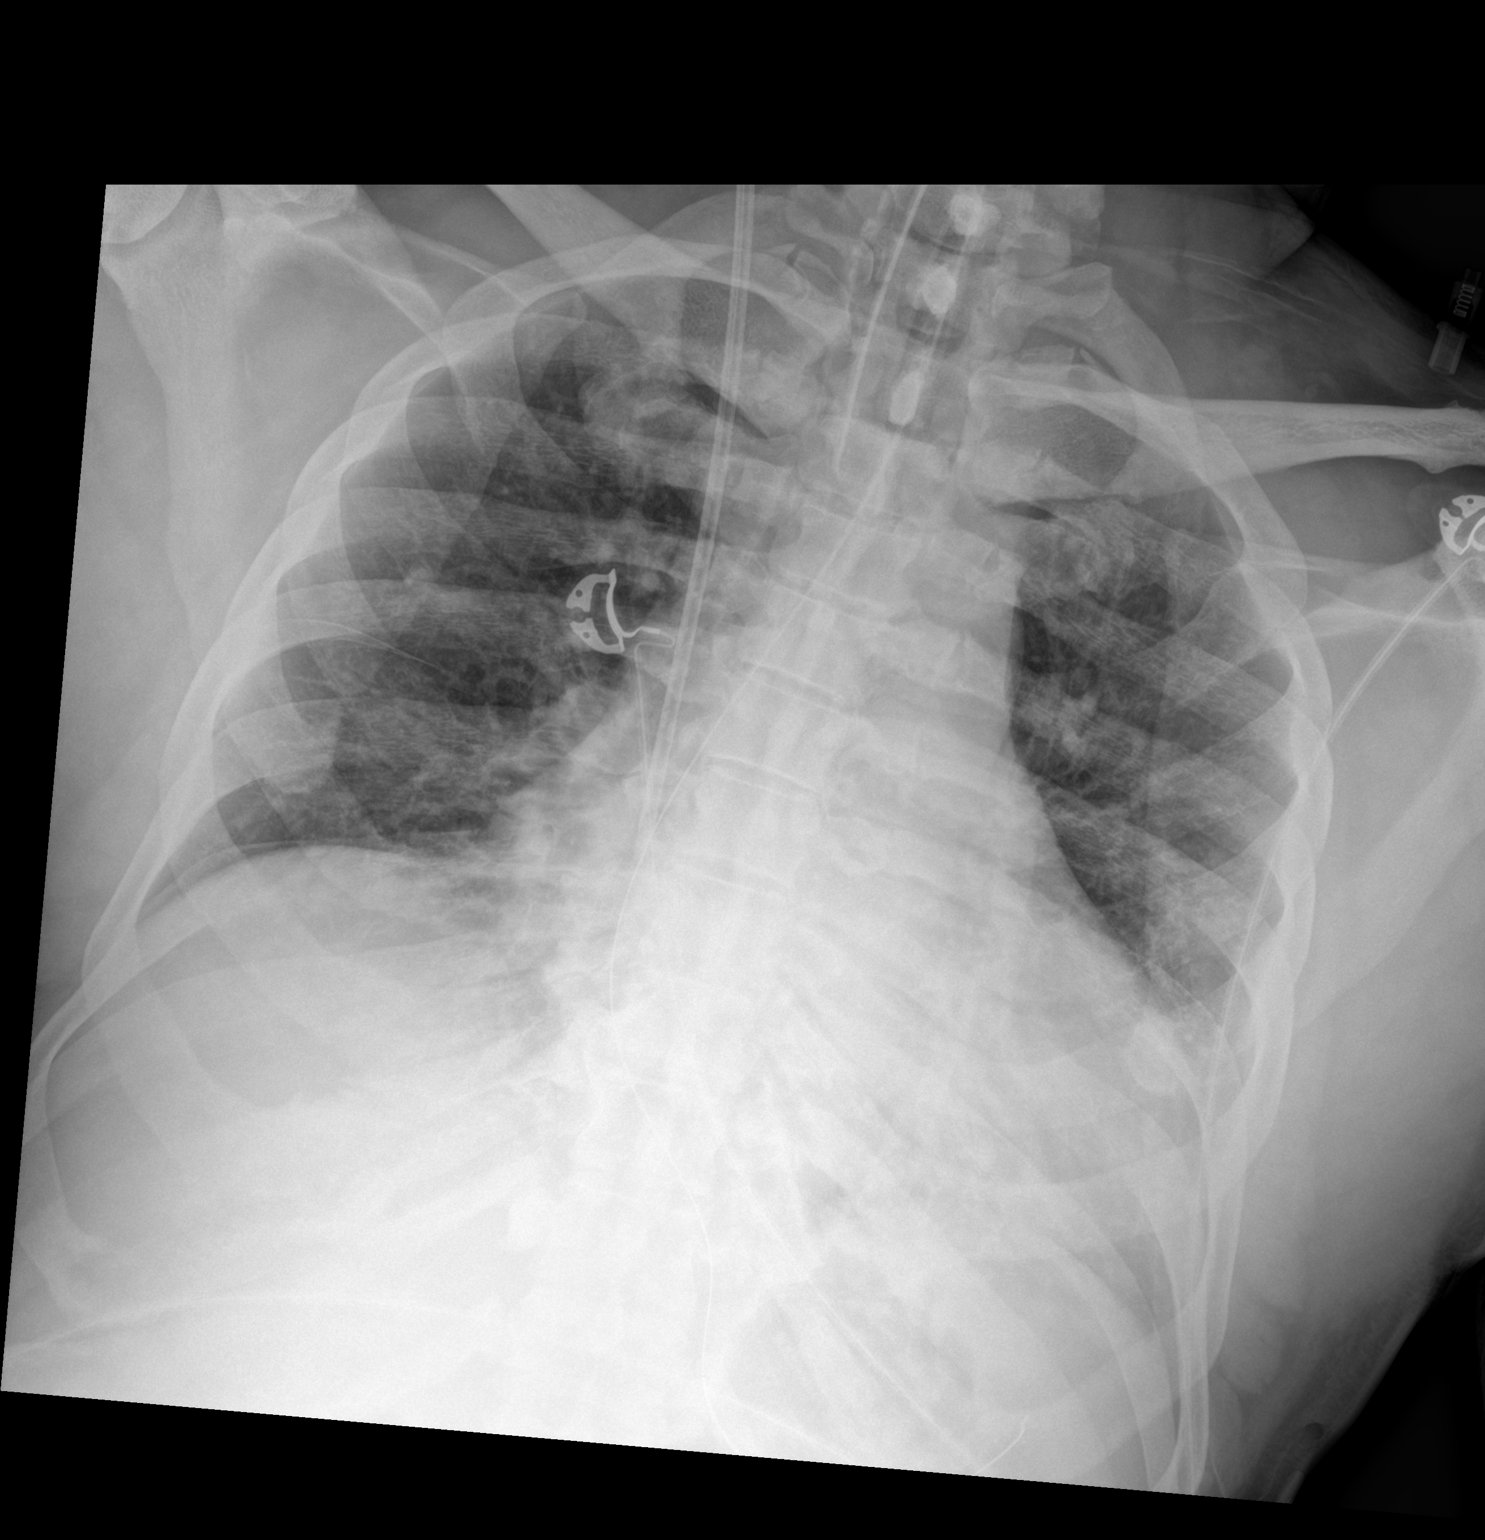

[1 of 1 positions shown; findings below may reference images not displayed]

FINDINGS: Interval placement of endotracheal tube, tip below the thoracic
inlet over the mid trachea. Esophagogastric tube with tip and side
port below the diaphragm. Right neck vascular catheter, tip
projecting over the right atrium. Unchanged cardiomegaly. No acute
abnormality of the lungs.
IMPRESSION: 1. Interval placement of endotracheal tube, tip below the thoracic
inlet over the mid trachea.

2.  Esophagogastric tube with tip and side port below the diaphragm.

3. Right neck vascular catheter, tip projecting over the right
atrium.

4.  Unchanged cardiomegaly. No acute abnormality of the lungs.

## 2019-06-12 IMAGING — DX DG CHEST 1V PORT
1 series · 1 of 1 positions shown · non-contrast
Comparison: [DATE].

CLINICAL DATA: Pulmonary disease.

EXAM:
PORTABLE CHEST 1 VIEW

[chest ap]
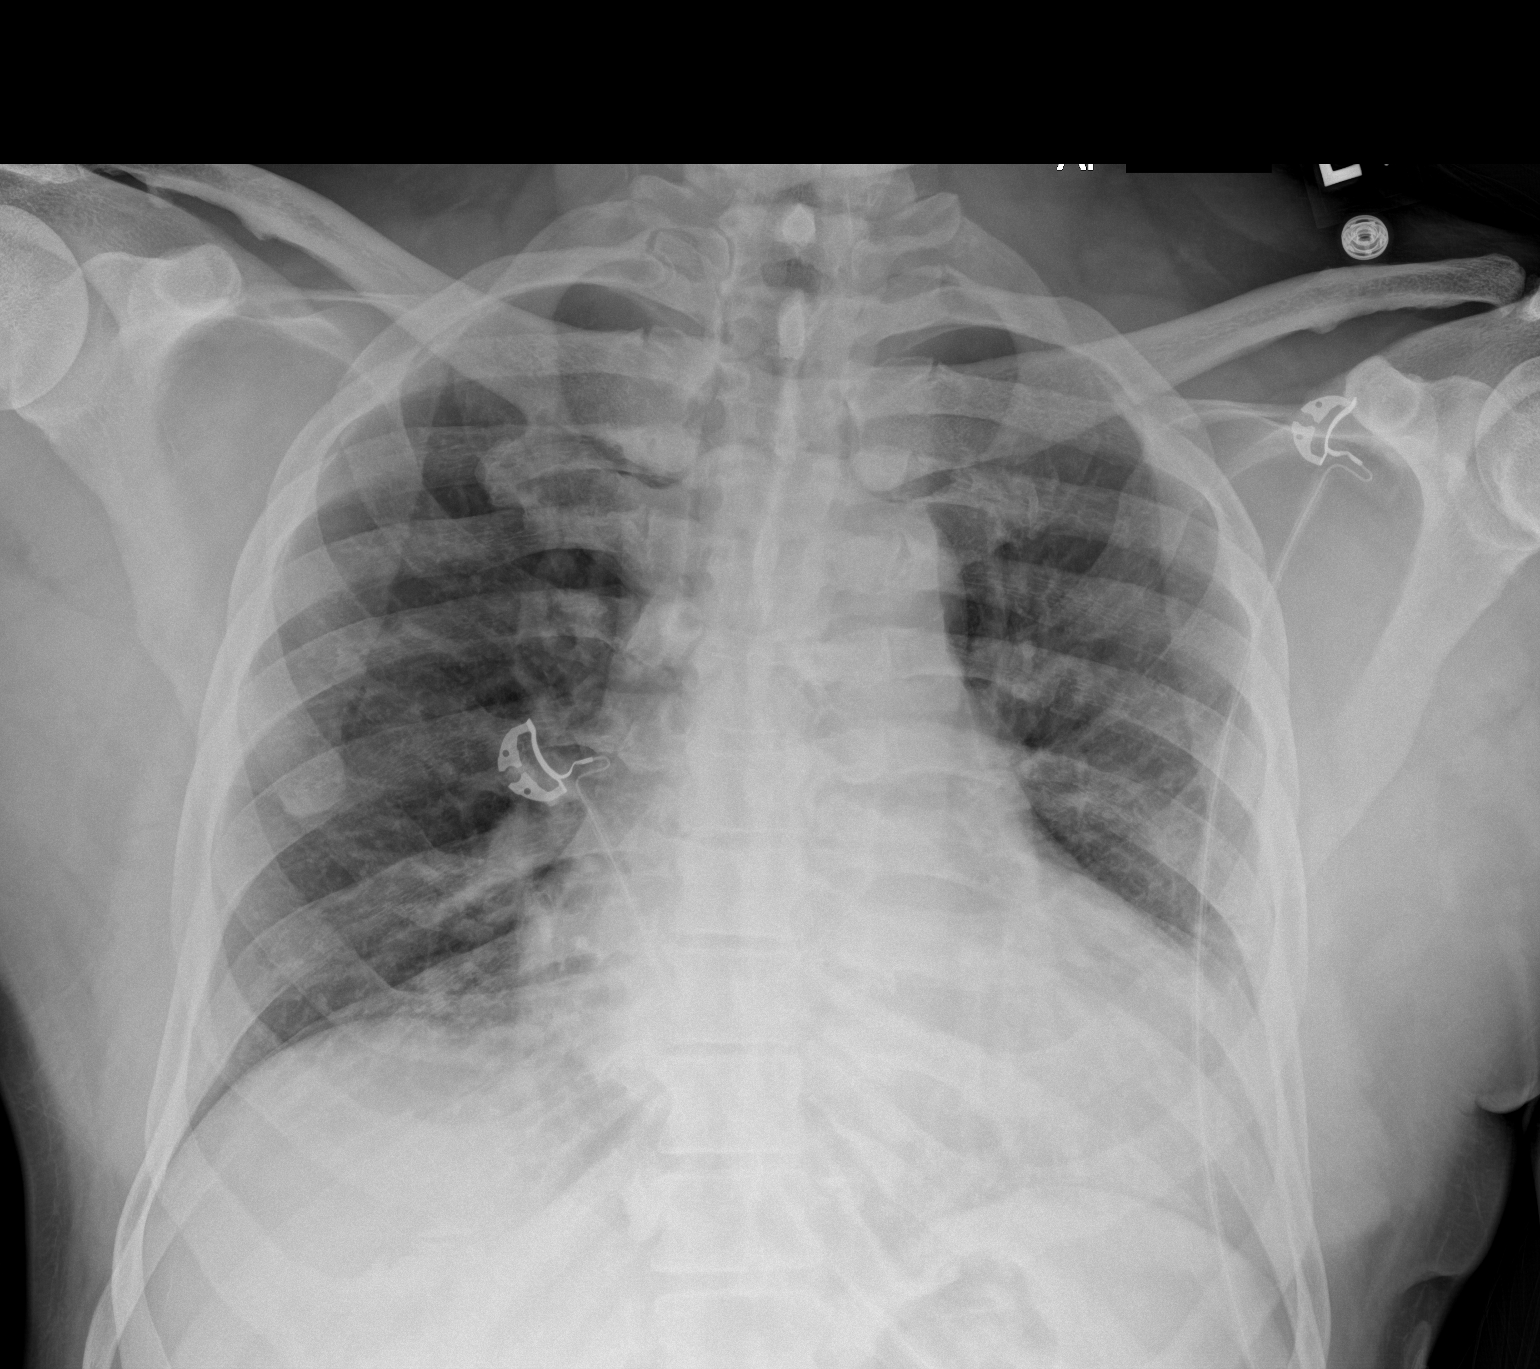

[1 of 1 positions shown; findings below may reference images not displayed]

FINDINGS: Mediastinum hilar structures are normal. Cardiomegaly. No pulmonary
venous congestion. Low lung volumes with mild right base
atelectasis. No pleural effusion or pneumothorax. No acute bony
abnormality
IMPRESSION: Stable cardiomegaly. No pulmonary venous congestion. Low lung
volumes with mild right base atelectasis.

## 2019-06-12 IMAGING — DX DG ABDOMEN 1V
1 series · 1 of 1 positions shown · non-contrast
Comparison: None.

CLINICAL DATA: Tube placement.

EXAM:
ABDOMEN - 1 VIEW

[abdomen erect]
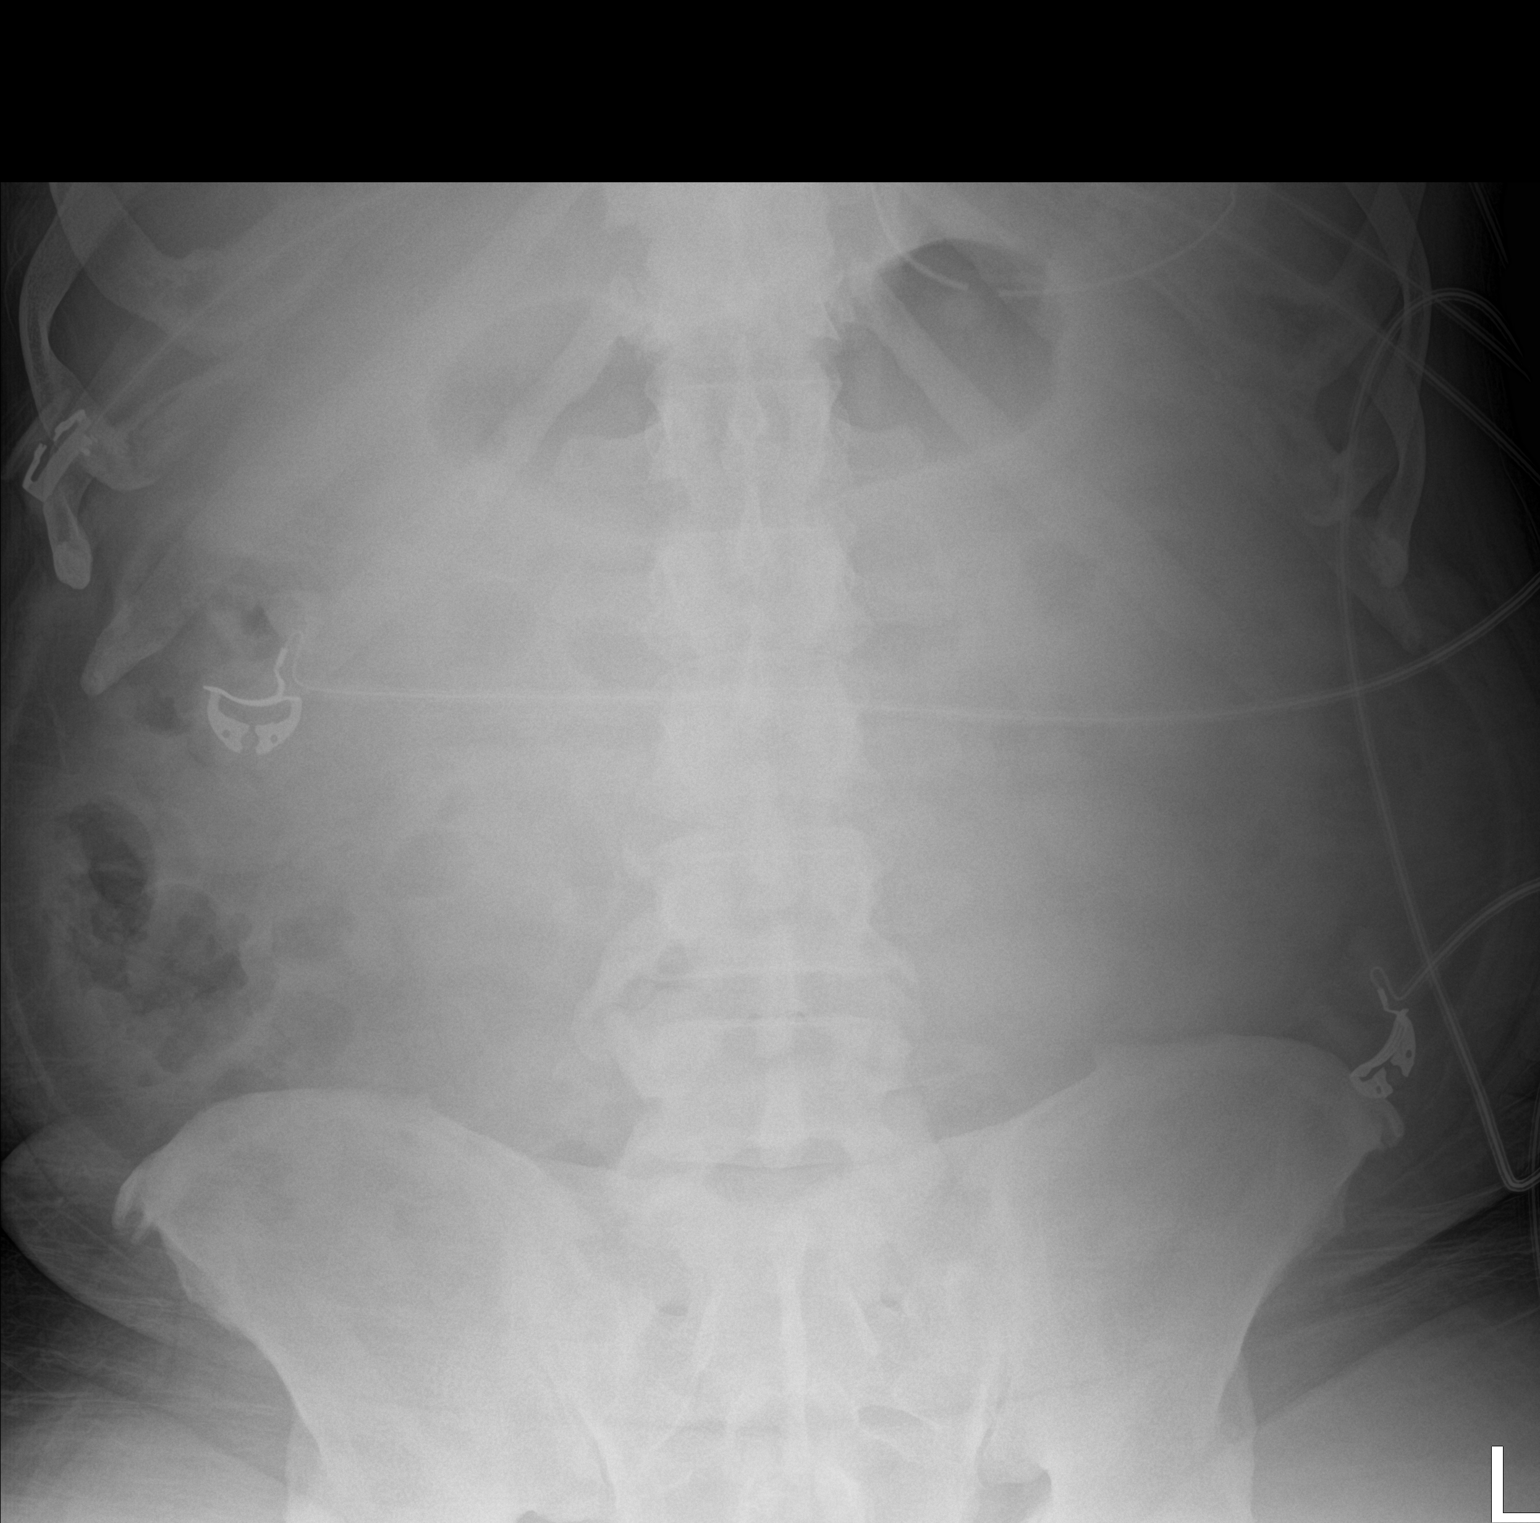

[1 of 1 positions shown; findings below may reference images not displayed]

FINDINGS: An enteric tube is present in the left upper quadrant in the
stomach.

Paucity of gas throughout the abdomen with nonspecific bowel gas
pattern, no clear signs of obstruction.

Spinal degenerative changes without acute bone finding.
IMPRESSION: An enteric tube is present in the left upper quadrant in the
stomach. Paucity of gas throughout the abdomen.

## 2019-06-12 MED ORDER — SODIUM CHLORIDE 0.9 % IV SOLN
1.0000 g | INTRAVENOUS | Status: DC
  Administered 2019-06-12 – 2019-06-16 (×5): 1 g via INTRAVENOUS
  Filled 2019-06-12: qty 1
  Filled 2019-06-12: qty 10
  Filled 2019-06-12 (×4): qty 1

## 2019-06-12 MED ORDER — CALCIUM GLUCONATE-NACL 1-0.675 GM/50ML-% IV SOLN
1.0000 g | Freq: Once | INTRAVENOUS | Status: AC
  Administered 2019-06-13: 1000 mg via INTRAVENOUS
  Filled 2019-06-12: qty 50

## 2019-06-12 MED ORDER — AMLODIPINE BESYLATE 10 MG PO TABS
10.0000 mg | ORAL_TABLET | Freq: Every day | ORAL | Status: DC
  Filled 2019-06-12: qty 1

## 2019-06-12 MED ORDER — SODIUM CHLORIDE 0.9 % IV SOLN
500.0000 mg | INTRAVENOUS | Status: DC
  Administered 2019-06-12: 500 mg via INTRAVENOUS
  Filled 2019-06-12 (×2): qty 500

## 2019-06-12 MED ORDER — ETOMIDATE 2 MG/ML IV SOLN
INTRAVENOUS | Status: AC
  Administered 2019-06-12: 10 mg via INTRAVENOUS
  Filled 2019-06-12: qty 10

## 2019-06-12 MED ORDER — INSULIN ASPART 100 UNIT/ML ~~LOC~~ SOLN
0.0000 [IU] | SUBCUTANEOUS | Status: DC
  Administered 2019-06-12: 2 [IU] via SUBCUTANEOUS
  Administered 2019-06-13 – 2019-06-14 (×3): 1 [IU] via SUBCUTANEOUS
  Filled 2019-06-12 (×3): qty 1

## 2019-06-12 MED ORDER — ROCURONIUM BROMIDE 50 MG/5ML IV SOLN
60.0000 mg | Freq: Once | INTRAVENOUS | Status: AC
  Administered 2019-06-12: 14:00:00 60 mg via INTRAVENOUS

## 2019-06-12 MED ORDER — VITAMIN B-1 100 MG PO TABS
100.0000 mg | ORAL_TABLET | Freq: Every day | ORAL | Status: DC
  Administered 2019-06-14: 100 mg
  Filled 2019-06-12: qty 1

## 2019-06-12 MED ORDER — ADULT MULTIVITAMIN W/MINERALS CH: 1.0000 | ORAL_TABLET | Freq: Every day | ORAL | Status: DC

## 2019-06-12 MED ORDER — SODIUM CHLORIDE 0.9 % IV SOLN
20.0000 ug | Freq: Once | INTRAVENOUS | Status: AC
  Administered 2019-06-12: 20 ug via INTRAVENOUS
  Filled 2019-06-12: qty 5

## 2019-06-12 MED ORDER — ROCURONIUM BROMIDE 50 MG/5ML IV SOLN
INTRAVENOUS | Status: AC
  Administered 2019-06-12: 60 mg via INTRAVENOUS
  Filled 2019-06-12: qty 2

## 2019-06-12 MED ORDER — CHLORHEXIDINE GLUCONATE 0.12% ORAL RINSE (MEDLINE KIT)
15.0000 mL | Freq: Two times a day (BID) | OROMUCOSAL | Status: DC
  Administered 2019-06-12 – 2019-06-17 (×7): 15 mL via OROMUCOSAL

## 2019-06-12 MED ORDER — PRISMASOL BGK 4/2.5 32-4-2.5 MEQ/L IV SOLN
INTRAVENOUS | Status: DC
  Administered 2019-06-12 – 2019-06-14 (×10): via INTRAVENOUS_CENTRAL
  Filled 2019-06-12: qty 5000

## 2019-06-12 MED ORDER — HEPARIN SODIUM (PORCINE) 1000 UNIT/ML DIALYSIS
1000.0000 [IU] | INTRAMUSCULAR | Status: DC | PRN
  Administered 2019-06-12: 2800 [IU] via INTRAVENOUS_CENTRAL
  Administered 2019-06-13: 3000 [IU] via INTRAVENOUS_CENTRAL
  Administered 2019-06-13: 2800 [IU] via INTRAVENOUS_CENTRAL
  Administered 2019-06-13: 1400 [IU] via INTRAVENOUS_CENTRAL
  Filled 2019-06-12 (×2): qty 6
  Filled 2019-06-12: qty 2
  Filled 2019-06-12 (×2): qty 6
  Filled 2019-06-12: qty 4
  Filled 2019-06-12: qty 1
  Filled 2019-06-12 (×2): qty 3
  Filled 2019-06-12: qty 6

## 2019-06-12 MED ORDER — SODIUM CHLORIDE 0.9 % IV SOLN
INTRAVENOUS | Status: DC | PRN
  Administered 2019-06-12: 500 mL via INTRAVENOUS
  Administered 2019-06-22: 250 mL via INTRAVENOUS

## 2019-06-12 MED ORDER — FENTANYL CITRATE (PF) 100 MCG/2ML IJ SOLN
25.0000 ug | INTRAMUSCULAR | Status: DC | PRN
  Administered 2019-06-14: 50 ug via INTRAVENOUS
  Filled 2019-06-12 (×2): qty 2

## 2019-06-12 MED ORDER — ETOMIDATE 2 MG/ML IV SOLN
10.0000 mg | Freq: Once | INTRAVENOUS | Status: AC
  Administered 2019-06-12: 14:00:00 10 mg via INTRAVENOUS

## 2019-06-12 MED ORDER — FOLIC ACID 1 MG PO TABS
1.0000 mg | ORAL_TABLET | Freq: Every day | ORAL | Status: DC
  Administered 2019-06-14: 1 mg
  Filled 2019-06-12: qty 1

## 2019-06-12 MED ORDER — HYDRALAZINE HCL 50 MG PO TABS
25.0000 mg | ORAL_TABLET | Freq: Three times a day (TID) | ORAL | Status: DC
  Filled 2019-06-12: qty 1

## 2019-06-12 MED ORDER — ORAL CARE MOUTH RINSE
15.0000 mL | OROMUCOSAL | Status: DC
  Administered 2019-06-12 – 2019-06-14 (×16): 15 mL via OROMUCOSAL

## 2019-06-12 MED ORDER — PRISMASOL BGK 4/2.5 32-4-2.5 MEQ/L REPLACEMENT SOLN
Status: DC
  Administered 2019-06-12 – 2019-06-14 (×6): via INTRAVENOUS_CENTRAL
  Filled 2019-06-12: qty 5000

## 2019-06-12 MED ORDER — CALCIUM GLUCONATE-NACL 2-0.675 GM/100ML-% IV SOLN
2.0000 g | Freq: Once | INTRAVENOUS | Status: AC
  Administered 2019-06-12: 2000 mg via INTRAVENOUS
  Filled 2019-06-12: qty 100

## 2019-06-12 MED ORDER — PROPOFOL 1000 MG/100ML IV EMUL
INTRAVENOUS | Status: AC
  Filled 2019-06-12: qty 100

## 2019-06-12 MED ORDER — PRISMASOL BGK 4/2.5 32-4-2.5 MEQ/L REPLACEMENT SOLN
Status: DC
  Administered 2019-06-12 – 2019-06-14 (×2): via INTRAVENOUS_CENTRAL
  Filled 2019-06-12: qty 5000

## 2019-06-12 MED ORDER — PROPOFOL 1000 MG/100ML IV EMUL
5.0000 ug/kg/min | INTRAVENOUS | Status: DC
  Administered 2019-06-12: 40 ug/kg/min via INTRAVENOUS
  Administered 2019-06-12: 45 ug/kg/min via INTRAVENOUS
  Administered 2019-06-13 (×2): 60 ug/kg/min via INTRAVENOUS
  Administered 2019-06-13: 70 ug/kg/min via INTRAVENOUS
  Administered 2019-06-13 (×4): 60 ug/kg/min via INTRAVENOUS
  Administered 2019-06-13: 59.972 ug/kg/min via INTRAVENOUS
  Administered 2019-06-14: 70 ug/kg/min via INTRAVENOUS
  Administered 2019-06-14 (×3): 60 ug/kg/min via INTRAVENOUS
  Filled 2019-06-12 (×14): qty 100

## 2019-06-12 NOTE — Progress Notes (Signed)
Report given to Milton, Therapist, sports.

## 2019-06-12 NOTE — Progress Notes (Addendum)
VAST consulted to place IV as pt currently has no access. Pt has had 7 PIV's since 11/1, most of which he has pulled out. Plan is for pt to receive CRRT upon placement of temporary Morgan, but currently needs administration of Sodium Bicarb drip.  Spoke with pt's nurse. She stated pt has had mitts on and off, but that she will be placing mitts back on pt's hands with IV placement. Educated importance of monitoring patient and guarding IV site with pt's status and plan of care with need for vein preservation; she verbalized understanding.  Pt currently eating lunch and needing to use restroom. VAST RN will return to obtain IV access once pt is settled.

## 2019-06-12 NOTE — Procedures (Signed)
Endotracheal Intubation: Patient required placement of an artificial airway secondary to Respiratory Failure  Consent: POA Martin Diaz  Hand washing performed prior to starting the procedure.   Medications administered for sedation prior to procedure:  Etomidate 20,  Rocuronium 60 mg IV   A time out procedure was called and correct patient, name, & ID confirmed. Needed supplies and equipment were assembled and checked to include ETT, 10 ml syringe, Glidescope, Mac and Miller blades, suction, oxygen and bag mask valve, end tidal CO2 monitor.   Patient was positioned to align the mouth and pharynx to facilitate visualization of the glottis.   Heart rate, SpO2 and blood pressure was continuously monitored during the procedure. Pre-oxygenation was conducted prior to intubation and endotracheal tube was placed through the vocal cords into the trachea.     The artificial airway was placed under direct visualization via glidescope route using a 7.5 ETT on the first attempt.  ETT was secured at 24 cm mark.  Placement was confirmed by auscuitation of lungs with good breath sounds bilaterally and no stomach sounds.  Condensation was noted on endotracheal tube.   Pulse ox 98%.  CO2 detector in place with appropriate color change.   Complications: None .   Operator: Lanney Gins MD.   Chest radiograph ordered and pending.   Comments: OGT placed via glidescope.   Martin Diaz, M.D.  [facility] [place]

## 2019-06-12 NOTE — Consult Note (Signed)
CRITICAL CARE PROGRESS NOTE    Name: Martin Diaz MRN: [id number] DOB: December 04, 1960     LOS: 2   SUBJECTIVE FINDINGS & SIGNIFICANT EVENTS   Patient description:  This is a 58 year old male from group home with cognitive impairment at baseline and speech delay, history of smoking and alcohol abuse who came in to the hospital for diarrhea nausea vomiting.  Patient is poor historian due to underlying cognitive impairment unable to provide details of recent events.  He was found to have acute renal failure with creatinine over 11 and severe uremia as well as metabolic acidosis this has progressed.  Patient was initially admitted to hospitalist service, was subsequently found to have signs of rhabdomyolysis with CPK over 23,000.  Patient was moved to the medical intensive care unit and consultation was placed to critical care service by Dr. Hollie Salk for further management.  I have discussed his care plan with nephrologist who recommends urgent CRRT.   Lines / Drains: Peripheral IV x2  Cultures / Sepsis markers: COVID-19 negative  Antibiotics: Empiric Rocephin and Zithromax   Protocols / Consultants: Hospitalist consultation, critical care consultation, nephrology consultation    PAST MEDICAL HISTORY   Past Medical History:  Diagnosis Date  . Alcohol use      SURGICAL HISTORY   History reviewed. No pertinent surgical history.   FAMILY HISTORY   History reviewed. No pertinent family history.   SOCIAL HISTORY   Social History   Tobacco Use  . Smoking status: Never Smoker  . Smokeless tobacco: Never Used  Substance Use Topics  . Alcohol use: Yes    Comment: says he hasn't been drinking  . Drug use: Not Currently     MEDICATIONS   Current Medication:  Current Facility-Administered  Medications:  .   prismasol BGK 4/2.5 infusion, , CRRT, Continuous, Lateef, Munsoor, MD .   prismasol BGK 4/2.5 infusion, , CRRT, Continuous, Lateef, Munsoor, MD .  amLODipine (NORVASC) tablet 10 mg, 10 mg, Oral, Daily, Jennye Boroughs, MD .  calcium gluconate 2 g/ 100 mL sodium chloride IVPB, 2 g, Intravenous, Once, Merlene Laughter F, NP .  Chlorhexidine Gluconate Cloth 2 % PADS 6 each, 6 each, Topical, Daily, Jennye Boroughs, MD, 6 each at 06/11/19 1228 .  folic acid (FOLVITE) tablet 1 mg, 1 mg, Oral, Daily, Posey Pronto, Vishal R, MD, 1 mg at 06/11/19 0916 .  heparin injection 1,000-6,000 Units, 1,000-6,000 Units, CRRT, PRN, Lateef, Munsoor, MD .  heparin injection 5,000 Units, 5,000 Units, Subcutaneous, Q8H, Lenore Cordia, MD, 5,000 Units at 06/12/19 0506 .  hydrALAZINE (APRESOLINE) injection 10 mg, 10 mg, Intravenous, Q4H PRN, Merlene Laughter F, NP, 10 mg at 06/12/19 1119 .  hydrALAZINE (APRESOLINE) tablet 25 mg, 25 mg, Oral, Q8H, Jennye Boroughs, MD .  labetalol (NORMODYNE) injection 10 mg, 10 mg, Intravenous, Q4H PRN, Merlene Laughter F, NP, 10 mg at 06/12/19 [id number] .  LORazepam (ATIVAN) tablet 1-4 mg, 1-4 mg, Oral, Q1H PRN **OR** LORazepam (ATIVAN) injection 1-4 mg, 1-4 mg, Intravenous, Q1H PRN, Lenore Cordia, MD, 2 mg at 06/12/19 0004 .  multivitamin with minerals tablet 1 tablet, 1 tablet, Oral, Daily, Lenore Cordia, MD, 1 tablet at 06/11/19 0916 .  ondansetron (ZOFRAN) tablet 4 mg, 4 mg, Oral, Q6H PRN **OR** ondansetron (ZOFRAN) injection 4 mg, 4 mg, Intravenous, Q6H PRN, Patel, Vishal R, MD .  prismasol BGK 4/2.5 infusion, , CRRT, Continuous, Lateef, Munsoor, MD .  sodium bicarbonate 150 mEq in dextrose 5% 1000 mL infusion, 150 mEq,  Intravenous, Continuous, Lateef, Munsoor, MD, Last Rate: 125 mL/hr at 06/12/19 0837, 150 mEq at 06/12/19 0837 .  sodium chloride flush (NS) 0.9 % injection 3 mL, 3 mL, Intravenous, Once, Monks, Sarah L., MD .  sodium chloride flush (NS) 0.9 % injection 3 mL, 3 mL,  Intravenous, Q12H, Zada Finders R, MD, 3 mL at 06/12/19 0907 .  thiamine (VITAMIN B-1) tablet 100 mg, 100 mg, Oral, Daily, 100 mg at 06/11/19 0916 **OR** thiamine (B-1) injection 100 mg, 100 mg, Intravenous, Daily, Zada Finders R, MD, 100 mg at 06/12/19 [id number]    ALLERGIES   Patient has no known allergies.    REVIEW OF SYSTEMS     Unable to obtain ROS due to cognitive impairment and altered mental status due to metabolic encephalopathy  PHYSICAL EXAMINATION   Vital Signs: Temp:  [97.7 F (36.5 C)-99.2 F (37.3 C)] 98.6 F (37 C) (11/03 0800) Pulse Rate:  [77-111] 92 (11/03 0700) Resp:  [14-27] 14 (11/03 1000) BP: (155-208)/(65-148) 173/86 (11/03 1100) SpO2:  [96 %-100 %] 98 % (11/03 0800) Weight:  [92.3 kg-95.6 kg] 95.6 kg (11/03 0500)  GENERAL: Confused, age-appropriate well-nourished HEAD: Normocephalic, atraumatic.  EYES: Pupils equal, round, reactive to light.  No scleral icterus.  MOUTH: Moist mucosal membrane. NECK: Supple. No thyromegaly. No nodules. No JVD.  PULMONARY: Crackles at the bases bilaterally CARDIOVASCULAR: S1 and S2. Regular rate and rhythm. No murmurs, rubs, or gallops.  GASTROINTESTINAL: Soft, nontender, non-distended. No masses. Positive bowel sounds. No hepatosplenomegaly.  MUSCULOSKELETAL: No swelling, clubbing, or edema.  NEUROLOGIC: Mild distress due to acute illness SKIN:intact,warm,dry   PERTINENT DATA     Infusions: .  prismasol BGK 4/2.5    .  prismasol BGK 4/2.5    . calcium gluconate    . prismasol BGK 4/2.5    . sodium bicarbonate 150 mEq in dextrose 5% 1000 mL 150 mEq (06/12/19 0837)   Scheduled Medications: . amLODipine  10 mg Oral Daily  . Chlorhexidine Gluconate Cloth  6 each Topical Daily  . folic acid  1 mg Oral Daily  . heparin  5,000 Units Subcutaneous Q8H  . hydrALAZINE  25 mg Oral Q8H  . multivitamin with minerals  1 tablet Oral Daily  . sodium chloride flush  3 mL Intravenous Once  . sodium chloride flush  3 mL  Intravenous Q12H  . thiamine  100 mg Oral Daily   Or  . thiamine  100 mg Intravenous Daily   PRN Medications: heparin, hydrALAZINE, labetalol, LORazepam **OR** LORazepam, ondansetron **OR** ondansetron (ZOFRAN) IV Hemodynamic parameters:   Intake/Output: 11/02 0701 - 11/03 0700 In: 3730.9 [I.V.:3730.9] Out: 1050 [Urine:1050]  Ventilator  Settings:      LAB RESULTS:  Basic Metabolic Panel: Recent Labs  Lab 06/10/19 1907 06/11/19 0421 06/12/19 0508  NA 130* 133* 134*  K 4.4 5.0 3.9  CL 90* 93* 90*  CO2 17* 15* 21*  GLUCOSE 130* 121* 156*  BUN 188* 172* 193*  CREATININE 11.56* 11.84* 11.89*  CALCIUM 6.4* 6.0* 5.3*  MG  --   --  2.5*  PHOS  --  11.3* 9.0*   Liver Function Tests: Recent Labs  Lab 06/10/19 1907 06/11/19 0421 06/12/19 0508  AST 450* 327* 156*  ALT 237* 208* 141*  ALKPHOS 77 71 64  BILITOT 1.0 1.4* 1.1  PROT 7.9 7.3 6.4*  ALBUMIN 3.5 3.4*  3.3* 2.9*   Recent Labs  Lab 06/10/19 1907  LIPASE 339*   Recent Labs  Lab 06/10/19 2058  AMMONIA 22   CBC: Recent Labs  Lab 06/10/19 1907 06/11/19 0421 06/12/19 0508  WBC 9.3 8.4 5.6  NEUTROABS  --   --  4.0  HGB 15.2 14.2 12.6*  HCT 44.6 40.9 35.3*  MCV 83.4 82.6 80.6  PLT 201 186 163   Cardiac Enzymes: Recent Labs  Lab 06/11/19 1451  CKTOTAL 23,960*   BNP: Invalid input(s): POCBNP CBG: Recent Labs  Lab 06/11/19 1200  GLUCAP 81     IMAGING RESULTS:  Imaging: US Renal  Result Date: 06/10/2019 CLINICAL DATA:  Renal failure EXAM: RENAL / URINARY TRACT ULTRASOUND COMPLETE COMPARISON:  None at FINDINGS: Right Kidney: Renal measurements: 13.2 x 7.4 x 6.4 cm = volume: 325 mL. Normal cortical thickness. Increased cortical echogenicity. No mass, hydronephrosis, or shadowing calcification. Left Kidney: Renal measurements: 14.4 x 5.9 x 5.6 cm = volume: 248 mL. Normal cortical thickness. Increased cortical echogenicity. Exophytic nodule from lateral upper to mid LEFT kidney 2.2 x 1.7 x  1.8 cm, containing low level internal echoes, question solid versus complex cystic. No additional mass, hydronephrosis or shadowing calcification. Bladder: Appears normal for degree of bladder distention. Other: N/A IMPRESSION: Medical renal disease changes of both kidneys. Exophytic indeterminate nodule lateral aspect of upper to mid LEFT kidney 2.2 cm greatest size; this could represent a solid or a complex cystic lesion and further characterization by MR imaging is recommended. Electronically Signed   By: Lavonia Dana M.D.   On: 06/10/2019 23:43   US Abdomen Limited Ruq  Result Date: 06/10/2019 CLINICAL DATA:  Abdominal pain.  Elevated lipase. EXAM: ULTRASOUND ABDOMEN LIMITED RIGHT UPPER QUADRANT COMPARISON:  None. FINDINGS: Gallbladder: No gallstones or wall thickening visualized. No sonographic Murphy sign noted by sonographer. Common bile duct: Diameter: 5 mm, within normal limits. Liver: No focal lesion identified. Within normal limits in parenchymal echogenicity. Portal vein is patent on color Doppler imaging with normal direction of blood flow towards the liver. Other: Increased echogenicity of right renal parenchyma noted, consistent with medical renal disease. IMPRESSION: No evidence of gallstones or other hepatobiliary abnormality. Increased parenchymal echogenicity of right kidney, consistent with medical renal disease. Electronically Signed   By: Marlaine Hind M.D.   On: 06/10/2019 21:45      ASSESSMENT AND PLAN    -Multidisciplinary rounds held today  Altered mental status with lethargy -Likely due to metabolic encephalopathy with severe uremia -Concern for loss of airway protection -High risk for intubation -Empirically on Rocephin and Zithromax due to abnormal lung auscultation   Renal Failure-stage IV -Likely due to rhabdomyolysis -Nephrology on case-appreciate input -Plan for dialysis catheter placement and CRRT -Severe uremia high risk for bleeding-DDAVP-appreciate  pharmacologist collaboration -follow chem 7 -follow UO -continue Foley Catheter-assess need daily   Rhabdomyolysis -Unable to infuse aggressive resuscitation due to volume overload with renal failure -Nephrology on case-appreciate input -Multiple electrolyte abnormalities-plan for CRRT      ID -continue IV abx as prescibed -follow up cultures  GI/Nutrition GI PROPHYLAXIS as indicated DIET-->TF's as tolerated Constipation protocol as indicated  ENDO - ICU hypoglycemic\Hyperglycemia protocol -check FSBS per protocol   ELECTROLYTES -follow labs as needed -replace as needed -pharmacy consultation   DVT/GI PRX ordered -SCDs  TRANSFUSIONS AS NEEDED MONITOR FSBS ASSESS the need for LABS as needed   Critical care provider statement:    Critical care time (minutes):  32   Critical care time was exclusive of:  Separately billable procedures and treating other patients   Critical care was necessary to treat or  prevent imminent or life-threatening deterioration of the following conditions:   Altered mental status with lethargy, metabolic encephalopathy, rhabdomyolysis, stage IV acute kidney failure, multiple comorbid conditions   Critical care was time spent personally by me on the following activities:  Development of treatment plan with patient or surrogate, discussions with consultants, evaluation of patient's response to treatment, examination of patient, obtaining history from patient or surrogate, ordering and performing treatments and interventions, ordering and review of laboratory studies and re-evaluation of patient's condition.  I assumed direction of critical care for this patient from another provider in my specialty: no    This document was prepared using Dragon voice recognition software and may include unintentional dictation errors.    Ottie Glazier, M.D.  Division of [place]

## 2019-06-12 NOTE — Progress Notes (Signed)
Order clarification. MD Lateef called for filter and flow rates. MD ordered 2000 Dialysate, 1500 pre filter and 500 post.  Filter M150.

## 2019-06-12 NOTE — Progress Notes (Signed)
[facility]  ROUNDING NOTE   Subjective:  Patient is lethargic but arousable. BUN quite high at 193 with a creatinine of 11.89. However patient is producing urine and urine output was approximately 1 L over the preceding 24 hours. Serum potassium currently 3.9. Serum bicarbonate also improved to 21.   Objective:  Vital signs in last 24 hours:  Temp:  [97.7 F (36.5 C)-99.2 F (37.3 C)] 98.6 F (37 C) (11/03 0800) Pulse Rate:  [77-111] 92 (11/03 0700) Resp:  [14-27] 14 (11/03 1000) BP: (155-208)/(65-148) 173/86 (11/03 1100) SpO2:  [96 %-100 %] 98 % (11/03 0800) Weight:  [92.3 kg-95.6 kg] 95.6 kg (11/03 0500)  Weight change: -21.1 kg Filed Weights   06/10/19 1900 06/11/19 1208 06/12/19 0500  Weight: 113.4 kg 92.3 kg 95.6 kg    Intake/Output: I/O last 3 completed shifts: In: 3730.9 [I.V.:3730.9] Out: 1050 [Urine:1050]   Intake/Output this shift:  Total I/O In: 240.7 [I.V.:240.7] Out: 50 [Urine:50]  Physical Exam: General: Critically ill-appearing  Head: Normocephalic, atraumatic.  Dry oral mucosa.  Eyes: Anicteric  Neck: Supple, trachea midline  Lungs:  Clear to auscultation, normal effort  Heart: S1S2 no obvious pericardial friction rub.  Abdomen:  Soft, nontender, bowel sounds present  Extremities: No peripheral edema.  Neurologic: Lethargic but arousable  Skin: No lesions  GU: Foley catheter in place    Basic Metabolic Panel: Recent Labs  Lab 06/10/19 1907 06/11/19 0421 06/12/19 0508  NA 130* 133* 134*  K 4.4 5.0 3.9  CL 90* 93* 90*  CO2 17* 15* 21*  GLUCOSE 130* 121* 156*  BUN 188* 172* 193*  CREATININE 11.56* 11.84* 11.89*  CALCIUM 6.4* 6.0* 5.3*  MG  --   --  2.5*  PHOS  --  11.3* 9.0*    Liver Function Tests: Recent Labs  Lab 06/10/19 1907 06/11/19 0421 06/12/19 0508  AST 450* 327* 156*  ALT 237* 208* 141*  ALKPHOS 77 71 64  BILITOT 1.0 1.4* 1.1  PROT 7.9 7.3 6.4*  ALBUMIN 3.5 3.4*  3.3* 2.9*   Recent Labs  Lab  06/10/19 1907  LIPASE 339*   Recent Labs  Lab 06/10/19 2058  AMMONIA 22    CBC: Recent Labs  Lab 06/10/19 1907 06/11/19 0421 06/12/19 0508  WBC 9.3 8.4 5.6  NEUTROABS  --   --  4.0  HGB 15.2 14.2 12.6*  HCT 44.6 40.9 35.3*  MCV 83.4 82.6 80.6  PLT 201 186 163    Cardiac Enzymes: Recent Labs  Lab 06/11/19 1451  CKTOTAL 23,960*    BNP: Invalid input(s): POCBNP  CBG: Recent Labs  Lab 06/11/19 1200  GLUCAP 81    Microbiology: Results for orders placed or performed during the hospital encounter of 06/10/19  SARS Coronavirus 2 by RT PCR (hospital order, performed in [place] hospital lab)     Status: None   Collection Time: 06/10/19 10:02 PM  Result Value Ref Range Status   SARS Coronavirus 2 NEGATIVE NEGATIVE Final    Comment: (NOTE) If result is NEGATIVE SARS-CoV-2 target nucleic acids are NOT DETECTED. The SARS-CoV-2 RNA is generally detectable in upper and lower  respiratory specimens during the acute phase of infection. The lowest  concentration of SARS-CoV-2 viral copies this assay can detect is 250  copies / mL. A negative result does not preclude SARS-CoV-2 infection  and should not be used as the sole basis for treatment or other  patient management decisions.  A negative result may occur with  improper specimen collection / handling, submission of specimen other  than nasopharyngeal swab, presence of viral mutation(s) within the  areas targeted by this assay, and inadequate number of viral copies  (<250 copies / mL). A negative result must be combined with clinical  observations, patient history, and epidemiological information. If result is POSITIVE SARS-CoV-2 target nucleic acids are DETECTED. The SARS-CoV-2 RNA is generally detectable in upper and lower  respiratory specimens dur ing the acute phase of infection.  Positive  results are indicative of active infection with SARS-CoV-2.  Clinical  correlation with patient history and other  diagnostic information is  necessary to determine patient infection status.  Positive results do  not rule out bacterial infection or co-infection with other viruses. If result is PRESUMPTIVE POSTIVE SARS-CoV-2 nucleic acids MAY BE PRESENT.   A presumptive positive result was obtained on the submitted specimen  and confirmed on repeat testing.  While Nov 08, 2017 novel coronavirus  (SARS-CoV-2) nucleic acids may be present in the submitted sample  additional confirmatory testing may be necessary for epidemiological  and / or clinical management purposes  to differentiate between  SARS-CoV-2 and other Sarbecovirus currently known to infect humans.  If clinically indicated additional testing with an alternate test  methodology [phone]) is advised. The SARS-CoV-2 RNA is generally  detectable in upper and lower respiratory sp ecimens during the acute  phase of infection. The expected result is Negative. Fact Sheet for Patients:  StrictlyIdeas.no Fact Sheet for Healthcare Providers: BankingDealers.co.za This test is not yet approved or cleared by the [place] FDA and has been authorized for detection and/or diagnosis of SARS-CoV-2 by FDA under an Emergency Use Authorization (EUA).  This EUA will remain in effect (meaning this test can be used) for the duration of the COVID-19 declaration under Section 564(b)(1) of the Act, 21 U.S.C. section 360bbb-3(b)(1), unless the authorization is terminated or revoked sooner. Performed at [facility], [place] [address] [place], [place] [address]   MRSA PCR Screening     Status: None   Collection Time: 06/11/19 12:30 PM   Specimen: Nasopharyngeal  Result Value Ref Range Status   MRSA by PCR NEGATIVE NEGATIVE Final    Comment:        The GeneXpert MRSA Assay (FDA approved for NASAL specimens only), is one component of a comprehensive MRSA colonization surveillance program. It is not intended to  diagnose MRSA infection nor to guide or monitor treatment for MRSA infections. Performed at [facility], [address] [place], [place] [address]     Coagulation Studies: Recent Labs    06/10/19 11-08-56  LABPROT 15.8*  INR 1.3*    Urinalysis: Recent Labs    06/11/19 0316  COLORURINE YELLOW*  LABSPEC 1.012  PHURINE 5.0  GLUCOSEU NEGATIVE  HGBUR LARGE*  BILIRUBINUR NEGATIVE  KETONESUR NEGATIVE  PROTEINUR 100*  NITRITE NEGATIVE  LEUKOCYTESUR SMALL*      Imaging: US Renal  Result Date: 06/10/2019 CLINICAL DATA:  Renal failure EXAM: RENAL / URINARY TRACT ULTRASOUND COMPLETE COMPARISON:  None at FINDINGS: Right Kidney: Renal measurements: 13.2 x 7.4 x 6.4 cm = volume: 325 mL. Normal cortical thickness. Increased cortical echogenicity. No mass, hydronephrosis, or shadowing calcification. Left Kidney: Renal measurements: 14.4 x 5.9 x 5.6 cm = volume: 248 mL. Normal cortical thickness. Increased cortical echogenicity. Exophytic nodule from lateral upper to mid LEFT kidney 2.2 x 1.7 x 1.8 cm, containing low level internal echoes, question solid versus complex cystic. No additional mass, hydronephrosis or shadowing  calcification. Bladder: Appears normal for degree of bladder distention. Other: N/A IMPRESSION: Medical renal disease changes of both kidneys. Exophytic indeterminate nodule lateral aspect of upper to mid LEFT kidney 2.2 cm greatest size; this could represent a solid or a complex cystic lesion and further characterization by MR imaging is recommended. Electronically Signed   By: Lavonia Dana M.D.   On: 06/10/2019 23:43   US Abdomen Limited Ruq  Result Date: 06/10/2019 CLINICAL DATA:  Abdominal pain.  Elevated lipase. EXAM: ULTRASOUND ABDOMEN LIMITED RIGHT UPPER QUADRANT COMPARISON:  None. FINDINGS: Gallbladder: No gallstones or wall thickening visualized. No sonographic Murphy sign noted by sonographer. Common bile duct: Diameter: 5 mm, within normal limits. Liver: No  focal lesion identified. Within normal limits in parenchymal echogenicity. Portal vein is patent on color Doppler imaging with normal direction of blood flow towards the liver. Other: Increased echogenicity of right renal parenchyma noted, consistent with medical renal disease. IMPRESSION: No evidence of gallstones or other hepatobiliary abnormality. Increased parenchymal echogenicity of right kidney, consistent with medical renal disease. Electronically Signed   By: Marlaine Hind M.D.   On: 06/10/2019 21:45     Medications:   . calcium gluconate    . sodium bicarbonate 150 mEq in dextrose 5% 1000 mL 150 mEq (06/12/19 0837)   . amLODipine  10 mg Oral Daily  . Chlorhexidine Gluconate Cloth  6 each Topical Daily  . folic acid  1 mg Oral Daily  . heparin  5,000 Units Subcutaneous Q8H  . hydrALAZINE  25 mg Oral Q8H  . multivitamin with minerals  1 tablet Oral Daily  . sodium chloride flush  3 mL Intravenous Once  . sodium chloride flush  3 mL Intravenous Q12H  . thiamine  100 mg Oral Daily   Or  . thiamine  100 mg Intravenous Daily   hydrALAZINE, labetalol, LORazepam **OR** LORazepam, ondansetron **OR** ondansetron (ZOFRAN) IV  Assessment/ Plan:  58 y.o. male with a PMHx of alcohol abuse and impaired cognitive function, who was admitted to [facility] on 06/10/2019 for evaluation of weakness, diarrhea, nausea, and vomiting.    1.  Acute kidney injury suspected secondary to prolonged volume depletion/rhabdomyolysis.  Patient with severe renal dysfunction.  Uremia may be resulting in his mental status now.  We will consult with critical care and discuss continuous renal placement therapy.  2.  Metabolic acidosis.  Partially improved.  Maintain the patient on sodium bicarbonate drip as he does have underlying rhabdomyolysis.  3.  Alcohol abuse.  Management as per hospitalist and critical care.   LOS: 2 Jamil Armwood 11/3/202011:19 AM

## 2019-06-12 NOTE — Procedures (Signed)
Central Venous Catheter Placement:  TRIPLE LUMEN DIALYSIS CATHETER Indication: Patient receiving vesicant or irritant drug.; Patient receiving intravenous therapy for longer than 5 days.; Patient has limited or no vascular access.   Consent:emergent    Hand washing performed prior to starting the procedure.   Procedure:   An active timeout was performed and correct patient, name, & ID confirmed.   Patient was positioned correctly for central venous access.  Patient was prepped using strict sterile technique including chlorohexadine preps, sterile drape, sterile gown and sterile gloves.    The area was prepped, draped and anesthetized in the usual sterile manner. Patient comfort was obtained.    A triple lumen catheter was placed in right Internal Jugular Vein There was good blood return, catheter caps were placed on lumens, catheter flushed easily, the line was secured and a sterile dressing and BIO-PATCH applied.   Ultrasound was used to visualize vasculature and guidance of catheter placement.   Number of Attempts: 1 Complications:none Estimated Blood Loss: none Chest Radiograph indicated and ordered.  Operator: Lanney Gins MD    Ottie Glazier, M.D.  [facility] [place]

## 2019-06-12 NOTE — Progress Notes (Signed)
Pt intubated at this time for airway protection. Pt started on propofol gtt fro sedation. Consent given by aunt. Setting up for trialysis cath placement.

## 2019-06-12 NOTE — Progress Notes (Addendum)
Progress Note    Martin Diaz  [id number] DOB: 1961/07/03  DOA: 06/10/2019 PCP: Patient, No Pcp Per     Assessment/Plan:   Principal Problem:   Acute renal failure (ARF) ([place]) Active Problems:   Pancreatitis, alcoholic, acute   Alcohol use   Hypocalcemia   Non-traumatic rhabdomyolysis   Body mass index is 25.65 kg/m.    Metabolic/uremic encephalopathy: Patient's mental status deteriorated in the course of the day.  He was seen in consultation by the intensivist and he was intubated and placed on mechanical ventilation because of worsening condition.  Severe acute renal failure with metabolic acidosis: BUN and creatinine have worsened.  Patient remained stable IV sodium bicarbonate infusion. Nephrologist is on board and plans are underway for continuous renal replacement therapy.  Monitor BMP.  Rhabdomyolysis: Continue IV hydration.  Monitor CK level  Elevated lipase: This may be due to acute kidney injury versus alcohol associated pancreatitis.  Repeat lipase  Hypocalcemia: Treated with IV calcium gluconate and monitor levels.  Severe hypophosphatemia: This is due to severe acute kidney injury.  Monitor phosphorus level  Hypertensive urgency: Amlodipine was increased from 5 to 10 mg daily and hydralazine was added for adequate BP control.  Elevated liver enzymes: May be due to alcohol use.  Liver enzymes are slowly improving.    Alcohol use disorder: Thiamine, folic acid and Ativan as needed  Hyponatremia: This is likely from acute renal failure. Monitor sodium level    Family Communication/Anticipated D/C date and plan/Code Status   DVT prophylaxis: Heparin Code Status: Full code Family Communication: None Disposition Plan: Unknown at this time      Subjective:   Patient unable to provide an adequate history but he denies any complaints.   Objective:    Vitals:   06/12/19 1400 06/12/19 1424 06/12/19 1500 06/12/19 1600  BP: (!) 203/90  (!)  176/95 (!) 153/86  Pulse: 85  (!) 104 (!) 103  Resp: 14   20  Temp: 98.9 F (37.2 C)   98.5 F (36.9 C)  TempSrc: Axillary   Axillary  SpO2: 98% 97% 98% 96%  Weight:      Height:        Intake/Output Summary (Last 24 hours) at 06/12/2019 1627 Last data filed at 06/12/2019 1614 Gross per 24 hour  Intake 2804.55 ml  Output 1350 ml  Net 1454.55 ml   Filed Weights   06/10/19 1900 06/11/19 1208 06/12/19 0500  Weight: 113.4 kg 92.3 kg 95.6 kg    Exam:  GEN: NAD SKIN: No rash EYES: no pallor or icterus ENT: MMM CV: RRR PULM: CTA B ABD: soft, ND, NT, +BS CNS: lethargic but he was arousable during my visit, non focal EXT: No edema or tenderness   Data Reviewed:   I have personally reviewed following labs and imaging studies:  Labs: Labs show the following:   Basic Metabolic Panel: Recent Labs  Lab 06/10/19 1907 06/11/19 0421 06/12/19 0508  NA 130* 133* 134*  K 4.4 5.0 3.9  CL 90* 93* 90*  CO2 17* 15* 21*  GLUCOSE 130* 121* 156*  BUN 188* 172* 193*  CREATININE 11.56* 11.84* 11.89*  CALCIUM 6.4* 6.0* 5.3*  MG  --   --  2.5*  PHOS  --  11.3* 9.0*   GFR Estimated Creatinine Clearance: 8.5 mL/min (A) (by C-G formula based on SCr of 11.89 mg/dL (H)). Liver Function Tests: Recent Labs  Lab 06/10/19 1907 06/11/19 0421 06/12/19 0508  AST 450* 327*  156*  ALT 237* 208* 141*  ALKPHOS 77 71 64  BILITOT 1.0 1.4* 1.1  PROT 7.9 7.3 6.4*  ALBUMIN 3.5 3.4*   3.3* 2.9*   Recent Labs  Lab 06/10/19 1907  LIPASE 339*   Recent Labs  Lab 06/10/19 2058  AMMONIA 22   Coagulation profile Recent Labs  Lab 06/10/19 2058 06/12/19 1138  INR 1.3* 1.3*    CBC: Recent Labs  Lab 06/10/19 1907 06/11/19 0421 06/12/19 0508  WBC 9.3 8.4 5.6  NEUTROABS  --   --  4.0  HGB 15.2 14.2 12.6*  HCT 44.6 40.9 35.3*  MCV 83.4 82.6 80.6  PLT 201 186 163   Cardiac Enzymes: Recent Labs  Lab 06/11/19 1451  CKTOTAL 23,960*   BNP (last 3 results) No results for  input(s): PROBNP in the last 8760 hours. CBG: Recent Labs  Lab 06/11/19 1200  GLUCAP 81   D-Dimer: No results for input(s): DDIMER in the last 72 hours. Hgb A1c: No results for input(s): HGBA1C in the last 72 hours. Lipid Profile: No results for input(s): CHOL, HDL, LDLCALC, TRIG, CHOLHDL, LDLDIRECT in the last 72 hours. Thyroid function studies: No results for input(s): TSH, T4TOTAL, T3FREE, THYROIDAB in the last 72 hours.  Invalid input(s): FREET3 Anemia work up: No results for input(s): VITAMINB12, FOLATE, FERRITIN, TIBC, IRON, RETICCTPCT in the last 72 hours. Sepsis Labs: Recent Labs  Lab 06/10/19 1907 06/11/19 0421 06/12/19 0508 06/12/19 1334  PROCALCITON  --   --   --  1.42  WBC 9.3 8.4 5.6  --     Microbiology Recent Results (from the past 240 hour(s))  SARS Coronavirus 2 by RT PCR (hospital order, performed in [place] hospital lab)     Status: None   Collection Time: 06/10/19 10:02 PM  Result Value Ref Range Status   SARS Coronavirus 2 NEGATIVE NEGATIVE Final    Comment: (NOTE) If result is NEGATIVE SARS-CoV-2 target nucleic acids are NOT DETECTED. The SARS-CoV-2 RNA is generally detectable in upper and lower  respiratory specimens during the acute phase of infection. The lowest  concentration of SARS-CoV-2 viral copies this assay can detect is 250  copies / mL. A negative result does not preclude SARS-CoV-2 infection  and should not be used as the sole basis for treatment or other  patient management decisions.  A negative result may occur with  improper specimen collection / handling, submission of specimen other  than nasopharyngeal swab, presence of viral mutation(s) within the  areas targeted by this assay, and inadequate number of viral copies  (<250 copies / mL). A negative result must be combined with clinical  observations, patient history, and epidemiological information. If result is POSITIVE SARS-CoV-2 target nucleic acids are DETECTED. The  SARS-CoV-2 RNA is generally detectable in upper and lower  respiratory specimens dur ing the acute phase of infection.  Positive  results are indicative of active infection with SARS-CoV-2.  Clinical  correlation with patient history and other diagnostic information is  necessary to determine patient infection status.  Positive results do  not rule out bacterial infection or co-infection with other viruses. If result is PRESUMPTIVE POSTIVE SARS-CoV-2 nucleic acids MAY BE PRESENT.   A presumptive positive result was obtained on the submitted specimen  and confirmed on repeat testing.  While 2019 novel coronavirus  (SARS-CoV-2) nucleic acids may be present in the submitted sample  additional confirmatory testing may be necessary for epidemiological  and / or clinical management purposes  to differentiate  between  SARS-CoV-2 and other Sarbecovirus currently known to infect humans.  If clinically indicated additional testing with an alternate test  methodology [phone]) is advised. The SARS-CoV-2 RNA is generally  detectable in upper and lower respiratory sp ecimens during the acute  phase of infection. The expected result is Negative. Fact Sheet for Patients:  StrictlyIdeas.no Fact Sheet for Healthcare Providers: BankingDealers.co.za This test is not yet approved or cleared by the [place] FDA and has been authorized for detection and/or diagnosis of SARS-CoV-2 by FDA under an Emergency Use Authorization (EUA).  This EUA will remain in effect (meaning this test can be used) for the duration of the COVID-19 declaration under Section 564(b)(1) of the Act, 21 U.S.C. section 360bbb-3(b)(1), unless the authorization is terminated or revoked sooner. Performed at [facility], [place] [address] [place], [place] [address]   MRSA PCR Screening     Status: None   Collection Time: 06/11/19 12:30 PM   Specimen: Nasopharyngeal  Result Value  Ref Range Status   MRSA by PCR NEGATIVE NEGATIVE Final    Comment:        The GeneXpert MRSA Assay (FDA approved for NASAL specimens only), is one component of a comprehensive MRSA colonization surveillance program. It is not intended to diagnose MRSA infection nor to guide or monitor treatment for MRSA infections. Performed at [facility], [address] [place], [place] [address]     Procedures and diagnostic studies:  Dg Abd 1 View  Result Date: 06/12/2019 CLINICAL DATA:  Tube placement. EXAM: ABDOMEN - 1 VIEW COMPARISON:  None. FINDINGS: An enteric tube is present in the left upper quadrant in the stomach. Paucity of gas throughout the abdomen with nonspecific bowel gas pattern, no clear signs of obstruction. Spinal degenerative changes without acute bone finding. IMPRESSION: An enteric tube is present in the left upper quadrant in the stomach. Paucity of gas throughout the abdomen. Electronically Signed   By: Zetta Bills M.D.   On: 06/12/2019 15:10   US Renal  Result Date: 06/10/2019 CLINICAL DATA:  Renal failure EXAM: RENAL / URINARY TRACT ULTRASOUND COMPLETE COMPARISON:  None at FINDINGS: Right Kidney: Renal measurements: 13.2 x 7.4 x 6.4 cm = volume: 325 mL. Normal cortical thickness. Increased cortical echogenicity. No mass, hydronephrosis, or shadowing calcification. Left Kidney: Renal measurements: 14.4 x 5.9 x 5.6 cm = volume: 248 mL. Normal cortical thickness. Increased cortical echogenicity. Exophytic nodule from lateral upper to mid LEFT kidney 2.2 x 1.7 x 1.8 cm, containing low level internal echoes, question solid versus complex cystic. No additional mass, hydronephrosis or shadowing calcification. Bladder: Appears normal for degree of bladder distention. Other: N/A IMPRESSION: Medical renal disease changes of both kidneys. Exophytic indeterminate nodule lateral aspect of upper to mid LEFT kidney 2.2 cm greatest size; this could represent a solid or a complex  cystic lesion and further characterization by MR imaging is recommended. Electronically Signed   By: Lavonia Dana M.D.   On: 06/10/2019 23:43   Dg Chest Port 1 View  Result Date: 06/12/2019 CLINICAL DATA:  Central line, gastric tube, intubation EXAM: PORTABLE CHEST 1 VIEW COMPARISON:  06/12/2019, 01:13 p.m. FINDINGS: Interval placement of endotracheal tube, tip below the thoracic inlet over the mid trachea. Esophagogastric tube with tip and side port below the diaphragm. Right neck vascular catheter, tip projecting over the right atrium. Unchanged cardiomegaly. No acute abnormality of the lungs. IMPRESSION: 1. Interval placement of endotracheal tube, tip below the thoracic inlet over the mid  trachea. 2.  Esophagogastric tube with tip and side port below the diaphragm. 3. Right neck vascular catheter, tip projecting over the right atrium. 4.  Unchanged cardiomegaly. No acute abnormality of the lungs. Electronically Signed   By: Eddie Candle M.D.   On: 06/12/2019 15:10   Dg Chest Port 1 View  Result Date: 06/12/2019 CLINICAL DATA:  Pulmonary disease. EXAM: PORTABLE CHEST 1 VIEW COMPARISON:  09/15/2017. FINDINGS: Mediastinum hilar structures are normal. Cardiomegaly. No pulmonary venous congestion. Low lung volumes with mild right base atelectasis. No pleural effusion or pneumothorax. No acute bony abnormality IMPRESSION: Stable cardiomegaly. No pulmonary venous congestion. Low lung volumes with mild right base atelectasis. Electronically Signed   By: Marcello Moores  Register   On: 06/12/2019 13:27   US Abdomen Limited Ruq  Result Date: 06/10/2019 CLINICAL DATA:  Abdominal pain.  Elevated lipase. EXAM: ULTRASOUND ABDOMEN LIMITED RIGHT UPPER QUADRANT COMPARISON:  None. FINDINGS: Gallbladder: No gallstones or wall thickening visualized. No sonographic Murphy sign noted by sonographer. Common bile duct: Diameter: 5 mm, within normal limits. Liver: No focal lesion identified. Within normal limits in parenchymal  echogenicity. Portal vein is patent on color Doppler imaging with normal direction of blood flow towards the liver. Other: Increased echogenicity of right renal parenchyma noted, consistent with medical renal disease. IMPRESSION: No evidence of gallstones or other hepatobiliary abnormality. Increased parenchymal echogenicity of right kidney, consistent with medical renal disease. Electronically Signed   By: Marlaine Hind M.D.   On: 06/10/2019 21:45    Medications:    amLODipine  10 mg Oral Daily   Chlorhexidine Gluconate Cloth  6 each Topical Daily   folic acid  1 mg Oral Daily   heparin  5,000 Units Subcutaneous Q8H   hydrALAZINE  25 mg Oral Q8H   multivitamin with minerals  1 tablet Oral Daily   sodium chloride flush  3 mL Intravenous Once   sodium chloride flush  3 mL Intravenous Q12H   thiamine  100 mg Oral Daily   Or   thiamine  100 mg Intravenous Daily   Continuous Infusions:   prismasol BGK 4/2.5      prismasol BGK 4/2.5     azithromycin 250 mL/hr at 06/12/19 1614   cefTRIAXone (ROCEPHIN)  IV     prismasol BGK 4/2.5     propofol (DIPRIVAN) infusion 45 mcg/kg/min (06/12/19 1614)   sodium bicarbonate 150 mEq in dextrose 5% 1000 mL Stopped (06/12/19 1528)     LOS: 2 days   Ashauna Bertholf  [facility] Pager [phone].   *Please refer to [email or web address], password [email or web address] to get updated schedule on who will round on this patient, as hospitalists switch teams weekly. If 7PM-7AM, please contact night-coverage at [email or web address], password [email or web address] for any overnight needs.  06/12/2019, 4:27 PM

## 2019-06-13 LAB — RENAL FUNCTION PANEL
Albumin: 2.5 g/dL — ABNORMAL LOW (ref 3.5–5.0)
Albumin: 2.7 g/dL — ABNORMAL LOW (ref 3.5–5.0)
Anion gap: 16 — ABNORMAL HIGH (ref 5–15)
Anion gap: 12 (ref 5–15)
BUN: 147 mg/dL — ABNORMAL HIGH (ref 6–20)
BUN: 104 mg/dL — ABNORMAL HIGH (ref 6–20)
CO2: 28 mmol/L (ref 22–32)
CO2: 28 mmol/L (ref 22–32)
Calcium: 6 mg/dL — CL (ref 8.9–10.3)
Calcium: 6.4 mg/dL — CL (ref 8.9–10.3)
Chloride: 93 mmol/L — ABNORMAL LOW (ref 98–111)
Chloride: 97 mmol/L — ABNORMAL LOW (ref 98–111)
Creatinine, Ser: 8.56 mg/dL — ABNORMAL HIGH (ref 0.61–1.24)
Creatinine, Ser: 5.97 mg/dL — ABNORMAL HIGH (ref 0.61–1.24)
GFR calc Af Amer: 7 mL/min — ABNORMAL LOW (ref >60)
GFR calc Af Amer: 11 mL/min — ABNORMAL LOW (ref >60)
GFR calc non Af Amer: 6 mL/min — ABNORMAL LOW (ref >60)
GFR calc non Af Amer: 10 mL/min — ABNORMAL LOW (ref >60)
Glucose, Bld: 142 mg/dL — ABNORMAL HIGH (ref 70–99)
Glucose, Bld: 123 mg/dL — ABNORMAL HIGH (ref 70–99)
Phosphorus: 6.5 mg/dL — ABNORMAL HIGH (ref 2.5–4.6)
Phosphorus: 4.5 mg/dL (ref 2.5–4.6)
Potassium: 3.3 mmol/L — ABNORMAL LOW (ref 3.5–5.1)
Potassium: 3.8 mmol/L (ref 3.5–5.1)
Sodium: 137 mmol/L (ref 135–145)
Sodium: 137 mmol/L (ref 135–145)

## 2019-06-13 LAB — GLUCOSE, CAPILLARY
Glucose-Capillary: 114 mg/dL — ABNORMAL HIGH (ref 70–99)
Glucose-Capillary: 117 mg/dL — ABNORMAL HIGH (ref 70–99)
Glucose-Capillary: 118 mg/dL — ABNORMAL HIGH (ref 70–99)
Glucose-Capillary: 130 mg/dL — ABNORMAL HIGH (ref 70–99)
Glucose-Capillary: 131 mg/dL — ABNORMAL HIGH (ref 70–99)
Glucose-Capillary: 131 mg/dL — ABNORMAL HIGH (ref 70–99)
Glucose-Capillary: 143 mg/dL — ABNORMAL HIGH (ref 70–99)

## 2019-06-13 LAB — HEPATIC FUNCTION PANEL
ALT: 99 U/L — ABNORMAL HIGH (ref 0–44)
ALT: 93 U/L — ABNORMAL HIGH (ref 0–44)
AST: 85 U/L — ABNORMAL HIGH (ref 15–41)
AST: 81 U/L — ABNORMAL HIGH (ref 15–41)
Albumin: 2.5 g/dL — ABNORMAL LOW (ref 3.5–5.0)
Albumin: 2.8 g/dL — ABNORMAL LOW (ref 3.5–5.0)
Alkaline Phosphatase: 55 U/L (ref 38–126)
Alkaline Phosphatase: 59 U/L (ref 38–126)
Bilirubin, Direct: 0.2 mg/dL (ref 0.0–0.2)
Bilirubin, Direct: 0.4 mg/dL — ABNORMAL HIGH (ref 0.0–0.2)
Indirect Bilirubin: 0.5 mg/dL (ref 0.3–0.9)
Indirect Bilirubin: 0.5 mg/dL (ref 0.3–0.9)
Total Bilirubin: 0.7 mg/dL (ref 0.3–1.2)
Total Bilirubin: 0.9 mg/dL (ref 0.3–1.2)
Total Protein: 6 g/dL — ABNORMAL LOW (ref 6.5–8.1)
Total Protein: 6.4 g/dL — ABNORMAL LOW (ref 6.5–8.1)

## 2019-06-13 LAB — CBC WITH DIFFERENTIAL/PLATELET
Abs Immature Granulocytes: 0.07 10*3/uL (ref 0.00–0.07)
Basophils Absolute: 0 10*3/uL (ref 0.0–0.1)
Basophils Relative: 0 %
Eosinophils Absolute: 0.1 10*3/uL (ref 0.0–0.5)
Eosinophils Relative: 3 %
HCT: 32.2 % — ABNORMAL LOW (ref 39.0–52.0)
Hemoglobin: 11.3 g/dL — ABNORMAL LOW (ref 13.0–17.0)
Immature Granulocytes: 2 %
Lymphocytes Relative: 22 %
Lymphs Abs: 1 10*3/uL (ref 0.7–4.0)
MCH: 28.8 pg (ref 26.0–34.0)
MCHC: 35.1 g/dL (ref 30.0–36.0)
MCV: 81.9 fL (ref 80.0–100.0)
Monocytes Absolute: 0.6 10*3/uL (ref 0.1–1.0)
Monocytes Relative: 13 %
Neutro Abs: 2.7 10*3/uL (ref 1.7–7.7)
Neutrophils Relative %: 60 %
Platelets: 157 10*3/uL (ref 150–400)
RBC: 3.93 MIL/uL — ABNORMAL LOW (ref 4.22–5.81)
RDW: 12 % (ref 11.5–15.5)
WBC: 4.5 10*3/uL (ref 4.0–10.5)
nRBC: 0 % (ref 0.0–0.2)

## 2019-06-13 LAB — HEMOGLOBIN A1C
Hgb A1c MFr Bld: 6.3 % — ABNORMAL HIGH (ref 4.8–5.6)
Mean Plasma Glucose: 134.11 mg/dL

## 2019-06-13 LAB — BLOOD GAS, ARTERIAL
Acid-Base Excess: 8.2 mmol/L — ABNORMAL HIGH (ref 0.0–2.0)
Bicarbonate: 32.8 mmol/L — ABNORMAL HIGH (ref 20.0–28.0)
FIO2: 0.21
MECHVT: 500 mL
O2 Saturation: 97.7 %
PEEP: 5 cmH2O
Patient temperature: 37
RATE: 16 resp/min
pCO2 arterial: 44 mmHg (ref 32.0–48.0)
pH, Arterial: 7.48 — ABNORMAL HIGH (ref 7.350–7.450)
pO2, Arterial: 92 mmHg (ref 83.0–108.0)

## 2019-06-13 LAB — CK: Total CK: 10566 U/L — ABNORMAL HIGH (ref 49–397)

## 2019-06-13 LAB — MAGNESIUM: Magnesium: 2.4 mg/dL (ref 1.7–2.4)

## 2019-06-13 LAB — PROCALCITONIN: Procalcitonin: 1.01 ng/mL

## 2019-06-13 MED ORDER — CALCIUM GLUCONATE-NACL 1-0.675 GM/50ML-% IV SOLN
1.0000 g | Freq: Once | INTRAVENOUS | Status: AC
  Administered 2019-06-13: 1000 mg via INTRAVENOUS
  Filled 2019-06-13: qty 50

## 2019-06-13 MED ORDER — POTASSIUM CHLORIDE 10 MEQ/100ML IV SOLN
10.0000 meq | INTRAVENOUS | Status: AC
  Administered 2019-06-13 (×4): 10 meq via INTRAVENOUS
  Filled 2019-06-13 (×4): qty 100

## 2019-06-13 MED ORDER — STERILE WATER FOR INJECTION IJ SOLN
INTRAMUSCULAR | Status: AC
  Administered 2019-06-13: 10 mL
  Filled 2019-06-13: qty 10

## 2019-06-13 MED ORDER — ALTEPLASE 2 MG IJ SOLR
2.0000 mg | INTRAMUSCULAR | Status: DC | PRN
  Administered 2019-06-13: 2 mg
  Filled 2019-06-13: qty 2

## 2019-06-13 MED ORDER — IPRATROPIUM-ALBUTEROL 0.5-2.5 (3) MG/3ML IN SOLN: 3.0000 mL | RESPIRATORY_TRACT | Status: DC | PRN

## 2019-06-13 MED ORDER — ADULT MULTIVITAMIN LIQUID CH
15.0000 mL | Freq: Every day | ORAL | Status: DC
  Administered 2019-06-13 – 2019-06-14 (×2): 15 mL
  Filled 2019-06-13 (×2): qty 15

## 2019-06-13 MED ORDER — AZITHROMYCIN 200 MG/5ML PO SUSR
500.0000 mg | Freq: Every day | ORAL | Status: DC
  Administered 2019-06-13 – 2019-06-15 (×3): 500 mg
  Filled 2019-06-13 (×3): qty 12.5

## 2019-06-13 NOTE — Progress Notes (Addendum)
CRITICAL CARE NOTE  CC  follow up respiratory failure and AKI  SUBJECTIVE Patient remains critically ill Prognosis is guarded Pt remains intubated at FiO2 21% CRRT started last night, and restarted this AM due to complications with machine    BP 119/74   Pulse 80   Temp 97.9 F (36.6 C) (Oral)   Resp 17   Ht 6\' 4"  (1.93 m)   Wt 96 kg   SpO2 98%   BMI 25.76 kg/m    I/O last 3 completed shifts: In: 5171.9 [I.V.:4672.4; IV Piggyback:499.4] Out: 1816.6 [Urine:1570; Other:246.6] Total I/O In: [id number] [I.V.:1116.4; IV Piggyback:328.6] Out: 2023 [Urine:1205; Other:818]  SpO2: 98 % FiO2 (%): 21 %   SIGNIFICANT EVENTS 11/3: Intubated, central line placed in IJ. CRRT started  11/4: Remains intubated at FiO2 of 21%. Pt received calcium replacement and is on sodium bicarbonate infusion. Pt is sedated with propofol. Also started on rocephin and azithromycin for abnormal lung sounds and elevated procalcitonin level.   REVIEW OF SYSTEMS  PATIENT IS UNABLE TO PROVIDE COMPLETE REVIEW OF SYSTEMS DUE TO SEVERE CRITICAL ILLNESS   PHYSICAL EXAMINATION:  GENERAL:critically ill appearing, sedated on propofol  HEAD: Normocephalic, atraumatic.  EYES: Pupils equal, round, reactive to light.  No scleral icterus.  MOUTH: Endotracheal tube and OG tube in place.  NECK: Supple.  PULMONARY: +rhonchi bilaterally  CARDIOVASCULAR: S1 and S2. Regular rate and rhythm. No murmurs, rubs, or gallops.  GASTROINTESTINAL: Soft, nontender, -distended. No masses. Positive bowel sounds. No hepatosplenomegaly.  MUSCULOSKELETAL: No swelling, clubbing, or edema.  NEUROLOGIC: obtunded, GCS<8 SKIN:intact,warm,dry  MEDICATIONS: I have reviewed all medications and confirmed regimen as documented   CULTURE RESULTS   Recent Results (from the past 240 hour(s))  SARS Coronavirus 2 by RT PCR (hospital order, performed in [place] hospital lab)     Status: None   Collection Time: 06/10/19 10:02 PM  Result  Value Ref Range Status   SARS Coronavirus 2 NEGATIVE NEGATIVE Final    Comment: (NOTE) If result is NEGATIVE SARS-CoV-2 target nucleic acids are NOT DETECTED. The SARS-CoV-2 RNA is generally detectable in upper and lower  respiratory specimens during the acute phase of infection. The lowest  concentration of SARS-CoV-2 viral copies this assay can detect is 250  copies / mL. A negative result does not preclude SARS-CoV-2 infection  and should not be used as the sole basis for treatment or other  patient management decisions.  A negative result may occur with  improper specimen collection / handling, submission of specimen other  than nasopharyngeal swab, presence of viral mutation(s) within the  areas targeted by this assay, and inadequate number of viral copies  (<250 copies / mL). A negative result must be combined with clinical  observations, patient history, and epidemiological information. If result is POSITIVE SARS-CoV-2 target nucleic acids are DETECTED. The SARS-CoV-2 RNA is generally detectable in upper and lower  respiratory specimens dur ing the acute phase of infection.  Positive  results are indicative of active infection with SARS-CoV-2.  Clinical  correlation with patient history and other diagnostic information is  necessary to determine patient infection status.  Positive results do  not rule out bacterial infection or co-infection with other viruses. If result is PRESUMPTIVE POSTIVE SARS-CoV-2 nucleic acids MAY BE PRESENT.   A presumptive positive result was obtained on the submitted specimen  and confirmed on repeat testing.  While 2019 novel coronavirus  (SARS-CoV-2) nucleic acids may be present in the submitted sample  additional confirmatory testing may  be necessary for epidemiological  and / or clinical management purposes  to differentiate between  SARS-CoV-2 and other Sarbecovirus currently known to infect humans.  If clinically indicated additional testing  with an alternate test  methodology [phone]) is advised. The SARS-CoV-2 RNA is generally  detectable in upper and lower respiratory sp ecimens during the acute  phase of infection. The expected result is Negative. Fact Sheet for Patients:  StrictlyIdeas.no Fact Sheet for Healthcare Providers: BankingDealers.co.za This test is not yet approved or cleared by the [place] FDA and has been authorized for detection and/or diagnosis of SARS-CoV-2 by FDA under an Emergency Use Authorization (EUA).  This EUA will remain in effect (meaning this test can be used) for the duration of the COVID-19 declaration under Section 564(b)(1) of the Act, 21 U.S.C. section 360bbb-3(b)(1), unless the authorization is terminated or revoked sooner. Performed at [facility], [place] [address] [place], [place] [address]   MRSA PCR Screening     Status: None   Collection Time: 06/11/19 12:30 PM   Specimen: Nasopharyngeal  Result Value Ref Range Status   MRSA by PCR NEGATIVE NEGATIVE Final    Comment:        The GeneXpert MRSA Assay (FDA approved for NASAL specimens only), is one component of a comprehensive MRSA colonization surveillance program. It is not intended to diagnose MRSA infection nor to guide or monitor treatment for MRSA infections. Performed at [facility], [address] [place], [place] [address]   CULTURE, BLOOD (ROUTINE X 2) w Reflex to ID Panel     Status: None (Preliminary result)   Collection Time: 06/12/19  1:34 PM   Specimen: BLOOD  Result Value Ref Range Status   Specimen Description BLOOD BLOOD RIGHT HAND  Final   Special Requests   Final    BOTTLES DRAWN AEROBIC AND ANAEROBIC Blood Culture adequate volume   Culture   Final    NO GROWTH < 24 HOURS Performed at [facility], [address], [place], [place] [address]    Report Status PENDING  Incomplete  CULTURE, BLOOD (ROUTINE X 2) w Reflex to  ID Panel     Status: None (Preliminary result)   Collection Time: 06/12/19  1:35 PM   Specimen: BLOOD  Result Value Ref Range Status   Specimen Description BLOOD BLOOD LEFT FOREARM  Final   Special Requests   Final    BOTTLES DRAWN AEROBIC AND ANAEROBIC Blood Culture adequate volume   Culture   Final    NO GROWTH < 24 HOURS Performed at [facility], [address] [place], [place] [address]    Report Status PENDING  Incomplete  Culture, respiratory (non-expectorated)     Status: None (Preliminary result)   Collection Time: 06/13/19 12:10 AM   Specimen: Tracheal Aspirate; Respiratory  Result Value Ref Range Status   Specimen Description   Final    TRACHEAL ASPIRATE Performed at [facility], [address] [place], [place] [address]    Special Requests   Final    NONE Performed at [facility], [address] [place], [place] [address]    Gram Stain   Final    RARE WBC PRESENT, PREDOMINANTLY PMN MODERATE GRAM POSITIVE COCCI IN PAIRS IN CLUSTERS FEW GRAM NEGATIVE RODS Performed at [facility], [place] [address], [place], [place] [address]    Culture PENDING  Incomplete   Report Status PENDING  Incomplete  IMAGING    Dg Abd 1 View  Result Date: 06/12/2019 CLINICAL DATA:  Tube placement. EXAM: ABDOMEN - 1 VIEW COMPARISON:  None. FINDINGS: An enteric tube is present in the left upper quadrant in the stomach. Paucity of gas throughout the abdomen with nonspecific bowel gas pattern, no clear signs of obstruction. Spinal degenerative changes without acute bone finding. IMPRESSION: An enteric tube is present in the left upper quadrant in the stomach. Paucity of gas throughout the abdomen. Electronically Signed   By: Zetta Bills M.D.   On: 06/12/2019 15:10   Dg Chest Port 1 View  Result Date: 06/12/2019 CLINICAL DATA:  Central line, gastric tube, intubation EXAM: PORTABLE CHEST 1 VIEW COMPARISON:  06/12/2019, 01:13 p.m. FINDINGS:  Interval placement of endotracheal tube, tip below the thoracic inlet over the mid trachea. Esophagogastric tube with tip and side port below the diaphragm. Right neck vascular catheter, tip projecting over the right atrium. Unchanged cardiomegaly. No acute abnormality of the lungs. IMPRESSION: 1. Interval placement of endotracheal tube, tip below the thoracic inlet over the mid trachea. 2.  Esophagogastric tube with tip and side port below the diaphragm. 3. Right neck vascular catheter, tip projecting over the right atrium. 4.  Unchanged cardiomegaly. No acute abnormality of the lungs. Electronically Signed   By: Eddie Candle M.D.   On: 06/12/2019 15:10       Indwelling Urinary Catheter continued, requirement due to   Reason to continue Indwelling Urinary Catheter strict Intake/Output monitoring for hemodynamic instability   Central Line/ continued, requirement due to  Reason to continue [place] of central venous pressure or other hemodynamic parameters and poor IV access   Ventilator continued, requirement due to severe respiratory failure   Ventilator Sedation RASS 0 to -2      ASSESSMENT AND PLAN -Multidisciplinary rounds held today  Altered mental status with lethargy -Likely due to metabolic encephalopathy with severe uremia -Concern for loss of airway protection -High risk for intubation -Empirically on Rocephin and Zithromax due to abnormal lung auscultation   Renal Failure-stage IV -Likely due to rhabdomyolysis -Nephrology on case-appreciate input -Plan for dialysis catheter placement and CRRT -Severe uremia high risk for bleeding-DDAVP-appreciate pharmacologist collaboration -follow chem 7 -follow UO -continue Foley Catheter-assess need daily   Rhabdomyolysis -Unable to infuse aggressive resuscitation due to volume overload with renal failure -Nephrology on case-appreciate input -Multiple electrolyte abnormalities-plan for  CRRT     ID -continue IV abx as prescibed -follow up cultures  GI/Nutrition GI PROPHYLAXIS as indicated DIET-->TF's as tolerated Constipation protocol as indicated  ENDO - ICU hypoglycemic\Hyperglycemia protocol -check FSBS per protocol   ELECTROLYTES -follow labs as needed -replace as needed -pharmacy consultation   DVT/GI PRX ordered -SCDs  TRANSFUSIONS AS NEEDED MONITOR FSBS ASSESS the need for LABS as needed   Critical care provider statement:   Critical care time (minutes): 32  Critical care time was exclusive of: Separately billable procedures and treating other patients  Critical care was necessary to treat or prevent imminent or life-threatening deterioration of the following conditions:  Altered mental status with lethargy, metabolic encephalopathy, rhabdomyolysis, stage IV acute kidney failure, multiple comorbid conditions  Critical care was time spent personally by me on the following activities: Development of treatment plan with patient or surrogate, discussions with consultants, evaluation of patient's response to treatment, examination of patient, obtaining history from patient or surrogate, ordering and performing treatments and interventions, ordering and review of laboratory studies and re-evaluation of patient's condition.  I assumed  direction of critical care for this patient from another provider in my specialty: no   This document was prepared using Dragon voice recognition software and may include unintentional dictation errors.    Ottie Glazier, M.D.  Division of [place]

## 2019-06-13 NOTE — Progress Notes (Signed)
[facility]  ROUNDING NOTE   Subjective:  CRRT was started however we did have issues with catheter clotting. Cathflo was instilled. CRRT to be restarted later this a.m. Patient had to be intubated in order to safely perform dialysis as he is pulled out multiple lines.  Objective:  Vital signs in last 24 hours:  Temp:  [97.6 F (36.4 C)-98.9 F (37.2 C)] 97.7 F (36.5 C) (11/04 0730) Pulse Rate:  [84-104] 86 (11/04 0800) Resp:  [14-20] 17 (11/04 0800) BP: (123-203)/(76-95) 145/86 (11/04 0800) SpO2:  [94 %-98 %] 98 % (11/04 0800) FiO2 (%):  [21 %-30 %] 21 % (11/04 0757) Weight:  [96 kg] 96 kg (11/04 0500)  Weight change: 3.7 kg Filed Weights   06/11/19 1208 06/12/19 0500 06/13/19 0500  Weight: 92.3 kg 95.6 kg 96 kg    Intake/Output: I/O last 3 completed shifts: In: 5171.9 [I.V.:4672.4; IV Piggyback:499.4] Out: 1816.6 [Urine:1570; Other:246.6]   Intake/Output this shift:  Total I/O In: 37.3 [I.V.:37.3] Out: 900 [Urine:900]  Physical Exam: General: Critically ill-appearing  Head: 1Endotracheal tube in place  Eyes: Anicteric  Neck: Supple, trachea midline  Lungs:  Clear to auscultation, on ventilator.  Heart: S1S2 no obvious pericardial friction rub.  Abdomen:  Soft, nontender, bowel sounds present  Extremities: No peripheral edema.  Neurologic: Intubated, sedated  Skin: No lesions  GU: Foley catheter in place    Basic Metabolic Panel: Recent Labs  Lab 06/10/19 1907 06/11/19 0421 06/12/19 0508 06/12/19 2226 06/13/19 0443  NA 130* 133* 134* 135 137  K 4.4 5.0 3.9 3.6 3.3*  CL 90* 93* 90* 91* 93*  CO2 17* 15* 21* 26 28  GLUCOSE 130* 121* 156* 161* 142*  BUN 188* 172* 193* 163* 147*  CREATININE 11.56* 11.84* 11.89* 9.94* 8.56*  CALCIUM 6.4* 6.0* 5.3* 5.8* 6.0*  MG  --   --  2.5*  --  2.4  PHOS  --  11.3* 9.0* 7.0* 6.5*    Liver Function Tests: Recent Labs  Lab 06/10/19 1907 06/11/19 0421 06/12/19 0508 06/12/19 2226 06/13/19 0443   AST 450* 327* 156*  --  85*  ALT 237* 208* 141*  --  99*  ALKPHOS 77 71 64  --  55  BILITOT 1.0 1.4* 1.1  --  0.7  PROT 7.9 7.3 6.4*  --  6.0*  ALBUMIN 3.5 3.4*  3.3* 2.9* 2.7* 2.5*  2.5*   Recent Labs  Lab 06/10/19 1907  LIPASE 339*   Recent Labs  Lab 06/10/19 2058  AMMONIA 22    CBC: Recent Labs  Lab 06/10/19 1907 06/11/19 0421 06/12/19 0508 06/13/19 0443  WBC 9.3 8.4 5.6 4.5  NEUTROABS  --   --  4.0 2.7  HGB 15.2 14.2 12.6* 11.3*  HCT 44.6 40.9 35.3* 32.2*  MCV 83.4 82.6 80.6 81.9  PLT 201 186 163 157    Cardiac Enzymes: Recent Labs  Lab 06/11/19 1451 06/13/19 0443  CKTOTAL 23,960* 10,566*    BNP: Invalid input(s): POCBNP  CBG: Recent Labs  Lab 06/11/19 1200 06/12/19 2030 06/13/19 0026 06/13/19 0414  GLUCAP 81 152* 130* 143*    Microbiology: Results for orders placed or performed during the hospital encounter of 06/10/19  SARS Coronavirus 2 by RT PCR (hospital order, performed in [place] hospital lab)     Status: None   Collection Time: 06/10/19 10:02 PM  Result Value Ref Range Status   SARS Coronavirus 2 NEGATIVE NEGATIVE Final    Comment: (NOTE) If result is  NEGATIVE SARS-CoV-2 target nucleic acids are NOT DETECTED. The SARS-CoV-2 RNA is generally detectable in upper and lower  respiratory specimens during the acute phase of infection. The lowest  concentration of SARS-CoV-2 viral copies this assay can detect is 250  copies / mL. A negative result does not preclude SARS-CoV-2 infection  and should not be used as the sole basis for treatment or other  patient management decisions.  A negative result may occur with  improper specimen collection / handling, submission of specimen other  than nasopharyngeal swab, presence of viral mutation(s) within the  areas targeted by this assay, and inadequate number of viral copies  (<250 copies / mL). A negative result must be combined with clinical  observations, patient history, and  epidemiological information. If result is POSITIVE SARS-CoV-2 target nucleic acids are DETECTED. The SARS-CoV-2 RNA is generally detectable in upper and lower  respiratory specimens dur ing the acute phase of infection.  Positive  results are indicative of active infection with SARS-CoV-2.  Clinical  correlation with patient history and other diagnostic information is  necessary to determine patient infection status.  Positive results do  not rule out bacterial infection or co-infection with other viruses. If result is PRESUMPTIVE POSTIVE SARS-CoV-2 nucleic acids MAY BE PRESENT.   A presumptive positive result was obtained on the submitted specimen  and confirmed on repeat testing.  While 2019 novel coronavirus  (SARS-CoV-2) nucleic acids may be present in the submitted sample  additional confirmatory testing may be necessary for epidemiological  and / or clinical management purposes  to differentiate between  SARS-CoV-2 and other Sarbecovirus currently known to infect humans.  If clinically indicated additional testing with an alternate test  methodology [phone]) is advised. The SARS-CoV-2 RNA is generally  detectable in upper and lower respiratory sp ecimens during the acute  phase of infection. The expected result is Negative. Fact Sheet for Patients:  StrictlyIdeas.no Fact Sheet for Healthcare Providers: BankingDealers.co.za This test is not yet approved or cleared by the [place] FDA and has been authorized for detection and/or diagnosis of SARS-CoV-2 by FDA under an Emergency Use Authorization (EUA).  This EUA will remain in effect (meaning this test can be used) for the duration of the COVID-19 declaration under Section 564(b)(1) of the Act, 21 U.S.C. section 360bbb-3(b)(1), unless the authorization is terminated or revoked sooner. Performed at [facility], [place] [address] [place], [place] [address]   MRSA PCR  Screening     Status: None   Collection Time: 06/11/19 12:30 PM   Specimen: Nasopharyngeal  Result Value Ref Range Status   MRSA by PCR NEGATIVE NEGATIVE Final    Comment:        The GeneXpert MRSA Assay (FDA approved for NASAL specimens only), is one component of a comprehensive MRSA colonization surveillance program. It is not intended to diagnose MRSA infection nor to guide or monitor treatment for MRSA infections. Performed at [facility], [address] [place], [place] [address]   CULTURE, BLOOD (ROUTINE X 2) w Reflex to ID Panel     Status: None (Preliminary result)   Collection Time: 06/12/19  1:34 PM   Specimen: BLOOD  Result Value Ref Range Status   Specimen Description BLOOD BLOOD RIGHT HAND  Final   Special Requests   Final    BOTTLES DRAWN AEROBIC AND ANAEROBIC Blood Culture adequate volume   Culture   Final    NO GROWTH < 24 HOURS Performed at [facility], [address] [place], [place] [address]    Report Status PENDING  Incomplete  CULTURE, BLOOD (ROUTINE X 2) w Reflex to ID Panel     Status: None (Preliminary result)   Collection Time: 06/12/19  1:35 PM   Specimen: BLOOD  Result Value Ref Range Status   Specimen Description BLOOD BLOOD LEFT FOREARM  Final   Special Requests   Final    BOTTLES DRAWN AEROBIC AND ANAEROBIC Blood Culture adequate volume   Culture   Final    NO GROWTH < 24 HOURS Performed at [facility], [address] [place], [place] [address]    Report Status PENDING  Incomplete  Culture, respiratory (non-expectorated)     Status: None (Preliminary result)   Collection Time: 06/13/19 12:10 AM   Specimen: Tracheal Aspirate; Respiratory  Result Value Ref Range Status   Specimen Description   Final    TRACHEAL ASPIRATE Performed at [facility], [address] [place], [place] [address]    Special Requests   Final    NONE Performed at [facility], [address]  [place], [place] [address]    Gram Stain   Final    RARE WBC PRESENT, PREDOMINANTLY PMN MODERATE GRAM POSITIVE COCCI IN PAIRS IN CLUSTERS FEW GRAM NEGATIVE RODS Performed at [facility], [place] [address], [place], [place] [address]    Culture PENDING  Incomplete   Report Status PENDING  Incomplete    Coagulation Studies: Recent Labs    06/10/19 2056/09/21 06/12/19 1138  LABPROT 15.8* 16.3*  INR 1.3* 1.3*    Urinalysis: Recent Labs    06/11/19 0316  COLORURINE YELLOW*  LABSPEC 1.012  PHURINE 5.0  GLUCOSEU NEGATIVE  HGBUR LARGE*  BILIRUBINUR NEGATIVE  KETONESUR NEGATIVE  PROTEINUR 100*  NITRITE NEGATIVE  LEUKOCYTESUR SMALL*      Imaging: Dg Abd 1 View  Result Date: 06/12/2019 CLINICAL DATA:  Tube placement. EXAM: ABDOMEN - 1 VIEW COMPARISON:  None. FINDINGS: An enteric tube is present in the left upper quadrant in the stomach. Paucity of gas throughout the abdomen with nonspecific bowel gas pattern, no clear signs of obstruction. Spinal degenerative changes without acute bone finding. IMPRESSION: An enteric tube is present in the left upper quadrant in the stomach. Paucity of gas throughout the abdomen. Electronically Signed   By: Zetta Bills M.D.   On: 06/12/2019 15:10   Dg Chest Port 1 View  Result Date: 06/12/2019 CLINICAL DATA:  Central line, gastric tube, intubation EXAM: PORTABLE CHEST 1 VIEW COMPARISON:  06/12/2019, 01:13 p.m. FINDINGS: Interval placement of endotracheal tube, tip below the thoracic inlet over the mid trachea. Esophagogastric tube with tip and side port below the diaphragm. Right neck vascular catheter, tip projecting over the right atrium. Unchanged cardiomegaly. No acute abnormality of the lungs. IMPRESSION: 1. Interval placement of endotracheal tube, tip below the thoracic inlet over the mid trachea. 2.  Esophagogastric tube with tip and side port below the diaphragm. 3. Right neck vascular catheter, tip projecting over the right atrium. 4.  Unchanged  cardiomegaly. No acute abnormality of the lungs. Electronically Signed   By: Eddie Candle M.D.   On: 06/12/2019 15:10   Dg Chest Port 1 View  Result Date: 06/12/2019 CLINICAL DATA:  Pulmonary disease. EXAM: PORTABLE CHEST 1 VIEW COMPARISON:  09/15/2017. FINDINGS: Mediastinum hilar structures are normal. Cardiomegaly. No pulmonary venous congestion. Low lung volumes with mild right base atelectasis. No pleural effusion or pneumothorax. No acute bony abnormality IMPRESSION: Stable  cardiomegaly. No pulmonary venous congestion. Low lung volumes with mild right base atelectasis. Electronically Signed   By: Marcello Moores  Register   On: 06/12/2019 13:27     Medications:   .  prismasol BGK 4/2.5 Stopped (06/13/19 0230)  .  prismasol BGK 4/2.5 Stopped (06/13/19 0230)  . sodium chloride 5 mL/hr at 06/13/19 0700  . azithromycin Stopped (06/12/19 1653)  . calcium gluconate    . cefTRIAXone (ROCEPHIN)  IV Stopped (06/12/19 1919)  . potassium chloride 10 mEq (06/13/19 0909)  . prismasol BGK 4/2.5 Stopped (06/13/19 0230)  . propofol (DIPRIVAN) infusion 60 mcg/kg/min (06/13/19 0800)  . sodium bicarbonate 150 mEq in dextrose 5% 1000 mL 150 mEq (06/13/19 0709)   . chlorhexidine gluconate (MEDLINE KIT)  15 mL Mouth Rinse BID  . Chlorhexidine Gluconate Cloth  6 each Topical Daily  . folic acid  1 mg Per Tube Daily  . heparin  5,000 Units Subcutaneous Q8H  . insulin aspart  0-9 Units Subcutaneous Q4H  . mouth rinse  15 mL Mouth Rinse 10 times per day  . multivitamin with minerals  1 tablet Per Tube Daily  . sodium chloride flush  3 mL Intravenous Once  . sodium chloride flush  3 mL Intravenous Q12H  . thiamine  100 mg Per Tube Daily   sodium chloride, alteplase, fentaNYL (SUBLIMAZE) injection, heparin, hydrALAZINE, ipratropium-albuterol, labetalol, LORazepam **OR** LORazepam, ondansetron **OR** ondansetron (ZOFRAN) IV  Assessment/ Plan:  58 y.o. male with a PMHx of alcohol abuse and impaired cognitive  function, who was admitted to [facility] on 06/10/2019 for evaluation of weakness, diarrhea, nausea, and vomiting.    1.  Acute kidney injury suspected secondary to prolonged volume depletion/rhabdomyolysis.  CVVHDF was started yesterday however catheter clotting issues noted.  Cathflo was instilled within the catheter.  Awaiting restart of CRRT later this a.m.  2.  Metabolic acidosis.  Serum bicarbonate now up to 28.  Okay to maintain the patient on bicarbonate drip for now.  3.  Alcohol abuse.  Management as per hospitalist and critical care.  4.  Hypokalemia.  Serum potassium noted to be low at 3.3.  Administer potassium chloride 40 mEq IV x1 today.   LOS: 3 Martin Diaz 11/4/20209:39 AM

## 2019-06-14 LAB — RENAL FUNCTION PANEL
Albumin: 2.6 g/dL — ABNORMAL LOW (ref 3.5–5.0)
Anion gap: 9 (ref 5–15)
BUN: 54 mg/dL — ABNORMAL HIGH (ref 6–20)
CO2: 30 mmol/L (ref 22–32)
Calcium: 7.1 mg/dL — ABNORMAL LOW (ref 8.9–10.3)
Chloride: 99 mmol/L (ref 98–111)
Creatinine, Ser: 3.58 mg/dL — ABNORMAL HIGH (ref 0.61–1.24)
GFR calc Af Amer: 21 mL/min — ABNORMAL LOW (ref >60)
GFR calc non Af Amer: 18 mL/min — ABNORMAL LOW (ref >60)
Glucose, Bld: 122 mg/dL — ABNORMAL HIGH (ref 70–99)
Phosphorus: 3.6 mg/dL (ref 2.5–4.6)
Potassium: 4 mmol/L (ref 3.5–5.1)
Sodium: 138 mmol/L (ref 135–145)

## 2019-06-14 LAB — PROTEIN ELECTRO, RANDOM URINE
Albumin ELP, Urine: 54.6 %
Alpha-1-Globulin, U: 2.9 %
Alpha-2-Globulin, U: 6.3 %
Beta Globulin, U: 13.5 %
Gamma Globulin, U: 22.7 %
Total Protein, Urine: 67.4 mg/dL

## 2019-06-14 LAB — GLUCOSE, CAPILLARY
Glucose-Capillary: 131 mg/dL — ABNORMAL HIGH (ref 70–99)
Glucose-Capillary: 128 mg/dL — ABNORMAL HIGH (ref 70–99)
Glucose-Capillary: 87 mg/dL (ref 70–99)
Glucose-Capillary: 91 mg/dL (ref 70–99)
Glucose-Capillary: 102 mg/dL — ABNORMAL HIGH (ref 70–99)

## 2019-06-14 LAB — CK: Total CK: 5037 U/L — ABNORMAL HIGH (ref 49–397)

## 2019-06-14 LAB — PROCALCITONIN: Procalcitonin: 0.5 ng/mL

## 2019-06-14 LAB — MAGNESIUM: Magnesium: 2.4 mg/dL (ref 1.7–2.4)

## 2019-06-14 MED ORDER — CLONAZEPAM 0.5 MG PO TABS
0.2500 mg | ORAL_TABLET | Freq: Two times a day (BID) | ORAL | Status: DC
  Administered 2019-06-14: 0.25 mg
  Filled 2019-06-14: qty 1

## 2019-06-14 MED ORDER — TRAZODONE HCL 50 MG PO TABS
50.0000 mg | ORAL_TABLET | Freq: Every evening | ORAL | Status: DC | PRN
  Administered 2019-06-14: 50 mg via ORAL
  Filled 2019-06-14 (×2): qty 1

## 2019-06-14 MED ORDER — ADULT MULTIVITAMIN W/MINERALS CH
1.0000 | ORAL_TABLET | Freq: Every day | ORAL | Status: DC
  Administered 2019-06-17 – 2019-06-27 (×9): 1 via ORAL
  Filled 2019-06-14 (×10): qty 1

## 2019-06-14 MED ORDER — TRAZODONE HCL 50 MG PO TABS
50.0000 mg | ORAL_TABLET | Freq: Once | ORAL | Status: AC
  Administered 2019-06-14: 50 mg via ORAL

## 2019-06-14 MED ORDER — VITAMIN B-1 100 MG PO TABS
100.0000 mg | ORAL_TABLET | Freq: Every day | ORAL | Status: DC
  Administered 2019-06-17 – 2019-06-27 (×9): 100 mg via ORAL
  Filled 2019-06-14 (×10): qty 1

## 2019-06-14 MED ORDER — CLONAZEPAM 0.5 MG PO TABS: 0.5000 mg | ORAL_TABLET | Freq: Two times a day (BID) | ORAL | Status: DC

## 2019-06-14 MED ORDER — CLONAZEPAM 0.5 MG PO TABS
0.2500 mg | ORAL_TABLET | Freq: Two times a day (BID) | ORAL | Status: DC
  Administered 2019-06-14: 0.25 mg via ORAL
  Filled 2019-06-14: qty 1

## 2019-06-14 MED ORDER — CLONAZEPAM 0.5 MG PO TABS
0.2500 mg | ORAL_TABLET | Freq: Once | ORAL | Status: AC
  Administered 2019-06-15: 0.25 mg via ORAL
  Filled 2019-06-14: qty 1

## 2019-06-14 MED ORDER — FOLIC ACID 1 MG PO TABS
1.0000 mg | ORAL_TABLET | Freq: Every day | ORAL | Status: DC
  Administered 2019-06-17 – 2019-06-27 (×9): 1 mg via ORAL
  Filled 2019-06-14 (×10): qty 1

## 2019-06-14 MED ORDER — SODIUM BICARBONATE-DEXTROSE 150-5 MEQ/L-% IV SOLN
150.0000 meq | INTRAVENOUS | Status: DC
  Administered 2019-06-14 – 2019-06-15 (×2): 150 meq via INTRAVENOUS
  Filled 2019-06-14 (×2): qty 1000

## 2019-06-14 NOTE — Progress Notes (Signed)
CRITICAL CARE NOTE  CC  follow up respiratory failure and AKI  SUBJECTIVE Patient remains critically ill Prognosis is guarded Pt remains intubated at FiO2 21% CRRT started last night, and restarted this AM due to complications with machine    BP (!) 143/74   Pulse 77   Temp 97.7 F (36.5 C) (Oral)   Resp 14   Ht 6\' 4"  (1.93 m)   Wt 94.5 kg   SpO2 95%   BMI 25.36 kg/m    I/O last 3 completed shifts: In: 6332.3 [I.V.:5685.1; IV Piggyback:647.2] Out: 7280.6 [Urine:1915; Other:5365.6] Total I/O In: 612 [I.V.:612] Out: 325 [Urine:125; Other:200]  SpO2: 95 % FiO2 (%): 21 %   SIGNIFICANT EVENTS 11/3: Intubated, central line placed in IJ. CRRT started  11/4: Remains intubated at FiO2 of 21%. Pt received calcium replacement and is on sodium bicarbonate infusion. Pt is sedated with propofol. Also started on rocephin and azithromycin for abnormal lung sounds and elevated procalcitonin level.  11/5 -pt s/p weaning trial with liberation from MV, will stop CVVH and remove trialysis  REVIEW OF SYSTEMS  PATIENT IS UNABLE TO PROVIDE COMPLETE REVIEW OF SYSTEMS DUE TO SEVERE CRITICAL ILLNESS   PHYSICAL EXAMINATION:  GENERAL:critically ill appearing, sedated on propofol  HEAD: Normocephalic, atraumatic.  EYES: Pupils equal, round, reactive to light.  No scleral icterus.  MOUTH: Endotracheal tube and OG tube in place.  NECK: Supple.  PULMONARY: +rhonchi bilaterally  CARDIOVASCULAR: S1 and S2. Regular rate and rhythm. No murmurs, rubs, or gallops.  GASTROINTESTINAL: Soft, nontender, -distended. No masses. Positive bowel sounds. No hepatosplenomegaly.  MUSCULOSKELETAL: No swelling, clubbing, or edema.  NEUROLOGIC: obtunded, GCS<8 SKIN:intact,warm,dry  MEDICATIONS: I have reviewed all medications and confirmed regimen as documented   CULTURE RESULTS   Recent Results (from the past 240 hour(s))  SARS Coronavirus 2 by RT PCR (hospital order, performed in [place] hospital  lab)     Status: None   Collection Time: 06/10/19 10:02 PM  Result Value Ref Range Status   SARS Coronavirus 2 NEGATIVE NEGATIVE Final    Comment: (NOTE) If result is NEGATIVE SARS-CoV-2 target nucleic acids are NOT DETECTED. The SARS-CoV-2 RNA is generally detectable in upper and lower  respiratory specimens during the acute phase of infection. The lowest  concentration of SARS-CoV-2 viral copies this assay can detect is 250  copies / mL. A negative result does not preclude SARS-CoV-2 infection  and should not be used as the sole basis for treatment or other  patient management decisions.  A negative result may occur with  improper specimen collection / handling, submission of specimen other  than nasopharyngeal swab, presence of viral mutation(s) within the  areas targeted by this assay, and inadequate number of viral copies  (<250 copies / mL). A negative result must be combined with clinical  observations, patient history, and epidemiological information. If result is POSITIVE SARS-CoV-2 target nucleic acids are DETECTED. The SARS-CoV-2 RNA is generally detectable in upper and lower  respiratory specimens dur ing the acute phase of infection.  Positive  results are indicative of active infection with SARS-CoV-2.  Clinical  correlation with patient history and other diagnostic information is  necessary to determine patient infection status.  Positive results do  not rule out bacterial infection or co-infection with other viruses. If result is PRESUMPTIVE POSTIVE SARS-CoV-2 nucleic acids MAY BE PRESENT.   A presumptive positive result was obtained on the submitted specimen  and confirmed on repeat testing.  While 2019 novel coronavirus  (SARS-CoV-2)  nucleic acids may be present in the submitted sample  additional confirmatory testing may be necessary for epidemiological  and / or clinical management purposes  to differentiate between  SARS-CoV-2 and other Sarbecovirus currently  known to infect humans.  If clinically indicated additional testing with an alternate test  methodology [phone]) is advised. The SARS-CoV-2 RNA is generally  detectable in upper and lower respiratory sp ecimens during the acute  phase of infection. The expected result is Negative. Fact Sheet for Patients:  StrictlyIdeas.no Fact Sheet for Healthcare Providers: BankingDealers.co.za This test is not yet approved or cleared by the [place] FDA and has been authorized for detection and/or diagnosis of SARS-CoV-2 by FDA under an Emergency Use Authorization (EUA).  This EUA will remain in effect (meaning this test can be used) for the duration of the COVID-19 declaration under Section 564(b)(1) of the Act, 21 U.S.C. section 360bbb-3(b)(1), unless the authorization is terminated or revoked sooner. Performed at [facility], [place] [address], [place], [place] [address]   MRSA PCR Screening     Status: None   Collection Time: 06/11/19 12:30 PM   Specimen: Nasopharyngeal  Result Value Ref Range Status   MRSA by PCR NEGATIVE NEGATIVE Final    Comment:        The GeneXpert MRSA Assay (FDA approved for NASAL specimens only), is one component of a comprehensive MRSA colonization surveillance program. It is not intended to diagnose MRSA infection nor to guide or monitor treatment for MRSA infections. Performed at [facility], [address] [place], [place] [address]   CULTURE, BLOOD (ROUTINE X 2) w Reflex to ID Panel     Status: None (Preliminary result)   Collection Time: 06/12/19  1:34 PM   Specimen: BLOOD  Result Value Ref Range Status   Specimen Description BLOOD BLOOD RIGHT HAND  Final   Special Requests   Final    BOTTLES DRAWN AEROBIC AND ANAEROBIC Blood Culture adequate volume   Culture   Final    NO GROWTH 2 DAYS Performed at [facility], [address] [place], [place] [address]    Report Status  PENDING  Incomplete  CULTURE, BLOOD (ROUTINE X 2) w Reflex to ID Panel     Status: None (Preliminary result)   Collection Time: 06/12/19  1:35 PM   Specimen: BLOOD  Result Value Ref Range Status   Specimen Description BLOOD BLOOD LEFT FOREARM  Final   Special Requests   Final    BOTTLES DRAWN AEROBIC AND ANAEROBIC Blood Culture adequate volume   Culture   Final    NO GROWTH 2 DAYS Performed at [facility], [address], [place], [place] [address]    Report Status PENDING  Incomplete  Culture, respiratory (non-expectorated)     Status: None (Preliminary result)   Collection Time: 06/13/19 12:10 AM   Specimen: Tracheal Aspirate; Respiratory  Result Value Ref Range Status   Specimen Description   Final    TRACHEAL ASPIRATE Performed at [facility], [address], [place], [place] [address]    Special Requests   Final    NONE Performed at [facility], [address] [place], [place] [address]    Gram Stain   Final    RARE WBC PRESENT, PREDOMINANTLY PMN MODERATE GRAM POSITIVE COCCI IN PAIRS IN CLUSTERS FEW GRAM NEGATIVE RODS    Culture   Final    CULTURE REINCUBATED FOR BETTER GROWTH Performed at [facility], [address] [place], [place] [address]    Report Status PENDING  Incomplete          IMAGING    No results found.     Indwelling Urinary Catheter continued, requirement due to   Reason to continue Indwelling Urinary Catheter strict Intake/Output monitoring for hemodynamic instability   Central Line/ continued, requirement due to  Reason to continue [place] of central venous pressure or other hemodynamic parameters and poor IV access   Ventilator continued, requirement due to severe respiratory failure   Ventilator Sedation RASS 0 to -2      ASSESSMENT AND PLAN -Multidisciplinary rounds held today  Altered mental status with lethargy -Likely due to metabolic encephalopathy with severe  uremia-s/p extubation will reasess post recovery  -Concern for loss of airway protection -High risk for intubation -Empirically on Rocephin and Zithromax due to abnormal lung auscultation   Renal Failure-stage IV - improved    - making adequate urine now -Likely due to rhabdomyolysis -Nephrology on case-appreciate input -Plan for dialysis d/c today -Severe uremia high risk for bleeding-s/p DDAVP-appreciate pharmacologist collaboration -follow chem 7 -follow UO -continue Foley Catheter-assess need daily   Rhabdomyolysis -Unable to infuse aggressive resuscitation due to volume overload with renal failure -Nephrology on case-appreciate input -Multiple electrolyte abnormalities-improved , will stop CRRT     ID -continue IV abx as prescibed -follow up cultures  GI/Nutrition GI PROPHYLAXIS as indicated DIET-->TF's as tolerated Constipation protocol as indicated  ENDO - ICU hypoglycemic\Hyperglycemia protocol -check FSBS per protocol   ELECTROLYTES -follow labs as needed -replace as needed -pharmacy consultation   DVT/GI PRX ordered -SCDs  TRANSFUSIONS AS NEEDED MONITOR FSBS ASSESS the need for LABS as needed   Critical care provider statement:   Critical care time (minutes): 32  Critical care time was exclusive of: Separately billable procedures and treating other patients  Critical care was necessary to treat or prevent imminent or life-threatening deterioration of the following conditions:  Altered mental status with lethargy, metabolic encephalopathy, rhabdomyolysis, stage IV acute kidney failure, multiple comorbid conditions  Critical care was time spent personally by me on the following activities: Development of treatment plan with patient or surrogate, discussions with consultants, evaluation of patient's response to treatment, examination of patient, obtaining history from patient or surrogate, ordering and performing treatments and  interventions, ordering and review of laboratory studies and re-evaluation of patient's condition.  I assumed direction of critical care for this patient from another provider in my specialty: no   This document was prepared using Dragon voice recognition software and may include unintentional dictation errors.    Ottie Glazier, M.D.  Division of [place]

## 2019-06-14 NOTE — Progress Notes (Signed)
CRRT discontinued per policy and procedure as ordered by Dr. Holley Raring

## 2019-06-14 NOTE — Progress Notes (Signed)
[facility]  ROUNDING NOTE   Subjective:  Renal parameters have significantly improved.  BUN down to 54 with a creatinine of 3.58.  Urine output was 1.6 L over the preceding 24 hours as well.  CRRT stopped this a.m. under our instruction.    Objective:  Vital signs in last 24 hours:  Temp:  [97.7 F (36.5 C)-98.9 F (37.2 C)] 97.7 F (36.5 C) (11/05 0800) Pulse Rate:  [73-92] 73 (11/05 0800) Resp:  [15-19] 16 (11/05 0800) BP: (110-154)/(70-95) 140/78 (11/05 0800) SpO2:  [91 %-100 %] 97 % (11/05 0812) FiO2 (%):  [21 %] 21 % (11/05 0812) Weight:  [94.5 kg] 94.5 kg (11/05 0500)  Weight change: -1.5 kg Filed Weights   06/12/19 0500 06/13/19 0500 06/14/19 0500  Weight: 95.6 kg 96 kg 94.5 kg    Intake/Output: I/O last 3 completed shifts: In: 6332.3 [I.V.:5685.1; IV Piggyback:647.2] Out: 7280.6 [Urine:1915; Other:5365.6]   Intake/Output this shift:  Total I/O In: 425.4 [I.V.:425.4] Out: 250 [Urine:50; Other:200]  Physical Exam: General: Critically ill-appearing  Head: Endotracheal tube in place  Eyes: Anicteric  Neck: Supple, trachea midline  Lungs:  Clear to auscultation, on ventilator.  Heart: S1S2 no obvious pericardial friction rub.  Abdomen:  Soft, nontender, bowel sounds present  Extremities: No peripheral edema.  Neurologic: Intubated, sedated  Skin: No lesions  GU: Foley catheter in place    Basic Metabolic Panel: Recent Labs  Lab 06/12/19 0508 06/12/19 2226 06/13/19 0443 06/13/19 1558 06/14/19 0423  NA 134* 135 137 137 138  K 3.9 3.6 3.3* 3.8 4.0  CL 90* 91* 93* 97* 99  CO2 21* _0 GLUCOSE 156* 161* 142* 123* 122*  BUN 193* 163* 147* 104* 54*  CREATININE 11.89* 9.94* 8.56* 5.97* 3.58*  CALCIUM 5.3* 5.8* 6.0* 6.4* 7.1*  MG 2.5*  --  2.4  --  2.4  PHOS 9.0* 7.0* 6.5* 4.5 3.6    Liver Function Tests: Recent Labs  Lab 06/10/19 1907 06/11/19 0421 06/12/19 0508 06/12/19 2226 06/13/19 0443 06/13/19 1558 06/13/19 1803  06/14/19 0423  AST 450* 327* 156*  --  85*  --  81*  --   ALT 237* 208* 141*  --  99*  --  93*  --   ALKPHOS 77 71 64  --  55  --  59  --   BILITOT 1.0 1.4* 1.1  --  0.7  --  0.9  --   PROT 7.9 7.3 6.4*  --  6.0*  --  6.4*  --   ALBUMIN 3.5 3.4*  3.3* 2.9* 2.7* 2.5*  2.5* 2.7* 2.8* 2.6*   Recent Labs  Lab 06/10/19 1907  LIPASE 339*   Recent Labs  Lab 06/10/19 2058  AMMONIA 22    CBC: Recent Labs  Lab 06/10/19 1907 06/11/19 0421 06/12/19 0508 06/13/19 0443  WBC 9.3 8.4 5.6 4.5  NEUTROABS  --   --  4.0 2.7  HGB 15.2 14.2 12.6* 11.3*  HCT 44.6 40.9 35.3* 32.2*  MCV 83.4 82.6 80.6 81.9  PLT 201 186 163 157    Cardiac Enzymes: Recent Labs  Lab 06/11/19 1451 06/13/19 0443 06/14/19 0423  CKTOTAL 23,960* 10,566* 5,037*    BNP: Invalid input(s): POCBNP  CBG: Recent Labs  Lab 06/13/19 1616 06/13/19 1935 06/13/19 2351 06/14/19 0332 06/14/19 0734  GLUCAP 117* 114* 118* 131* 128*    Microbiology: Results for orders placed or performed during the hospital encounter of 06/10/19  SARS Coronavirus 2 by  RT PCR (hospital order, performed in [place] hospital lab)     Status: None   Collection Time: 06/10/19 10:02 PM  Result Value Ref Range Status   SARS Coronavirus 2 NEGATIVE NEGATIVE Final    Comment: (NOTE) If result is NEGATIVE SARS-CoV-2 target nucleic acids are NOT DETECTED. The SARS-CoV-2 RNA is generally detectable in upper and lower  respiratory specimens during the acute phase of infection. The lowest  concentration of SARS-CoV-2 viral copies this assay can detect is 250  copies / mL. A negative result does not preclude SARS-CoV-2 infection  and should not be used as the sole basis for treatment or other  patient management decisions.  A negative result may occur with  improper specimen collection / handling, submission of specimen other  than nasopharyngeal swab, presence of viral mutation(s) within the  areas targeted by this assay, and  inadequate number of viral copies  (<250 copies / mL). A negative result must be combined with clinical  observations, patient history, and epidemiological information. If result is POSITIVE SARS-CoV-2 target nucleic acids are DETECTED. The SARS-CoV-2 RNA is generally detectable in upper and lower  respiratory specimens dur ing the acute phase of infection.  Positive  results are indicative of active infection with SARS-CoV-2.  Clinical  correlation with patient history and other diagnostic information is  necessary to determine patient infection status.  Positive results do  not rule out bacterial infection or co-infection with other viruses. If result is PRESUMPTIVE POSTIVE SARS-CoV-2 nucleic acids MAY BE PRESENT.   A presumptive positive result was obtained on the submitted specimen  and confirmed on repeat testing.  While 2019 novel coronavirus  (SARS-CoV-2) nucleic acids may be present in the submitted sample  additional confirmatory testing may be necessary for epidemiological  and / or clinical management purposes  to differentiate between  SARS-CoV-2 and other Sarbecovirus currently known to infect humans.  If clinically indicated additional testing with an alternate test  methodology [phone]) is advised. The SARS-CoV-2 RNA is generally  detectable in upper and lower respiratory sp ecimens during the acute  phase of infection. The expected result is Negative. Fact Sheet for Patients:  StrictlyIdeas.no Fact Sheet for Healthcare Providers: BankingDealers.co.za This test is not yet approved or cleared by the [place] FDA and has been authorized for detection and/or diagnosis of SARS-CoV-2 by FDA under an Emergency Use Authorization (EUA).  This EUA will remain in effect (meaning this test can be used) for the duration of the COVID-19 declaration under Section 564(b)(1) of the Act, 21 U.S.C. section 360bbb-3(b)(1), unless the  authorization is terminated or revoked sooner. Performed at [facility], [place] [address] [place], [place] [address]   MRSA PCR Screening     Status: None   Collection Time: 06/11/19 12:30 PM   Specimen: Nasopharyngeal  Result Value Ref Range Status   MRSA by PCR NEGATIVE NEGATIVE Final    Comment:        The GeneXpert MRSA Assay (FDA approved for NASAL specimens only), is one component of a comprehensive MRSA colonization surveillance program. It is not intended to diagnose MRSA infection nor to guide or monitor treatment for MRSA infections. Performed at [facility], [address] [place], [place] [address]   CULTURE, BLOOD (ROUTINE X 2) w Reflex to ID Panel     Status: None (Preliminary result)   Collection Time: 06/12/19  1:34 PM   Specimen: BLOOD  Result Value Ref Range Status   Specimen Description  BLOOD BLOOD RIGHT HAND  Final   Special Requests   Final    BOTTLES DRAWN AEROBIC AND ANAEROBIC Blood Culture adequate volume   Culture   Final    NO GROWTH 2 DAYS Performed at [facility], [address] [place], [place] [address]    Report Status PENDING  Incomplete  CULTURE, BLOOD (ROUTINE X 2) w Reflex to ID Panel     Status: None (Preliminary result)   Collection Time: 06/12/19  1:35 PM   Specimen: BLOOD  Result Value Ref Range Status   Specimen Description BLOOD BLOOD LEFT FOREARM  Final   Special Requests   Final    BOTTLES DRAWN AEROBIC AND ANAEROBIC Blood Culture adequate volume   Culture   Final    NO GROWTH 2 DAYS Performed at [facility], [address], [place], [place] [address]    Report Status PENDING  Incomplete  Culture, respiratory (non-expectorated)     Status: None (Preliminary result)   Collection Time: 06/13/19 12:10 AM   Specimen: Tracheal Aspirate; Respiratory  Result Value Ref Range Status   Specimen Description   Final    TRACHEAL ASPIRATE Performed at [facility], [address],  [place], [place] [address]    Special Requests   Final    NONE Performed at [facility], [address] [place], [place] [address]    Gram Stain   Final    RARE WBC PRESENT, PREDOMINANTLY PMN MODERATE GRAM POSITIVE COCCI IN PAIRS IN CLUSTERS FEW GRAM NEGATIVE RODS    Culture   Final    CULTURE REINCUBATED FOR BETTER GROWTH Performed at [facility], [place] [address], [place], [place] [address]    Report Status PENDING  Incomplete    Coagulation Studies: Recent Labs    06/12/19 1138  LABPROT 16.3*  INR 1.3*    Urinalysis: No results for input(s): COLORURINE, LABSPEC, PHURINE, GLUCOSEU, HGBUR, BILIRUBINUR, KETONESUR, PROTEINUR, UROBILINOGEN, NITRITE, LEUKOCYTESUR in the last 72 hours.  Invalid input(s): APPERANCEUR    Imaging: Dg Abd 1 View  Result Date: 06/12/2019 CLINICAL DATA:  Tube placement. EXAM: ABDOMEN - 1 VIEW COMPARISON:  None. FINDINGS: An enteric tube is present in the left upper quadrant in the stomach. Paucity of gas throughout the abdomen with nonspecific bowel gas pattern, no clear signs of obstruction. Spinal degenerative changes without acute bone finding. IMPRESSION: An enteric tube is present in the left upper quadrant in the stomach. Paucity of gas throughout the abdomen. Electronically Signed   By: Zetta Bills M.D.   On: 06/12/2019 15:10   Dg Chest Port 1 View  Result Date: 06/12/2019 CLINICAL DATA:  Central line, gastric tube, intubation EXAM: PORTABLE CHEST 1 VIEW COMPARISON:  06/12/2019, 01:13 p.m. FINDINGS: Interval placement of endotracheal tube, tip below the thoracic inlet over the mid trachea. Esophagogastric tube with tip and side port below the diaphragm. Right neck vascular catheter, tip projecting over the right atrium. Unchanged cardiomegaly. No acute abnormality of the lungs. IMPRESSION: 1. Interval placement of endotracheal tube, tip below the thoracic inlet over the mid trachea. 2.  Esophagogastric tube with tip and side port  below the diaphragm. 3. Right neck vascular catheter, tip projecting over the right atrium. 4.  Unchanged cardiomegaly. No acute abnormality of the lungs. Electronically Signed   By: Eddie Candle M.D.   On: 06/12/2019 15:10   Dg Chest Port 1 View  Result Date: 06/12/2019 CLINICAL DATA:  Pulmonary disease. EXAM: PORTABLE CHEST 1 VIEW  COMPARISON:  09/15/2017. FINDINGS: Mediastinum hilar structures are normal. Cardiomegaly. No pulmonary venous congestion. Low lung volumes with mild right base atelectasis. No pleural effusion or pneumothorax. No acute bony abnormality IMPRESSION: Stable cardiomegaly. No pulmonary venous congestion. Low lung volumes with mild right base atelectasis. Electronically Signed   By: Marcello Moores  Register   On: 06/12/2019 13:27     Medications:   . sodium chloride 5 mL/hr at 06/14/19 0836  . cefTRIAXone (ROCEPHIN)  IV Stopped (06/13/19 1813)  . propofol (DIPRIVAN) infusion 60 mcg/kg/min (06/14/19 0939)  . sodium bicarbonate 150 mEq in dextrose 5% 1000 mL 150 mEq (06/14/19 0836)   . azithromycin  500 mg Per Tube q1800  . chlorhexidine gluconate (MEDLINE KIT)  15 mL Mouth Rinse BID  . Chlorhexidine Gluconate Cloth  6 each Topical Daily  . clonazePAM  0.25 mg Per Tube BID  . folic acid  1 mg Per Tube Daily  . heparin  5,000 Units Subcutaneous Q8H  . insulin aspart  0-9 Units Subcutaneous Q4H  . mouth rinse  15 mL Mouth Rinse 10 times per day  . multivitamin  15 mL Per Tube Daily  . sodium chloride flush  3 mL Intravenous Once  . sodium chloride flush  3 mL Intravenous Q12H  . thiamine  100 mg Per Tube Daily   sodium chloride, alteplase, fentaNYL (SUBLIMAZE) injection, heparin, hydrALAZINE, ipratropium-albuterol, labetalol, ondansetron **OR** ondansetron (ZOFRAN) IV  Assessment/ Plan:  58 y.o. male with a PMHx of alcohol abuse and impaired cognitive function, who was admitted to [facility] 06/10/2019 for evaluation of weakness, diarrhea, nausea, and vomiting.    1.  Acute  kidney injury suspected secondary to prolonged volume depletion/rhabdomyolysis.  Renal parameters have improved significantly with CRRT as well as hydration.  CK down to 5000.  Good urine output at 1.6 L over the preceding 24 hours.  Therefore we will stop CRRT at this time.  Hold off on intermittent dialysis at the moment.  2.  Metabolic acidosis.  Serum bicarbonate currently 30.  Maintain the patient on sodium bicarbonate drip at 50 cc/h.  3.  Alcohol abuse.  Management as per hospitalist and critical care.  4.  Hypokalemia.  Significantly improved as potassium now up to 4.0.   LOS: 4 Celia Friedland 11/5/202010:09 AM

## 2019-06-14 NOTE — Progress Notes (Signed)
Patient pulling at medical equipment and has pulled out one IV.  Left arm 20g wrapped to prevent being pulled out. Cardiac monitor has been put back on several times. Multiple attempts have been made to make patient comfortable and redirect patient.  Patient continues to say he wants to go home.  Tele sitter order obtained and placed in room. Nurse will continue to monitor closely.

## 2019-06-14 NOTE — Plan of Care (Signed)
Patient is extubated to 2 lpm o2 nasal canula

## 2019-06-14 NOTE — Progress Notes (Signed)
Propofol decreases to 67mcg for vent wean and wake up assessment.

## 2019-06-14 NOTE — Progress Notes (Signed)
Propofol decreased to 75mcg and [place] with cardiopulmonary placed patient in spontaneous mode for vent wean.  1045 patient extubated and placed on 2L nasal cannula. Patient has not followed any simple when propofol decreased and turned off.  Per Dr. Lanney Gins patient has documented cognitive impairment.

## 2019-06-15 DIAGNOSIS — F10921 Alcohol use, unspecified with intoxication delirium: Secondary | ICD-10-CM

## 2019-06-15 LAB — CBC WITH DIFFERENTIAL/PLATELET
Abs Immature Granulocytes: 0.1 10*3/uL — ABNORMAL HIGH (ref 0.00–0.07)
Basophils Absolute: 0 10*3/uL (ref 0.0–0.1)
Basophils Relative: 1 %
Eosinophils Absolute: 0.1 10*3/uL (ref 0.0–0.5)
Eosinophils Relative: 2 %
HCT: 31.6 % — ABNORMAL LOW (ref 39.0–52.0)
Hemoglobin: 10.4 g/dL — ABNORMAL LOW (ref 13.0–17.0)
Immature Granulocytes: 3 %
Lymphocytes Relative: 20 %
Lymphs Abs: 0.8 10*3/uL (ref 0.7–4.0)
MCH: 28.7 pg (ref 26.0–34.0)
MCHC: 32.9 g/dL (ref 30.0–36.0)
MCV: 87.3 fL (ref 80.0–100.0)
Monocytes Absolute: 0.6 10*3/uL (ref 0.1–1.0)
Monocytes Relative: 14 %
Neutro Abs: 2.4 10*3/uL (ref 1.7–7.7)
Neutrophils Relative %: 60 %
Platelets: 154 10*3/uL (ref 150–400)
RBC: 3.62 MIL/uL — ABNORMAL LOW (ref 4.22–5.81)
RDW: 12.2 % (ref 11.5–15.5)
WBC: 4 10*3/uL (ref 4.0–10.5)
nRBC: 0 % (ref 0.0–0.2)

## 2019-06-15 LAB — RENAL FUNCTION PANEL
Albumin: 2.6 g/dL — ABNORMAL LOW (ref 3.5–5.0)
Anion gap: 10 (ref 5–15)
BUN: 52 mg/dL — ABNORMAL HIGH (ref 6–20)
CO2: 30 mmol/L (ref 22–32)
Calcium: 7.6 mg/dL — ABNORMAL LOW (ref 8.9–10.3)
Chloride: 99 mmol/L (ref 98–111)
Creatinine, Ser: 4.38 mg/dL — ABNORMAL HIGH (ref 0.61–1.24)
GFR calc Af Amer: 16 mL/min — ABNORMAL LOW (ref >60)
GFR calc non Af Amer: 14 mL/min — ABNORMAL LOW (ref >60)
Glucose, Bld: 126 mg/dL — ABNORMAL HIGH (ref 70–99)
Phosphorus: 5.3 mg/dL — ABNORMAL HIGH (ref 2.5–4.6)
Potassium: 3.9 mmol/L (ref 3.5–5.1)
Sodium: 139 mmol/L (ref 135–145)

## 2019-06-15 LAB — CULTURE, RESPIRATORY W GRAM STAIN: Culture: NORMAL

## 2019-06-15 LAB — MAGNESIUM: Magnesium: 2.2 mg/dL (ref 1.7–2.4)

## 2019-06-15 LAB — CALCIUM, IONIZED: Calcium, Ionized, Serum: 3.4 mg/dL — ABNORMAL LOW (ref 4.5–5.6)

## 2019-06-15 LAB — CK: Total CK: 2424 U/L — ABNORMAL HIGH (ref 49–397)

## 2019-06-15 MED ORDER — DEXMEDETOMIDINE HCL IN NACL 400 MCG/100ML IV SOLN
0.4000 ug/kg/h | INTRAVENOUS | Status: DC
  Administered 2019-06-15: 0.9 ug/kg/h via INTRAVENOUS
  Administered 2019-06-15: 1.2 ug/kg/h via INTRAVENOUS
  Administered 2019-06-15: 0.7 ug/kg/h via INTRAVENOUS
  Administered 2019-06-15: 1.2 ug/kg/h via INTRAVENOUS
  Administered 2019-06-16: 1 ug/kg/h via INTRAVENOUS
  Administered 2019-06-16: 1.2 ug/kg/h via INTRAVENOUS
  Administered 2019-06-17: 0.8 ug/kg/h via INTRAVENOUS
  Administered 2019-06-17: 1.2 ug/kg/h via INTRAVENOUS
  Administered 2019-06-17: 0.9 ug/kg/h via INTRAVENOUS
  Administered 2019-06-17: 1 ug/kg/h via INTRAVENOUS
  Administered 2019-06-18: 0.4 ug/kg/h via INTRAVENOUS
  Administered 2019-06-18 (×2): 0.7 ug/kg/h via INTRAVENOUS
  Administered 2019-06-18 – 2019-06-19 (×2): 0.8 ug/kg/h via INTRAVENOUS
  Filled 2019-06-15 (×18): qty 100

## 2019-06-15 MED ORDER — SODIUM CHLORIDE 0.9 % IV SOLN
500.0000 mg | INTRAVENOUS | Status: AC
  Administered 2019-06-15 – 2019-06-16 (×2): 500 mg via INTRAVENOUS
  Filled 2019-06-15 (×2): qty 500

## 2019-06-15 MED ORDER — FENTANYL CITRATE (PF) 100 MCG/2ML IJ SOLN
12.5000 ug | Freq: Once | INTRAMUSCULAR | Status: AC
  Administered 2019-06-16: 12.5 ug via INTRAVENOUS
  Filled 2019-06-15: qty 2

## 2019-06-15 MED ORDER — SODIUM CHLORIDE 0.9 % IV SOLN
INTRAVENOUS | Status: DC
  Administered 2019-06-15 – 2019-06-19 (×3): via INTRAVENOUS

## 2019-06-15 NOTE — Progress Notes (Addendum)
CRITICAL CARE NOTE  CC  follow up respiratory failure  SUBJECTIVE Pt was extubated yesterday. He is confused and still pulling out his IVs. Pt has a sitter in the room with him.   BP (!) 148/80   Pulse 67   Temp (!) 97 F (36.1 C)   Resp 14   Ht 6\' 4"  (1.93 m)   Wt 93.2 kg   SpO2 100%   BMI 25.01 kg/m    I/O last 3 completed shifts: In: 3316.6 [I.V.:3216.6; IV Piggyback:100] Out: [id number] [Urine:1555; Other:3292] Total I/O In: 498.7 [I.V.:498.7] Out: 325 [Urine:325]  SpO2: 100 % O2 Flow Rate (L/min): 2 L/min FiO2 (%): 21 %   SIGNIFICANT EVENTS 11/3: Intubated, central line placed in IJ. CRRT started  11/4: Remains intubated at FiO2 of 21%. Pt received calcium replacement and is on sodium bicarbonate infusion. Pt is sedated with propofol. Also started on rocephin and azithromycin for abnormal lung sounds and elevated procalcitonin level.  11/5 -pt s/p weaning trial with liberation from MV, will stop CVVH and remove trialysis 11/6: Pt extubated. He is confused. Creatinine is worsening (4.38 today). Pt was on bicarb drip and switched to NS today per nephrology recommendations.   REVIEW OF SYSTEMS - Patient cannot answer questions due to AMS  PHYSICAL EXAMINATION:  GENERAL: pt is awake and sitting up, but is confused. Breathing comfortably on room air HEAD: Normocephalic, atraumatic.  EYES: Pupils equal, round, reactive to light.  No scleral icterus.  MOUTH: Moist mucosal membrane. NECK: Supple.  PULMONARY: +rhonchi  CARDIOVASCULAR: S1 and S2. Regular rate and rhythm. No murmurs, rubs, or gallops.  GASTROINTESTINAL: Soft, nontender, -distended. No masses. Positive bowel sounds. No hepatosplenomegaly.  MUSCULOSKELETAL: No swelling, clubbing, or edema.  NEUROLOGIC: awake, confused AMS SKIN:intact,warm,dry  MEDICATIONS: I have reviewed all medications and confirmed regimen as documented   CULTURE RESULTS   Recent Results (from the past 240 hour(s))  SARS Coronavirus  2 by RT PCR (hospital order, performed in [place] hospital lab)     Status: None   Collection Time: 06/10/19 10:02 PM  Result Value Ref Range Status   SARS Coronavirus 2 NEGATIVE NEGATIVE Final    Comment: (NOTE) If result is NEGATIVE SARS-CoV-2 target nucleic acids are NOT DETECTED. The SARS-CoV-2 RNA is generally detectable in upper and lower  respiratory specimens during the acute phase of infection. The lowest  concentration of SARS-CoV-2 viral copies this assay can detect is 250  copies / mL. A negative result does not preclude SARS-CoV-2 infection  and should not be used as the sole basis for treatment or other  patient management decisions.  A negative result may occur with  improper specimen collection / handling, submission of specimen other  than nasopharyngeal swab, presence of viral mutation(s) within the  areas targeted by this assay, and inadequate number of viral copies  (<250 copies / mL). A negative result must be combined with clinical  observations, patient history, and epidemiological information. If result is POSITIVE SARS-CoV-2 target nucleic acids are DETECTED. The SARS-CoV-2 RNA is generally detectable in upper and lower  respiratory specimens dur ing the acute phase of infection.  Positive  results are indicative of active infection with SARS-CoV-2.  Clinical  correlation with patient history and other diagnostic information is  necessary to determine patient infection status.  Positive results do  not rule out bacterial infection or co-infection with other viruses. If result is PRESUMPTIVE POSTIVE SARS-CoV-2 nucleic acids MAY BE PRESENT.   A presumptive positive result was  obtained on the submitted specimen  and confirmed on repeat testing.  While 2019 novel coronavirus  (SARS-CoV-2) nucleic acids may be present in the submitted sample  additional confirmatory testing may be necessary for epidemiological  and / or clinical management purposes  to  differentiate between  SARS-CoV-2 and other Sarbecovirus currently known to infect humans.  If clinically indicated additional testing with an alternate test  methodology [phone]) is advised. The SARS-CoV-2 RNA is generally  detectable in upper and lower respiratory sp ecimens during the acute  phase of infection. The expected result is Negative. Fact Sheet for Patients:  StrictlyIdeas.no Fact Sheet for Healthcare Providers: BankingDealers.co.za This test is not yet approved or cleared by the [place] FDA and has been authorized for detection and/or diagnosis of SARS-CoV-2 by FDA under an Emergency Use Authorization (EUA).  This EUA will remain in effect (meaning this test can be used) for the duration of the COVID-19 declaration under Section 564(b)(1) of the Act, 21 U.S.C. section 360bbb-3(b)(1), unless the authorization is terminated or revoked sooner. Performed at [facility], [place] [address] [place], [place] [address]   MRSA PCR Screening     Status: None   Collection Time: 06/11/19 12:30 PM   Specimen: Nasopharyngeal  Result Value Ref Range Status   MRSA by PCR NEGATIVE NEGATIVE Final    Comment:        The GeneXpert MRSA Assay (FDA approved for NASAL specimens only), is one component of a comprehensive MRSA colonization surveillance program. It is not intended to diagnose MRSA infection nor to guide or monitor treatment for MRSA infections. Performed at [facility], [address] [place], [place] [address]   CULTURE, BLOOD (ROUTINE X 2) w Reflex to ID Panel     Status: None (Preliminary result)   Collection Time: 06/12/19  1:34 PM   Specimen: BLOOD  Result Value Ref Range Status   Specimen Description BLOOD BLOOD RIGHT HAND  Final   Special Requests   Final    BOTTLES DRAWN AEROBIC AND ANAEROBIC Blood Culture adequate volume   Culture   Final    NO GROWTH 2 DAYS Performed at [facility], [address] [place], [place] [address]    Report Status PENDING  Incomplete  CULTURE, BLOOD (ROUTINE X 2) w Reflex to ID Panel     Status: None (Preliminary result)   Collection Time: 06/12/19  1:35 PM   Specimen: BLOOD  Result Value Ref Range Status   Specimen Description BLOOD BLOOD LEFT FOREARM  Final   Special Requests   Final    BOTTLES DRAWN AEROBIC AND ANAEROBIC Blood Culture adequate volume   Culture   Final    NO GROWTH 2 DAYS Performed at [facility], [address] [place], [place] [address]    Report Status PENDING  Incomplete  Culture, respiratory (non-expectorated)     Status: None   Collection Time: 06/13/19 12:10 AM   Specimen: Tracheal Aspirate; Respiratory  Result Value Ref Range Status   Specimen Description   Final    TRACHEAL ASPIRATE Performed at [facility], [address] [place], [place] [address]    Special Requests   Final    NONE Performed at [facility], [place], [place] [address]    Gram Stain   Final    RARE WBC PRESENT, PREDOMINANTLY PMN MODERATE GRAM POSITIVE COCCI IN PAIRS IN CLUSTERS FEW GRAM NEGATIVE RODS    Culture  Final    FEW Consistent with normal respiratory flora. Performed at [facility], [place] [address], [place], [place] [address]    Report Status 06/15/2019 FINAL  Final          IMAGING    No results found.   CBC    Component Value Date/Time   WBC 4.0 06/15/2019 0500   RBC 3.62 (L) 06/15/2019 0500   HGB 10.4 (L) 06/15/2019 0500   HCT 31.6 (L) 06/15/2019 0500   PLT 154 06/15/2019 0500   MCV 87.3 06/15/2019 0500   MCH 28.7 06/15/2019 0500   MCHC 32.9 06/15/2019 0500   RDW 12.2 06/15/2019 0500   LYMPHSABS 0.8 06/15/2019 0500   MONOABS 0.6 06/15/2019 0500   EOSABS 0.1 06/15/2019 0500   BASOSABS 0.0 06/15/2019 0500   CMP Latest Ref Rng & Units 06/15/2019 06/14/2019 06/13/2019  Glucose 70 - 99 mg/dL 126(H) 122(H) -  BUN 6 - 20 mg/dL 52(H) 54(H) -   Creatinine 0.61 - 1.24 mg/dL 4.38(H) 3.58(H) -  Sodium 135 - 145 mmol/L 139 138 -  Potassium 3.5 - 5.1 mmol/L 3.9 4.0 -  Chloride 98 - 111 mmol/L 99 99 -  CO2 22 - 32 mmol/L 30 30 -  Calcium 8.9 - 10.3 mg/dL 7.6(L) 7.1(L) -  Total Protein 6.5 - 8.1 g/dL - - 6.4(L)  Total Bilirubin 0.3 - 1.2 mg/dL - - 0.9  Alkaline Phos 38 - 126 U/L - - 59  AST 15 - 41 U/L - - 81(H)  ALT 0 - 44 U/L - - 93(H)      Indwelling Urinary Catheter continued, requirement due to   Reason to continue Indwelling Urinary Catheter strict Intake/Output monitoring for hemodynamic instability   Central Line/ continued, requirement due to  Reason to continue [place] of central venous pressure or other hemodynamic parameters and poor IV access        ASSESSMENT AND PLAN SYNOPSIS  Altered mental status  -Pt is confused s/p extubation, but pt is cognitively impaired at baseline -Recurrent agitation requiring frequent reorientation, sitters and redosing of sedation to keep to a RASS 0  Renal Failure-stage IV  -Creatinine worsening, monitor - consider CRRT again  -Continue hydration with NS -Likely due to rhabdomyolysis -Nephrology on case-appreciate input -follow chem 7 -follow UO -continue Foley Catheter-assess need daily   Rhabdomyolysis -Pt currently on NS infusion -CK improved today (2424) -Nephrology on case-appreciate input  ID -continue IV abx as prescribed - pt is on no abx at this time -follow up cultures  GI/Nutrition GI PROPHYLAXIS as indicated DIET--> restricted salt diet  Constipation protocol as indicated  ENDO - ICU hypoglycemic\Hyperglycemia protocol -check FSBS per protocol   ELECTROLYTES -follow labs as needed -phosphorous elevated at 5.3 - monitor levels -replace as needed  -pharmacy consultation   DVT/GI PRX ordered -SCDs  TRANSFUSIONS AS NEEDED MONITOR FSBS ASSESS the need for LABS as needed    Critical care provider statement:   Critical care time (minutes):32 Critical care time was exclusive of: Separately billable procedures and treating other patients Critical care was necessary to treat or prevent imminent or life-threatening deterioration of the following conditions:Altered mental status with lethargy, metabolic encephalopathy, rhabdomyolysis, stage IV acute kidney failure, multiple comorbid conditions Critical care was time spent personally by me on the following activities: Development of treatment plan with patient or surrogate, discussions with consultants, evaluation of patient's response to treatment, examination of patient, obtaining history from patient or surrogate, ordering and performing treatments  and interventions, ordering and review of laboratory studies and re-evaluation of patient's condition. I assumed direction of critical care for this patient from another provider in my specialty: no   This document was prepared using Dragon voice recognition software and may include unintentional dictation errors.    Ottie Glazier, M.D.  Division of Pulmonary &Critical [place]

## 2019-06-15 NOTE — Progress Notes (Signed)
[facility]  ROUNDING NOTE   Subjective:  Patient has been extubated.  Urine output 1.2 L over the preceding 24 hours.  Otherwise creatinine a bit higher this a.m. Patient in mitten restraints as he was pulling lines.   Objective:  Vital signs in last 24 hours:  Temp:  [97.3 F (36.3 C)-98.2 F (36.8 C)] 97.3 F (36.3 C) (11/06 0700) Pulse Rate:  [60-102] 60 (11/06 0700) Resp:  [8-18] 9 (11/06 0700) BP: (130-185)/(72-95) 130/77 (11/06 0700) SpO2:  [95 %-100 %] 100 % (11/06 0700) FiO2 (%):  [21 %] 21 % (11/05 1010) Weight:  [93.2 kg] 93.2 kg (11/06 0441)  Weight change: -1.3 kg Filed Weights   06/13/19 0500 06/14/19 0500 06/15/19 0441  Weight: 96 kg 94.5 kg 93.2 kg    Intake/Output: I/O last 3 completed shifts: In: 3316.6 [I.V.:3216.6; IV Piggyback:100] Out: 8850 [Urine:1555; Other:3292]   Intake/Output this shift:  No intake/output data recorded.  Physical Exam: General: Critically ill-appearing  Head: Normocephalic atraumatic  Eyes: Anicteric  Neck: Supple, trachea midline  Lungs:  Clear to auscultation, on ventilator.  Heart: S1S2 no obvious pericardial friction rub.  Abdomen:  Soft, nontender, bowel sounds present  Extremities: No peripheral edema.  Neurologic: Sedated at the moment  Skin: No lesions  GU: Foley catheter in place    Basic Metabolic Panel: Recent Labs  Lab 06/12/19 0508 06/12/19 2226 06/13/19 0443 06/13/19 1558 06/14/19 0423 06/15/19 0500  NA 134* 135 137 137 138 139  K 3.9 3.6 3.3* 3.8 4.0 3.9  CL 90* 91* 93* 97* 99 99  CO2 21* 26 28 28 30 30   GLUCOSE 156* 161* 142* 123* 122* 126*  BUN 193* 163* 147* 104* 54* 52*  CREATININE 11.89* 9.94* 8.56* 5.97* 3.58* 4.38*  CALCIUM 5.3* 5.8* 6.0* 6.4* 7.1* 7.6*  MG 2.5*  --  2.4  --  2.4 2.2  PHOS 9.0* 7.0* 6.5* 4.5 3.6 5.3*    Liver Function Tests: Recent Labs  Lab 06/10/19 1907 06/11/19 0421 06/12/19 0508  06/13/19 0443 06/13/19 1558 06/13/19 1803 06/14/19 0423  06/15/19 0500  AST 450* 327* 156*  --  85*  --  81*  --   --   ALT 237* 208* 141*  --  99*  --  93*  --   --   ALKPHOS 77 71 64  --  55  --  59  --   --   BILITOT 1.0 1.4* 1.1  --  0.7  --  0.9  --   --   PROT 7.9 7.3 6.4*  --  6.0*  --  6.4*  --   --   ALBUMIN 3.5 3.4*  3.3* 2.9*   < > 2.5*  2.5* 2.7* 2.8* 2.6* 2.6*   < > = values in this interval not displayed.   Recent Labs  Lab 06/10/19 1907  LIPASE 339*   Recent Labs  Lab 06/10/19 2058  AMMONIA 22    CBC: Recent Labs  Lab 06/10/19 1907 06/11/19 0421 06/12/19 0508 06/13/19 0443 06/15/19 0500  WBC 9.3 8.4 5.6 4.5 4.0  NEUTROABS  --   --  4.0 2.7 2.4  HGB 15.2 14.2 12.6* 11.3* 10.4*  HCT 44.6 40.9 35.3* 32.2* 31.6*  MCV 83.4 82.6 80.6 81.9 87.3  PLT 201 186 163 157 154    Cardiac Enzymes: Recent Labs  Lab 06/11/19 1451 06/13/19 0443 06/14/19 0423 06/15/19 0500  CKTOTAL 23,960* 10,566* 5,037* 2,424*    BNP: Invalid input(s): POCBNP  CBG: Recent Labs  Lab 06/14/19 0332 06/14/19 0734 06/14/19 1131 06/14/19 1536 06/14/19 1956  GLUCAP 131* 128* 87 91 102*    Microbiology: Results for orders placed or performed during the hospital encounter of 06/10/19  SARS Coronavirus 2 by RT PCR (hospital order, performed in [place] hospital lab)     Status: None   Collection Time: 06/10/19 10:02 PM  Result Value Ref Range Status   SARS Coronavirus 2 NEGATIVE NEGATIVE Final    Comment: (NOTE) If result is NEGATIVE SARS-CoV-2 target nucleic acids are NOT DETECTED. The SARS-CoV-2 RNA is generally detectable in upper and lower  respiratory specimens during the acute phase of infection. The lowest  concentration of SARS-CoV-2 viral copies this assay can detect is 250  copies / mL. A negative result does not preclude SARS-CoV-2 infection  and should not be used as the sole basis for treatment or other  patient management decisions.  A negative result may occur with  improper specimen collection / handling,  submission of specimen other  than nasopharyngeal swab, presence of viral mutation(s) within the  areas targeted by this assay, and inadequate number of viral copies  (<250 copies / mL). A negative result must be combined with clinical  observations, patient history, and epidemiological information. If result is POSITIVE SARS-CoV-2 target nucleic acids are DETECTED. The SARS-CoV-2 RNA is generally detectable in upper and lower  respiratory specimens dur ing the acute phase of infection.  Positive  results are indicative of active infection with SARS-CoV-2.  Clinical  correlation with patient history and other diagnostic information is  necessary to determine patient infection status.  Positive results do  not rule out bacterial infection or co-infection with other viruses. If result is PRESUMPTIVE POSTIVE SARS-CoV-2 nucleic acids MAY BE PRESENT.   A presumptive positive result was obtained on the submitted specimen  and confirmed on repeat testing.  While 2019 novel coronavirus  (SARS-CoV-2) nucleic acids may be present in the submitted sample  additional confirmatory testing may be necessary for epidemiological  and / or clinical management purposes  to differentiate between  SARS-CoV-2 and other Sarbecovirus currently known to infect humans.  If clinically indicated additional testing with an alternate test  methodology [phone] is advised. The SARS-CoV-2 RNA is generally  detectable in upper and lower respiratory sp ecimens during the acute  phase of infection. The expected result is Negative. Fact Sheet for Patients:  StrictlyIdeas.no Fact Sheet for Healthcare Providers: BankingDealers.co.za This test is not yet approved or cleared by the [place] FDA and has been authorized for detection and/or diagnosis of SARS-CoV-2 by FDA under an Emergency Use Authorization (EUA).  This EUA will remain in effect (meaning this test can be  used) for the duration of the COVID-19 declaration under Section 564(b)(1) of the Act, 21 U.S.C. section 360bbb-3(b)(1), unless the authorization is terminated or revoked sooner. Performed at [facility], [place] [address] [place], [place] [address]   MRSA PCR Screening     Status: None   Collection Time: 06/11/19 12:30 PM   Specimen: Nasopharyngeal  Result Value Ref Range Status   MRSA by PCR NEGATIVE NEGATIVE Final    Comment:        The GeneXpert MRSA Assay (FDA approved for NASAL specimens only), is one component of a comprehensive MRSA colonization surveillance program. It is not intended to diagnose MRSA infection nor to guide or monitor treatment for MRSA infections. Performed at [facility], [place], [place]  [address]   CULTURE, BLOOD (ROUTINE X 2) w Reflex to ID Panel     Status: None (Preliminary result)   Collection Time: 06/12/19  1:34 PM   Specimen: BLOOD  Result Value Ref Range Status   Specimen Description BLOOD BLOOD RIGHT HAND  Final   Special Requests   Final    BOTTLES DRAWN AEROBIC AND ANAEROBIC Blood Culture adequate volume   Culture   Final    NO GROWTH 2 DAYS Performed at [facility], [address], [place], [place] [address]    Report Status PENDING  Incomplete  CULTURE, BLOOD (ROUTINE X 2) w Reflex to ID Panel     Status: None (Preliminary result)   Collection Time: 06/12/19  1:35 PM   Specimen: BLOOD  Result Value Ref Range Status   Specimen Description BLOOD BLOOD LEFT FOREARM  Final   Special Requests   Final    BOTTLES DRAWN AEROBIC AND ANAEROBIC Blood Culture adequate volume   Culture   Final    NO GROWTH 2 DAYS Performed at [facility], [address] [place], [place] [address]    Report Status PENDING  Incomplete  Culture, respiratory (non-expectorated)     Status: None (Preliminary result)   Collection Time: 06/13/19 12:10 AM   Specimen: Tracheal Aspirate; Respiratory  Result  Value Ref Range Status   Specimen Description   Final    TRACHEAL ASPIRATE Performed at [facility], [address] [place], [place] [address]    Special Requests   Final    NONE Performed at [facility], [address] [place], [place] [address]    Gram Stain   Final    RARE WBC PRESENT, PREDOMINANTLY PMN MODERATE GRAM POSITIVE COCCI IN PAIRS IN CLUSTERS FEW GRAM NEGATIVE RODS    Culture   Final    CULTURE REINCUBATED FOR BETTER GROWTH Performed at [facility], [place] [address], [place], [place] [address]    Report Status PENDING  Incomplete    Coagulation Studies: Recent Labs    06/12/19 1138  LABPROT 16.3*  INR 1.3*    Urinalysis: No results for input(s): COLORURINE, LABSPEC, PHURINE, GLUCOSEU, HGBUR, BILIRUBINUR, KETONESUR, PROTEINUR, UROBILINOGEN, NITRITE, LEUKOCYTESUR in the last 72 hours.  Invalid input(s): APPERANCEUR    Imaging: No results found.   Medications:   . sodium chloride Stopped (06/14/19 1428)  . cefTRIAXone (ROCEPHIN)  IV Stopped (06/14/19 1811)  . dexmedetomidine (PRECEDEX) IV infusion 1.2 mcg/kg/hr (06/15/19 0400)  . sodium bicarbonate 150 mEq in dextrose 5% 1000 mL 150 mEq (06/15/19 0629)   . azithromycin  500 mg Per Tube q1800  . chlorhexidine gluconate (MEDLINE KIT)  15 mL Mouth Rinse BID  . Chlorhexidine Gluconate Cloth  6 each Topical Daily  . clonazePAM  0.5 mg Oral BID  . folic acid  1 mg Oral Daily  . heparin  5,000 Units Subcutaneous Q8H  . multivitamin with minerals  1 tablet Oral Daily  . sodium chloride flush  3 mL Intravenous Once  . sodium chloride flush  3 mL Intravenous Q12H  . thiamine  100 mg Oral Daily   sodium chloride, fentaNYL (SUBLIMAZE) injection, hydrALAZINE, ipratropium-albuterol, labetalol, ondansetron **OR** ondansetron (ZOFRAN) IV, traZODone  Assessment/ Plan:  58 y.o. male with a PMHx of alcohol abuse and impaired cognitive function, who was admitted to [facility] on 06/10/2019 for  evaluation of weakness, diarrhea, nausea, and vomiting.    1.  Acute kidney injury suspected secondary to prolonged volume depletion/rhabdomyolysis.  Patient underwent continuous renal placement therapy with improvement in azotemia. -Currently off of CRRT.  Urine output 1.2 L over the preceding 24 hours.  No urgent indication to restart CRRT at this time.  Continue to monitor urine output closely.  2.  Metabolic acidosis.  Discontinue sodium bicarbonate drip at the moment.  Switch over to 0.9 normal saline at 50 cc/h.  3.  Alcohol abuse.  Management as per hospitalist and critical care.  4.  Hypokalemia.  Resolved as potassium currently 3.9.   LOS: 5 Martin Diaz 11/6/20209:12 AM

## 2019-06-15 NOTE — Progress Notes (Addendum)
PROGRESS NOTE    [name]  DJS:[id number] DOB: 04/26/1961 DOA: 06/10/2019 PCP: Patient, No Pcp Per      Brief Narrative:  Martin Diaz is a 58 y.o. M with a history of ETOH abuse and intellectual disability (lives in a boarding house, Aunt/Uncle guardian) admitted 11/1 with reports of several days of N/V/D, shaking and weakness.  ER evaluation notable for hemodynamic stability, labs - BUN 188, Sr Cr 11.56, Na 130, Cl 90, corrected Ca 6.8, HCO3 17, AST 450 / ALT 237, alk phos 77, lipase 339, INR 1.3, ammonia 22, ETOH negative. RUQ [place] negative for gallstones or hepatobiliary abnormality.  Treated with IVF and calcium gluconate.  Renal US negative for obstruction. Developed worsening mental status and intubated for airway protection.  Found to have rhabdomyolysis, requiring HD.   Assessment & Plan:  Acute Kidney Injury  Rhabdomyolysis  Metabolic Acidosis  Left Renal Cyst  Baseline sr cr normal.  Nephrology consulted 11/2.  Suspect AKI in setting of profound volume depletion and rhabdomyolysis (admit CK 24K). Renal US negative for hydronephrosis.  Cr improving -Trend BMP / [place] evaluation > plan 11/6 is to hold HD -No indication for emergent HD  -follow I/O's, CK -Monitor, replace electrolytes as indicated  -avoid nephrotoxic agents / ensure adequate renal perfusion  -defer follow up regarding renal cyst to Nephrology -keep HD cath for now   Pancreatitis  Lipase 339 on admit, ETOH abuse hx.  -trend lipase, glucose  Agitated Delirium  Suspect element of delirium superimposed on baseline intellectual disability. RR 7-9 on dexmedetomidine overnight -stop clonazepam  -wean dexmedetomidine as able -HOB elevated -discontinue fentanyl, trazodone -pending exam off sedation, may need neuro imaging   Acute Respiratory Insufficiency in setting of AMS Intubated for airway protection in setting of AKI, rhabdo.  Extubated 11/5 -elevated HOB  -minimize  sedating medications   -continue empiric abx for possible aspiration, D4/5  -change abx to IV given altered mental status -follow up CXR in am, to assess for infiltrate  Hypertension  Elevated BP during admit, concern for urgency.  BP imrpoved -PRN IV hydralazine, labetalol  Hypocalcemia Corrected Ca+ 11/6 = 8.7 -monitor, replace as indicated   ETOH Abuse  -continue thiamine, folate, MVI -monitor for ETOH withdrawal symptoms  -?if he still drinks given he lives in a group home       DVT prophylaxis: Heparin SQ Code Status: FULL CODE Family Communication: Aunt - Martin Diaz [phone] called for update 11/6, message left for return call.      Consultants:   PCCM   Procedures:   ETT 11/3 >> 11/5   R IJ HD Cath 11/3 >>   Diagnostic Tests: Renal US 11/1 >> medical renal disease of both kidneys, exophytic indeterminate nodule lateral aspect of the upper to mid left kidney 2.2 cm (query solid or complex cystic lesion, MR imaging recommended) ETOH 11/1 >> negative  [place] ABD Limited 11/1 >> no evidence of gallstones or hepatobiliary abnormality  Antimicrobials:   Azithro 11/3 (empiric) >>   Ceftriaxone 11/3 (empiric >>    Subjective: RN reports pt was agitated overnight, started on precedex > up to 1.2 mcg/kg/hr, notes pt not awake/interactive this am.  I/O - 1.2L UOP in 24h.  No rfever, respiratory distress. Still with many periods of agitation and unable to re-direct when off precedex / on lower doses  Objective: Vitals:   06/15/19 1300 06/15/19 1400 06/15/19 1500 06/15/19 1606  BP: 139/65 (!) 158/88 (!) 148/80 (!) 151/78  Pulse: 80 61 67 80  Resp: _0 Temp:  (!) 97 F (36.1 C)    TempSrc:      SpO2: 100% 100% 100% 100%  Weight:      Height:        Intake/Output Summary (Last 24 hours) at 06/15/2019 1821 Last data filed at 06/15/2019 1651 Gross per 24 hour  Intake 954.47 ml  Output 1125 ml  Net -170.53 ml   Filed Weights   06/13/19 0500  06/14/19 0500 06/15/19 0441  Weight: 96 kg 94.5 kg 93.2 kg    Examination: General appearance: adult male lying in bed in NAD.   HEENT: Anicteric, conjunctiva pink, lids and lashes normal. No nasal deformity, discharge, epistaxis.  Lips moist.   Skin: Warm and dry.  No jaundice.  No suspicious rashes or lesions. Cardiac: S1-S2 RRR, no murmurs appreciated. SB-SR on monitor.  Capillary refill is brisk, <3sec.  JVP normal/not visible.  No LE edema.  Radial pulses 2+ and symmetric. Respiratory: shallow, non-labored, snoring, resp rate 9-12/min, lungs bilaterally clear. Abdomen: Abdomen soft.  No ascites, distension, hepatosplenomegaly.   MSK: No deformities or effusions. Neuro: sedate, slight facial grimace with deep painful stimulation, pupils 2-94m Psych: unable to assess     Data Reviewed: I have personally reviewed following labs and imaging studies:  CBC: Recent Labs  Lab 06/10/19 1907 06/11/19 0421 06/12/19 0508 06/13/19 0443 06/15/19 0500  WBC 9.3 8.4 5.6 4.5 4.0  NEUTROABS  --   --  4.0 2.7 2.4  HGB 15.2 14.2 12.6* 11.3* 10.4*  HCT 44.6 40.9 35.3* 32.2* 31.6*  MCV 83.4 82.6 80.6 81.9 87.3  PLT 201 186 163 157 1696  Basic Metabolic Panel: Recent Labs  Lab 06/12/19 0508 06/12/19 2226 06/13/19 0443 06/13/19 1558 06/14/19 0423 06/15/19 0500  NA 134* 135 137 137 138 139  K 3.9 3.6 3.3* 3.8 4.0 3.9  CL 90* 91* 93* 97* 99 99  CO2 21* _1 GLUCOSE 156* 161* 142* 123* 122* 126*  BUN 193* 163* 147* 104* 54* 52*  CREATININE 11.89* 9.94* 8.56* 5.97* 3.58* 4.38*  CALCIUM 5.3* 5.8* 6.0* 6.4* 7.1* 7.6*  MG 2.5*  --  2.4  --  2.4 2.2  PHOS 9.0* 7.0* 6.5* 4.5 3.6 5.3*   GFR: Estimated Creatinine Clearance: 23.1 mL/min (A) (by C-G formula based on SCr of 4.38 mg/dL (H)). Liver Function Tests: Recent Labs  Lab 06/10/19 1907 06/11/19 0421 06/12/19 0508  06/13/19 0443 06/13/19 1558 06/13/19 1803 06/14/19 0423 06/15/19 0500  AST 450* 327* 156*  --  85*  --   81*  --   --   ALT 237* 208* 141*  --  99*  --  93*  --   --   ALKPHOS 77 71 64  --  55  --  59  --   --   BILITOT 1.0 1.4* 1.1  --  0.7  --  0.9  --   --   PROT 7.9 7.3 6.4*  --  6.0*  --  6.4*  --   --   ALBUMIN 3.5 3.4*  3.3* 2.9*   < > 2.5*  2.5* 2.7* 2.8* 2.6* 2.6*   < > = values in this interval not displayed.   Recent Labs  Lab 06/10/19 1907  LIPASE 339*   Recent Labs  Lab 06/10/19 2058  AMMONIA 22   Coagulation Profile: Recent Labs  Lab 06/10/19 2058 06/12/19 1138  INR  1.3* 1.3*   Cardiac Enzymes: Recent Labs  Lab 06/11/19 1451 06/13/19 0443 06/14/19 0423 06/15/19 0500  CKTOTAL 23,960* 10,566* 5,037* 2,424*   BNP (last 3 results) No results for input(s): PROBNP in the last 8760 hours. HbA1C: Recent Labs    06/12/19 2226  HGBA1C 6.3*   CBG: Recent Labs  Lab 06/14/19 0332 06/14/19 0734 06/14/19 1131 06/14/19 1536 06/14/19 1956  GLUCAP 131* 128* 87 91 102*   Lipid Profile: Recent Labs    06/12/19 2226  TRIG 158*   Thyroid Function Tests: No results for input(s): TSH, T4TOTAL, FREET4, T3FREE, THYROIDAB in the last 72 hours. Anemia Panel: No results for input(s): VITAMINB12, FOLATE, FERRITIN, TIBC, IRON, RETICCTPCT in the last 72 hours. Urine analysis:    Component Value Date/Time   COLORURINE YELLOW (A) 06/11/2019 0316   APPEARANCEUR CLOUDY (A) 06/11/2019 0316   LABSPEC 1.012 06/11/2019 0316   PHURINE 5.0 06/11/2019 0316   GLUCOSEU NEGATIVE 06/11/2019 0316   HGBUR LARGE (A) 06/11/2019 0316   BILIRUBINUR NEGATIVE 06/11/2019 0316   KETONESUR NEGATIVE 06/11/2019 0316   PROTEINUR 100 (A) 06/11/2019 0316   NITRITE NEGATIVE 06/11/2019 0316   LEUKOCYTESUR SMALL (A) 06/11/2019 0316   Sepsis Labs: _0 (procalcitonin:4,lacticacidven:4)  ) Recent Results (from the past 240 hour(s))  SARS Coronavirus 2 by RT PCR (hospital order, performed in [facility] hospital lab)     Status: None   Collection Time: 06/10/19 10:02 PM  Result Value  Ref Range Status   SARS Coronavirus 2 NEGATIVE NEGATIVE Final    Comment: (NOTE) If result is NEGATIVE SARS-CoV-2 target nucleic acids are NOT DETECTED. The SARS-CoV-2 RNA is generally detectable in upper and lower  respiratory specimens during the acute phase of infection. The lowest  concentration of SARS-CoV-2 viral copies this assay can detect is 250  copies / mL. A negative result does not preclude SARS-CoV-2 infection  and should not be used as the sole basis for treatment or other  patient management decisions.  A negative result may occur with  improper specimen collection / handling, submission of specimen other  than nasopharyngeal swab, presence of viral mutation(s) within the  areas targeted by this assay, and inadequate number of viral copies  (<250 copies / mL). A negative result must be combined with clinical  observations, patient history, and epidemiological information. If result is POSITIVE SARS-CoV-2 target nucleic acids are DETECTED. The SARS-CoV-2 RNA is generally detectable in upper and lower  respiratory specimens dur ing the acute phase of infection.  Positive  results are indicative of active infection with SARS-CoV-2.  Clinical  correlation with patient history and other diagnostic information is  necessary to determine patient infection status.  Positive results do  not rule out bacterial infection or co-infection with other viruses. If result is PRESUMPTIVE POSTIVE SARS-CoV-2 nucleic acids MAY BE PRESENT.   A presumptive positive result was obtained on the submitted specimen  and confirmed on repeat testing.  While 2019 novel coronavirus  (SARS-CoV-2) nucleic acids may be present in the submitted sample  additional confirmatory testing may be necessary for epidemiological  and / or clinical management purposes  to differentiate between  SARS-CoV-2 and other Sarbecovirus currently known to infect humans.  If clinically indicated additional testing with an  alternate test  methodology [phone]) is advised. The SARS-CoV-2 RNA is generally  detectable in upper and lower respiratory sp ecimens during the acute  phase of infection. The expected result is Negative. Fact Sheet for Patients:  StrictlyIdeas.no Fact Sheet for  Healthcare Providers: BankingDealers.co.za This test is not yet approved or cleared by the [place] and has been authorized for detection and/or diagnosis of SARS-CoV-2 by FDA under an Emergency Use Authorization (EUA).  This EUA will remain in effect (meaning this test can be used) for the duration of the COVID-19 declaration under Section 564(b)(1) of the Act, 21 U.S.C. section 360bbb-3(b)(1), unless the authorization is terminated or revoked sooner. Performed at [facility], [place] [address], [place], [place] [address]   MRSA PCR Screening     Status: None   Collection Time: 06/11/19 12:30 PM   Specimen: Nasopharyngeal  Result Value Ref Range Status   MRSA by PCR NEGATIVE NEGATIVE Final    Comment:        The GeneXpert MRSA Assay (FDA approved for NASAL specimens only), is one component of a comprehensive MRSA colonization surveillance program. It is not intended to diagnose MRSA infection nor to guide or monitor treatment for MRSA infections. Performed at [facility], [address] [place], [place] [address]   CULTURE, BLOOD (ROUTINE X 2) w Reflex to ID Panel     Status: None (Preliminary result)   Collection Time: 06/12/19  1:34 PM   Specimen: BLOOD  Result Value Ref Range Status   Specimen Description BLOOD BLOOD RIGHT HAND  Final   Special Requests   Final    BOTTLES DRAWN AEROBIC AND ANAEROBIC Blood Culture adequate volume   Culture   Final    NO GROWTH 2 DAYS Performed at [facility], [address] [place], [place] [address]    Report Status PENDING  Incomplete  CULTURE, BLOOD (ROUTINE X 2) w Reflex to ID Panel      Status: None (Preliminary result)   Collection Time: 06/12/19  1:35 PM   Specimen: BLOOD  Result Value Ref Range Status   Specimen Description BLOOD BLOOD LEFT FOREARM  Final   Special Requests   Final    BOTTLES DRAWN AEROBIC AND ANAEROBIC Blood Culture adequate volume   Culture   Final    NO GROWTH 2 DAYS Performed at [facility], [address] [place], [place] [address]    Report Status PENDING  Incomplete  Culture, respiratory (non-expectorated)     Status: None   Collection Time: 06/13/19 12:10 AM   Specimen: Tracheal Aspirate; Respiratory  Result Value Ref Range Status   Specimen Description   Final    TRACHEAL ASPIRATE Performed at [facility], [address] [place], [place] [address]    Special Requests   Final    NONE Performed at [facility], [place], [place] [address]    Gram Stain   Final    RARE WBC PRESENT, PREDOMINANTLY PMN MODERATE GRAM POSITIVE COCCI IN PAIRS IN CLUSTERS FEW GRAM NEGATIVE RODS    Culture   Final    FEW Consistent with normal respiratory flora. Performed at [facility], [place] [address], [place], [place] [address]    Report Status 06/15/2019 FINAL  Final         Radiology Studies: No results found.      Scheduled Meds: . azithromycin  500 mg Per Tube q1800  . chlorhexidine gluconate (MEDLINE KIT)  15 mL Mouth Rinse BID  . Chlorhexidine Gluconate Cloth  6 each Topical Daily  . clonazePAM  0.5 mg Oral BID  . folic acid  1 mg Oral Daily  . heparin  5,000 Units Subcutaneous Q8H  .  multivitamin with minerals  1 tablet Oral Daily  . sodium chloride flush  3 mL Intravenous Once  . sodium chloride flush  3 mL Intravenous Q12H  . thiamine  100 mg Oral Daily   Continuous Infusions: . sodium chloride Stopped (06/14/19 1428)  . sodium chloride 50 mL/hr at 06/15/19 0924  . cefTRIAXone (ROCEPHIN)  IV 1 g (06/15/19 1735)  . dexmedetomidine (PRECEDEX) IV infusion 0.7 mcg/kg/hr  (06/15/19 1323)     LOS: 5 days    Time spent: 30 minutes   Noe Gens, AG-ACNP-[facility] 06/15/2019, 6:21 PM     Please page through [place]:  [email or web address] Password [email or web address] If 7PM-7AM, please contact night-coverage    Attending MD Note:  I have seen and examined the patient with NP student and agree with the note above which has been edited to reflect our agreed upon history, exam, and assessment/plan.    I have personally reviewed the orders for the patient, which were made under my direction.        [place]

## 2019-06-15 NOTE — Progress Notes (Signed)
Per Dr. Loleta Books decreased precedex and removed mittens earlier during shift. Patient has become agitated; removing his blood pressure cuff, leads, pulling his IV's and foley catheter. Spoke with Dr Loleta Books and orders to increase precedex. Patient has removed leads again, which he is now off the monitor. He will not allow [place] to place these back on him. Dr Delilah Shan notified as well. Continue to monitor, when patient becomes less agitated will place leads.

## 2019-06-15 NOTE — Progress Notes (Signed)
Patient restless, pulling at Leads, PIV, and trying to pull central line. Mittens were placed for patients safety and NP Hinton Dyer made aware. New orders given.

## 2019-06-16 LAB — RENAL FUNCTION PANEL
Albumin: 2.8 g/dL — ABNORMAL LOW (ref 3.5–5.0)
Anion gap: 13 (ref 5–15)
BUN: 61 mg/dL — ABNORMAL HIGH (ref 6–20)
CO2: 27 mmol/L (ref 22–32)
Calcium: 8.2 mg/dL — ABNORMAL LOW (ref 8.9–10.3)
Chloride: 102 mmol/L (ref 98–111)
Creatinine, Ser: 5.07 mg/dL — ABNORMAL HIGH (ref 0.61–1.24)
GFR calc Af Amer: 14 mL/min — ABNORMAL LOW (ref >60)
GFR calc non Af Amer: 12 mL/min — ABNORMAL LOW (ref >60)
Glucose, Bld: 101 mg/dL — ABNORMAL HIGH (ref 70–99)
Phosphorus: 5.8 mg/dL — ABNORMAL HIGH (ref 2.5–4.6)
Potassium: 4 mmol/L (ref 3.5–5.1)
Sodium: 142 mmol/L (ref 135–145)

## 2019-06-16 LAB — HEPATIC FUNCTION PANEL
ALT: 80 U/L — ABNORMAL HIGH (ref 0–44)
AST: 81 U/L — ABNORMAL HIGH (ref 15–41)
Albumin: 2.9 g/dL — ABNORMAL LOW (ref 3.5–5.0)
Alkaline Phosphatase: 75 U/L (ref 38–126)
Bilirubin, Direct: 0.3 mg/dL — ABNORMAL HIGH (ref 0.0–0.2)
Indirect Bilirubin: 0.9 mg/dL (ref 0.3–0.9)
Total Bilirubin: 1.2 mg/dL (ref 0.3–1.2)
Total Protein: 6.5 g/dL (ref 6.5–8.1)

## 2019-06-16 LAB — CBC
HCT: 35.5 % — ABNORMAL LOW (ref 39.0–52.0)
Hemoglobin: 11.8 g/dL — ABNORMAL LOW (ref 13.0–17.0)
MCH: 28.2 pg (ref 26.0–34.0)
MCHC: 33.2 g/dL (ref 30.0–36.0)
MCV: 84.9 fL (ref 80.0–100.0)
Platelets: 172 10*3/uL (ref 150–400)
RBC: 4.18 MIL/uL — ABNORMAL LOW (ref 4.22–5.81)
RDW: 12.1 % (ref 11.5–15.5)
WBC: 5 10*3/uL (ref 4.0–10.5)
nRBC: 0 % (ref 0.0–0.2)

## 2019-06-16 LAB — LIPASE, BLOOD: Lipase: 74 U/L — ABNORMAL HIGH (ref 11–51)

## 2019-06-16 LAB — MAGNESIUM: Magnesium: 2.4 mg/dL (ref 1.7–2.4)

## 2019-06-16 LAB — AMMONIA: Ammonia: 21 umol/L (ref 9–35)

## 2019-06-16 NOTE — Progress Notes (Signed)
CRITICAL CARE NOTE  CC  follow up respiratory failure  SUBJECTIVE Pt was extubated yesterday. He is confused and still pulling out his IVs. Pt has a sitter in the room with him.   BP (!) 157/89   Pulse (!) 54   Temp 97.8 F (36.6 C) (Oral)   Resp (!) 2   Ht 6\' 4"  (1.93 m)   Wt 93.2 kg   SpO2 100%   BMI 25.01 kg/m    I/O last 3 completed shifts: In: 2883.4 [I.V.:2418; IV Piggyback:465.4] Out: 1125 [Urine:1125] No intake/output data recorded.  SpO2: 100 % O2 Flow Rate (L/min): 2 L/min FiO2 (%): 21 %   SIGNIFICANT EVENTS 11/3: Intubated, central line placed in IJ. CRRT started  11/4: Remains intubated at FiO2 of 21%. Pt received calcium replacement and is on sodium bicarbonate infusion. Pt is sedated with propofol. Also started on rocephin and azithromycin for abnormal lung sounds and elevated procalcitonin level.  11/5 -pt s/p weaning trial with liberation from MV, will stop CVVH and remove trialysis 11/6: Pt extubated. He is confused. Creatinine is worsening (4.38 today). Pt was on bicarb drip and switched to NS today per nephrology recommendations.  11/7 -optimizing for downgrade, may require IHD , intermittent Precedex requirement due to his severe agitation  REVIEW OF SYSTEMS - Patient cannot answer questions due to baseline cognitive impairment  PHYSICAL EXAMINATION:  GENERAL: pt is awake and sitting up, but is confused. Breathing comfortably on room air HEAD: Normocephalic, atraumatic.  EYES: Pupils equal, round, reactive to light.  No scleral icterus.  MOUTH: Moist mucosal membrane. NECK: Supple.  PULMONARY: +rhonchi  CARDIOVASCULAR: S1 and S2. Regular rate and rhythm. No murmurs, rubs, or gallops.  GASTROINTESTINAL: Soft, nontender, -distended. No masses. Positive bowel sounds. No hepatosplenomegaly.  MUSCULOSKELETAL: No swelling, clubbing, or edema.  NEUROLOGIC: awake, confused AMS SKIN:intact,warm,dry  MEDICATIONS: I have reviewed all medications and  confirmed regimen as documented   CULTURE RESULTS   Recent Results (from the past 240 hour(s))  SARS Coronavirus 2 by RT PCR (hospital order, performed in [place] hospital lab)     Status: None   Collection Time: 06/10/19 10:02 PM  Result Value Ref Range Status   SARS Coronavirus 2 NEGATIVE NEGATIVE Final    Comment: (NOTE) If result is NEGATIVE SARS-CoV-2 target nucleic acids are NOT DETECTED. The SARS-CoV-2 RNA is generally detectable in upper and lower  respiratory specimens during the acute phase of infection. The lowest  concentration of SARS-CoV-2 viral copies this assay can detect is 250  copies / mL. A negative result does not preclude SARS-CoV-2 infection  and should not be used as the sole basis for treatment or other  patient management decisions.  A negative result may occur with  improper specimen collection / handling, submission of specimen other  than nasopharyngeal swab, presence of viral mutation(s) within the  areas targeted by this assay, and inadequate number of viral copies  (<250 copies / mL). A negative result must be combined with clinical  observations, patient history, and epidemiological information. If result is POSITIVE SARS-CoV-2 target nucleic acids are DETECTED. The SARS-CoV-2 RNA is generally detectable in upper and lower  respiratory specimens dur ing the acute phase of infection.  Positive  results are indicative of active infection with SARS-CoV-2.  Clinical  correlation with patient history and other diagnostic information is  necessary to determine patient infection status.  Positive results do  not rule out bacterial infection or co-infection with other viruses. If result is  PRESUMPTIVE POSTIVE SARS-CoV-2 nucleic acids MAY BE PRESENT.   A presumptive positive result was obtained on the submitted specimen  and confirmed on repeat testing.  While 2019 novel coronavirus  (SARS-CoV-2) nucleic acids may be present in the submitted sample   additional confirmatory testing may be necessary for epidemiological  and / or clinical management purposes  to differentiate between  SARS-CoV-2 and other Sarbecovirus currently known to infect humans.  If clinically indicated additional testing with an alternate test  methodology [phone]) is advised. The SARS-CoV-2 RNA is generally  detectable in upper and lower respiratory sp ecimens during the acute  phase of infection. The expected result is Negative. Fact Sheet for Patients:  StrictlyIdeas.no Fact Sheet for Healthcare Providers: BankingDealers.co.za This test is not yet approved or cleared by the [place] FDA and has been authorized for detection and/or diagnosis of SARS-CoV-2 by FDA under an Emergency Use Authorization (EUA).  This EUA will remain in effect (meaning this test can be used) for the duration of the COVID-19 declaration under Section 564(b)(1) of the Act, 21 U.S.C. section 360bbb-3(b)(1), unless the authorization is terminated or revoked sooner. Performed at [facility], [place] [address] [place], [place] [address]   MRSA PCR Screening     Status: None   Collection Time: 06/11/19 12:30 PM   Specimen: Nasopharyngeal  Result Value Ref Range Status   MRSA by PCR NEGATIVE NEGATIVE Final    Comment:        The GeneXpert MRSA Assay (FDA approved for NASAL specimens only), is one component of a comprehensive MRSA colonization surveillance program. It is not intended to diagnose MRSA infection nor to guide or monitor treatment for MRSA infections. Performed at [facility], [address] [place], [place] [address]   CULTURE, BLOOD (ROUTINE X 2) w Reflex to ID Panel     Status: None (Preliminary result)   Collection Time: 06/12/19  1:34 PM   Specimen: BLOOD  Result Value Ref Range Status   Specimen Description BLOOD BLOOD RIGHT HAND  Final   Special Requests   Final    BOTTLES DRAWN AEROBIC  AND ANAEROBIC Blood Culture adequate volume   Culture   Final    NO GROWTH 2 DAYS Performed at [facility], [address] [place], [place] [address]    Report Status PENDING  Incomplete  CULTURE, BLOOD (ROUTINE X 2) w Reflex to ID Panel     Status: None (Preliminary result)   Collection Time: 06/12/19  1:35 PM   Specimen: BLOOD  Result Value Ref Range Status   Specimen Description BLOOD BLOOD LEFT FOREARM  Final   Special Requests   Final    BOTTLES DRAWN AEROBIC AND ANAEROBIC Blood Culture adequate volume   Culture   Final    NO GROWTH 2 DAYS Performed at [facility], [address], [place], [place] [address]    Report Status PENDING  Incomplete  Culture, respiratory (non-expectorated)     Status: None   Collection Time: 06/13/19 12:10 AM   Specimen: Tracheal Aspirate; Respiratory  Result Value Ref Range Status   Specimen Description   Final    TRACHEAL ASPIRATE Performed at [facility], [address], [place], [place] [address]    Special Requests   Final    NONE Performed at [facility], [address] [place], [place] [address]    Gram Stain   Final    RARE WBC PRESENT, PREDOMINANTLY PMN MODERATE GRAM POSITIVE  COCCI IN PAIRS IN CLUSTERS FEW GRAM NEGATIVE RODS    Culture   Final    FEW Consistent with normal respiratory flora. Performed at [facility], [place] [address] [place], [place] [address]    Report Status 06/15/2019 FINAL  Final          IMAGING    No results found.   CBC    Component Value Date/Time   WBC 5.0 06/16/2019 0346   RBC 4.18 (L) 06/16/2019 0346   HGB 11.8 (L) 06/16/2019 0346   HCT 35.5 (L) 06/16/2019 0346   PLT 172 06/16/2019 0346   MCV 84.9 06/16/2019 0346   MCH 28.2 06/16/2019 0346   MCHC 33.2 06/16/2019 0346   RDW 12.1 06/16/2019 0346   LYMPHSABS 0.8 06/15/2019 0500   MONOABS 0.6 06/15/2019 0500   EOSABS 0.1 06/15/2019 0500   BASOSABS 0.0 06/15/2019 0500   CMP Latest Ref  Rng & Units 06/16/2019 06/15/2019 06/14/2019  Glucose 70 - 99 mg/dL 101(H) 126(H) 122(H)  BUN 6 - 20 mg/dL 61(H) 52(H) 54(H)  Creatinine 0.61 - 1.24 mg/dL 5.07(H) 4.38(H) 3.58(H)  Sodium 135 - 145 mmol/L 142 139 138  Potassium 3.5 - 5.1 mmol/L 4.0 3.9 4.0  Chloride 98 - 111 mmol/L 102 99 99  CO2 22 - 32 mmol/L 27 30 30   Calcium 8.9 - 10.3 mg/dL 8.2(L) 7.6(L) 7.1(L)  Total Protein 6.5 - 8.1 g/dL 6.5 - -  Total Bilirubin 0.3 - 1.2 mg/dL 1.2 - -  Alkaline Phos 38 - 126 U/L 75 - -  AST 15 - 41 U/L 81(H) - -  ALT 0 - 44 U/L 80(H) - -      Indwelling Urinary Catheter continued, requirement due to   Reason to continue Indwelling Urinary Catheter strict Intake/Output monitoring for hemodynamic instability   Central Line/ continued, requirement due to  Reason to continue [place] of central venous pressure or other hemodynamic parameters and poor IV access        ASSESSMENT AND PLAN SYNOPSIS  Altered mental status  -Pt is confused s/p extubation, but pt is cognitively impaired at baseline -Recurrent agitation requiring frequent reorientation, sitters and redosing of sedation to keep to a RASS 0  Renal Failure-stage IV  -Creatinine worsening, monitor -nephrology on case possibly needs IHD again -Continue hydration with NS -Likely due to rhabdomyolysis -Nephrology on case-appreciate input -follow chem 7 -follow UO -continue Foley Catheter-assess need daily   Rhabdomyolysis -Pt currently on NS infusion -CK improved today (2424) -Nephrology on case-appreciate input  ID -continue IV abx as prescribed - pt is on no abx at this time -follow up cultures  GI/Nutrition GI PROPHYLAXIS as indicated DIET--> restricted salt diet  Constipation protocol as indicated  ENDO - ICU hypoglycemic\Hyperglycemia protocol -check FSBS per protocol   ELECTROLYTES -follow labs as needed -phosphorous elevated at 5.3 - monitor levels -replace as needed  -pharmacy  consultation   DVT/GI PRX ordered -SCDs  TRANSFUSIONS AS NEEDED MONITOR FSBS ASSESS the need for LABS as needed    Critical care provider statement:  Critical care time (minutes):32 Critical care time was exclusive of: Separately billable procedures and treating other patients Critical care was necessary to treat or prevent imminent or life-threatening deterioration of the following conditions:Altered mental status with lethargy, metabolic encephalopathy, rhabdomyolysis, stage IV acute kidney failure, multiple comorbid conditions Critical care was time spent personally by me on the following activities: Development of treatment plan with patient or surrogate, discussions with consultants, evaluation of  patient's response to treatment, examination of patient, obtaining history from patient or surrogate, ordering and performing treatments and interventions, ordering and review of laboratory studies and re-evaluation of patient's condition. I assumed direction of critical care for this patient from another provider in my specialty: no   This document was prepared using Dragon voice recognition software and may include unintentional dictation errors.    Ottie Glazier, M.D.  Division of Pulmonary &Critical [place]

## 2019-06-16 NOTE — Progress Notes (Signed)
[facility]  ROUNDING NOTE   Subjective:  Patient seen and evaluated at bedside. Urine output documented 625 cc over the preceding 24 hours. Renal function slightly worse this a.m. with a BUN of 61 and creatinine of 5.07.   Objective:  Vital signs in last 24 hours:  Temp:  [97 F (36.1 C)-97.8 F (36.6 C)] 97.8 F (36.6 C) (11/06 1920) Pulse Rate:  [54-89] 54 (11/07 0630) Resp:  [2-25] 2 (11/07 0630) BP: (121-213)/(65-96) 157/89 (11/07 0630) SpO2:  [100 %] 100 % (11/07 0630)  Weight change:  Filed Weights   06/13/19 0500 06/14/19 0500 06/15/19 0441  Weight: 96 kg 94.5 kg 93.2 kg    Intake/Output: I/O last 3 completed shifts: In: 2883.4 [I.V.:2418; IV Piggyback:465.4] Out: 1125 [YWVPX:1062]   Intake/Output this shift:  No intake/output data recorded.  Physical Exam: General: Critically ill-appearing  Head: Normocephalic atraumatic  Eyes: Anicteric  Neck: Supple, trachea midline  Lungs:  Clear to auscultation, normal effrt  Heart: S1S2 no rub  Abdomen:  Soft, nontender, bowel sounds present  Extremities: No peripheral edema.  Neurologic: Sedated  Skin: No lesions  GU: Foley catheter in place    Basic Metabolic Panel: Recent Labs  Lab 06/12/19 0508  06/13/19 0443 06/13/19 1558 06/14/19 0423 06/15/19 0500 06/16/19 0346  NA 134*   < > 137 137 138 139 142  K 3.9   < > 3.3* 3.8 4.0 3.9 4.0  CL 90*   < > 93* 97* 99 99 102  CO2 21*   < > 28 28 30 30 27   GLUCOSE 156*   < > 142* 123* 122* 126* 101*  BUN 193*   < > 147* 104* 54* 52* 61*  CREATININE 11.89*   < > 8.56* 5.97* 3.58* 4.38* 5.07*  CALCIUM 5.3*   < > 6.0* 6.4* 7.1* 7.6* 8.2*  MG 2.5*  --  2.4  --  2.4 2.2 2.4  PHOS 9.0*   < > 6.5* 4.5 3.6 5.3* 5.8*   < > = values in this interval not displayed.    Liver Function Tests: Recent Labs  Lab 06/11/19 0421 06/12/19 0508  06/13/19 0443 06/13/19 1558 06/13/19 1803 06/14/19 0423 06/15/19 0500 06/16/19 0346  AST 327* 156*  --  85*  --   81*  --   --  81*  ALT 208* 141*  --  99*  --  93*  --   --  80*  ALKPHOS 71 64  --  55  --  59  --   --  75  BILITOT 1.4* 1.1  --  0.7  --  0.9  --   --  1.2  PROT 7.3 6.4*  --  6.0*  --  6.4*  --   --  6.5  ALBUMIN 3.4*  3.3* 2.9*   < > 2.5*  2.5* 2.7* 2.8* 2.6* 2.6* 2.9*  2.8*   < > = values in this interval not displayed.   Recent Labs  Lab 06/10/19 1907 06/16/19 0346  LIPASE 339* 74*   Recent Labs  Lab 06/10/19 2058 06/16/19 0346  AMMONIA 22 21    CBC: Recent Labs  Lab 06/11/19 0421 06/12/19 0508 06/13/19 0443 06/15/19 0500 06/16/19 0346  WBC 8.4 5.6 4.5 4.0 5.0  NEUTROABS  --  4.0 2.7 2.4  --   HGB 14.2 12.6* 11.3* 10.4* 11.8*  HCT 40.9 35.3* 32.2* 31.6* 35.5*  MCV 82.6 80.6 81.9 87.3 84.9  PLT 186 163 157 154 172  Cardiac Enzymes: Recent Labs  Lab 06/11/19 1451 06/13/19 0443 06/14/19 0423 06/15/19 0500  CKTOTAL 23,960* 10,566* 5,037* 2,424*    BNP: Invalid input(s): POCBNP  CBG: Recent Labs  Lab 06/14/19 0332 06/14/19 0734 06/14/19 1131 06/14/19 1536 06/14/19 1956  GLUCAP 131* 128* 96 91 102*    Microbiology: Results for orders placed or performed during the hospital encounter of 06/10/19  SARS Coronavirus 2 by RT PCR (hospital order, performed in [place] hospital lab)     Status: None   Collection Time: 06/10/19 10:02 PM  Result Value Ref Range Status   SARS Coronavirus 2 NEGATIVE NEGATIVE Final    Comment: (NOTE) If result is NEGATIVE SARS-CoV-2 target nucleic acids are NOT DETECTED. The SARS-CoV-2 RNA is generally detectable in upper and lower  respiratory specimens during the acute phase of infection. The lowest  concentration of SARS-CoV-2 viral copies this assay can detect is 250  copies / mL. A negative result does not preclude SARS-CoV-2 infection  and should not be used as the sole basis for treatment or other  patient management decisions.  A negative result may occur with  improper specimen collection / handling,  submission of specimen other  than nasopharyngeal swab, presence of viral mutation(s) within the  areas targeted by this assay, and inadequate number of viral copies  (<250 copies / mL). A negative result must be combined with clinical  observations, patient history, and epidemiological information. If result is POSITIVE SARS-CoV-2 target nucleic acids are DETECTED. The SARS-CoV-2 RNA is generally detectable in upper and lower  respiratory specimens dur ing the acute phase of infection.  Positive  results are indicative of active infection with SARS-CoV-2.  Clinical  correlation with patient history and other diagnostic information is  necessary to determine patient infection status.  Positive results do  not rule out bacterial infection or co-infection with other viruses. If result is PRESUMPTIVE POSTIVE SARS-CoV-2 nucleic acids MAY BE PRESENT.   A presumptive positive result was obtained on the submitted specimen  and confirmed on repeat testing.  While 2019 novel coronavirus  (SARS-CoV-2) nucleic acids may be present in the submitted sample  additional confirmatory testing may be necessary for epidemiological  and / or clinical management purposes  to differentiate between  SARS-CoV-2 and other Sarbecovirus currently known to infect humans.  If clinically indicated additional testing with an alternate test  methodology [phone]) is advised. The SARS-CoV-2 RNA is generally  detectable in upper and lower respiratory sp ecimens during the acute  phase of infection. The expected result is Negative. Fact Sheet for Patients:  StrictlyIdeas.no Fact Sheet for Healthcare Providers: BankingDealers.co.za This test is not yet approved or cleared by the [place] FDA and has been authorized for detection and/or diagnosis of SARS-CoV-2 by FDA under an Emergency Use Authorization (EUA).  This EUA will remain in effect (meaning this test can be  used) for the duration of the COVID-19 declaration under Section 564(b)(1) of the Act, 21 U.S.C. section 360bbb-3(b)(1), unless the authorization is terminated or revoked sooner. Performed at [facility], [place] [address] [place], [place] [address]   MRSA PCR Screening     Status: None   Collection Time: 06/11/19 12:30 PM   Specimen: Nasopharyngeal  Result Value Ref Range Status   MRSA by PCR NEGATIVE NEGATIVE Final    Comment:        The GeneXpert MRSA Assay (FDA approved for NASAL specimens only), is one component of a comprehensive MRSA colonization surveillance program.  It is not intended to diagnose MRSA infection nor to guide or monitor treatment for MRSA infections. Performed at [facility], [address] [place], [place] [address]   CULTURE, BLOOD (ROUTINE X 2) w Reflex to ID Panel     Status: None (Preliminary result)   Collection Time: 06/12/19  1:34 PM   Specimen: BLOOD  Result Value Ref Range Status   Specimen Description BLOOD BLOOD RIGHT HAND  Final   Special Requests   Final    BOTTLES DRAWN AEROBIC AND ANAEROBIC Blood Culture adequate volume   Culture   Final    NO GROWTH 2 DAYS Performed at [facility], [address], [place], [place] [address]    Report Status PENDING  Incomplete  CULTURE, BLOOD (ROUTINE X 2) w Reflex to ID Panel     Status: None (Preliminary result)   Collection Time: 06/12/19  1:35 PM   Specimen: BLOOD  Result Value Ref Range Status   Specimen Description BLOOD BLOOD LEFT FOREARM  Final   Special Requests   Final    BOTTLES DRAWN AEROBIC AND ANAEROBIC Blood Culture adequate volume   Culture   Final    NO GROWTH 2 DAYS Performed at [facility], [address], [place], [place] [address]    Report Status PENDING  Incomplete  Culture, respiratory (non-expectorated)     Status: None   Collection Time: 06/13/19 12:10 AM   Specimen: Tracheal Aspirate; Respiratory  Result Value Ref Range Status    Specimen Description   Final    TRACHEAL ASPIRATE Performed at [facility], [address], [place], [place] [address]    Special Requests   Final    NONE Performed at [facility], [place], [place] [address]    Gram Stain   Final    RARE WBC PRESENT, PREDOMINANTLY PMN MODERATE GRAM POSITIVE COCCI IN PAIRS IN CLUSTERS FEW GRAM NEGATIVE RODS    Culture   Final    FEW Consistent with normal respiratory flora. Performed at [facility], [place] [address], [place], [place] [address]    Report Status 06/15/2019 FINAL  Final    Coagulation Studies: No results for input(s): LABPROT, INR in the last 72 hours.  Urinalysis: No results for input(s): COLORURINE, LABSPEC, PHURINE, GLUCOSEU, HGBUR, BILIRUBINUR, KETONESUR, PROTEINUR, UROBILINOGEN, NITRITE, LEUKOCYTESUR in the last 72 hours.  Invalid input(s): APPERANCEUR    Imaging: No results found.   Medications:   . sodium chloride Stopped (06/14/19 1428)  . sodium chloride 50 mL/hr at 06/16/19 0700  . azithromycin Stopped (06/15/19 2357)  . cefTRIAXone (ROCEPHIN)  IV Stopped (06/15/19 1805)  . dexmedetomidine (PRECEDEX) IV infusion 1.2 mcg/kg/hr (06/16/19 0700)   . chlorhexidine gluconate (MEDLINE KIT)  15 mL Mouth Rinse BID  . Chlorhexidine Gluconate Cloth  6 each Topical Daily  . folic acid  1 mg Oral Daily  . heparin  5,000 Units Subcutaneous Q8H  . multivitamin with minerals  1 tablet Oral Daily  . sodium chloride flush  3 mL Intravenous Q12H  . thiamine  100 mg Oral Daily   sodium chloride, hydrALAZINE, ipratropium-albuterol, labetalol, ondansetron **OR** ondansetron (ZOFRAN) IV  Assessment/ Plan:  58 y.o. male with a PMHx of alcohol abuse and impaired cognitive function, who was admitted to [facility] on 06/10/2019 for evaluation of weakness, diarrhea, nausea, and vomiting.    1.  Acute kidney injury suspected secondary to prolonged volume depletion/rhabdomyolysis.  Patient  underwent continuous renal placement therapy  with improvement in azotemia. -Patient previously on CRRT.  BUN and creatinine have trended up off of dialysis.  Urine output was 625 cc over the preceding 24 hours.  No urgent indication for dialysis at the moment however we may need to consider reinitiation of renal placement therapy if his urine output continues to drop.  2.  Metabolic acidosis.  Resolved, serum bicarbonate currently 27.  Continue to monitor serum bicarbonate.  3.  Alcohol abuse.  Management as per hospitalist and critical care.  4.  Hypokalemia.  Potassium relatively stable at 4.0.   LOS: 6 Karmon Andis 11/7/20207:26 AM

## 2019-06-16 NOTE — Progress Notes (Addendum)
PROGRESS NOTE    Martin Diaz  STM:[id number] DOB: 22-Jun-1961 DOA: 06/10/2019 PCP: Patient, No Pcp Per      Brief Narrative:  Martin Diaz is a 58 y.o. M with a history of ETOH abuse and intellectual disability (lives in a boarding house, Aunt/Uncle guardian) admitted 11/1 with reports of several days of N/V/D, shaking and weakness.  ER evaluation notable for hemodynamic stability, labs - BUN 188, Sr Cr 11.56, Na 130, Cl 90, corrected Ca 6.8, HCO3 17, AST 450 / ALT 237, alk phos 77, lipase 339, INR 1.3, ammonia 22, ETOH negative. RUQ [place] negative for gallstones or hepatobiliary abnormality.  Treated with IVF and calcium gluconate.  Renal US negative for obstruction. Developed worsening mental status and intubated for airway protection.  Found to have rhabdomyolysis, requiring HD.        Assessment & Plan:  Acute Kidney Injury  Rhabdomyolysis  Metabolic Acidosis  Left Renal Cyst  Baseline sr cr normal.  Nephrology consulted 11/2.  Suspect AKI in setting of profound volume depletion and rhabdomyolysis (admit CK 24K). Renal US negative for hydronephrosis.   Last HD 48 hrs ago, Cr rising at rate suggesting ATN.    UOP decreasing. -Consult Nephrology, appreciate cares > plan 11/7 is to hold HD, monitor UOP closely  -Daily BMP -Strict I/Os -Follow CK   -avoid nephrotoxic agents / ensure adequate renal perfusion  -keep HD cath for now  -defer follow up regarding renal cyst to Nephrology   Pancreatitis  Lipase 339 on admit, ETOH abuse hx.  Improving, tolerating oral diet  Agitated Delirium  Suspect element of delirium superimposed on baseline intellectual disability. RR 7-9 on dexmedetomidine overnight.  Vascilates between sedate and agitated, difficult to achieve RASS 0 -Stop dexmedetomidine -HOB elevated -avoid further sedating medications as able  -when off sedation, neuro exam non-focal -PT eval  Acute Respiratory Insufficiency in setting of AMS Intubated for airway  protection in setting of AKI, rhabdo.  Extubated 11/5 -elevate HOB -minimize sedating medications -Continue clindamycin empiric abx for possible aspiration, D5/5   Hypertension  Elevated BP during admit, concern for urgency.  BP imrpoved -PRN IV labetalol, hydralazine   Hypocalcemia Corrected Ca+ 11/7 = 9.2 -monitor, replace as indicated   ETOH Abuse  Unclear baseline, active drinking? as lives in a group home -monitor for evidence of withdrawal -continue folate, MVI, thiamine    DVT prophylaxis: Heparin SQ Code Status: FULL CODE Family Communication: [name] [phone] called for update 11/7, message left for return call.     Consultants:   PCCM   Procedures:   ETT 11/3 >> 11/5   R IJ HD Cath 11/3 >>   Diagnostic Tests: Renal US 11/1 >> medical renal disease of both kidneys, exophytic indeterminate nodule lateral aspect of the upper to mid left kidney 2.2 cm (query solid or complex cystic lesion, MR imaging recommended) ETOH 11/1 >> negative  [place] ABD Limited 11/1 >> no evidence of gallstones or hepatobiliary abnormality  Antimicrobials:   Azithro 11/3 (empiric) >> 11/7  Ceftriaxone 11/3 (empiric >> 11/7   Subjective: Patient agitated overnight, required increased Precedex.  No fever, dyspnea, respiratory distress, vomiting, complaints of pain.  Objective: Vitals:   06/16/19 0800 06/16/19 0900 06/16/19 1000 06/16/19 1100  BP: (!) 171/90 (!) 157/85 (!) 162/86 (!) 167/91  Pulse: (!) 54  64 65  Resp: _0 (!) 8  Temp: 97.9 F (36.6 C)     TempSrc: Axillary     SpO2: 100%  100% 100%  Weight:      Height:        Intake/Output Summary (Last 24 hours) at 06/16/2019 1538 Last data filed at 06/16/2019 1446 Gross per 24 hour  Intake 2048.95 ml  Output 1300 ml  Net 748.95 ml   Filed Weights   06/13/19 0500 06/14/19 0500 06/15/19 0441  Weight: 96 kg 94.5 kg 93.2 kg    Examination: General appearance: Well-nourished adult male, sedated on  Precedex.   HEENT: Anicteric, conjunctiva pink, lids and lashes normal. No nasal deformity, discharge, epistaxis.  Lips moist, dentition poor.  Oropharynx dry,  Skin: Warm and dry.  No suspicious rashes or lesions. Cardiac: RRR, no murmurs appreciated.  No LE edema.    Respiratory: Normal respiratory rate and rhythm.  CTAB without rales or wheezes. Abdomen: Abdomen soft.  No tenderness palpation or grimace, no ascites or voluntary guarding.   MSK: No deformities or effusions of the large joints of the upper or lower extremities bilaterally. Neuro: Awake sedated on Precedex.  Slowly follows commands, speech halting but there.    Psych: Attention diminished, affect blunted, judgment insight appear impaired.      Data Reviewed: I have personally reviewed following labs and imaging studies:  CBC: Recent Labs  Lab 06/11/19 0421 06/12/19 0508 06/13/19 0443 06/15/19 0500 06/16/19 0346  WBC 8.4 5.6 4.5 4.0 5.0  NEUTROABS  --  4.0 2.7 2.4  --   HGB 14.2 12.6* 11.3* 10.4* 11.8*  HCT 40.9 35.3* 32.2* 31.6* 35.5*  MCV 82.6 80.6 81.9 87.3 84.9  PLT 186 163 157 154 412   Basic Metabolic Panel: Recent Labs  Lab 06/12/19 0508  06/13/19 0443 06/13/19 1558 06/14/19 0423 06/15/19 0500 06/16/19 0346  NA 134*   < > 137 137 138 139 142  K 3.9   < > 3.3* 3.8 4.0 3.9 4.0  CL 90*   < > 93* 97* 99 99 102  CO2 21*   < > _0 GLUCOSE 156*   < > 142* 123* 122* 126* 101*  BUN 193*   < > 147* 104* 54* 52* 61*  CREATININE 11.89*   < > 8.56* 5.97* 3.58* 4.38* 5.07*  CALCIUM 5.3*   < > 6.0* 6.4* 7.1* 7.6* 8.2*  MG 2.5*  --  2.4  --  2.4 2.2 2.4  PHOS 9.0*   < > 6.5* 4.5 3.6 5.3* 5.8*   < > = values in this interval not displayed.   GFR: Estimated Creatinine Clearance: 20 mL/min (A) (by C-G formula based on SCr of 5.07 mg/dL (H)). Liver Function Tests: Recent Labs  Lab 06/11/19 0421 06/12/19 0508  06/13/19 0443 06/13/19 1558 06/13/19 1803 06/14/19 0423 06/15/19 0500 06/16/19  0346  AST 327* 156*  --  85*  --  81*  --   --  81*  ALT 208* 141*  --  99*  --  93*  --   --  80*  ALKPHOS 71 64  --  55  --  59  --   --  75  BILITOT 1.4* 1.1  --  0.7  --  0.9  --   --  1.2  PROT 7.3 6.4*  --  6.0*  --  6.4*  --   --  6.5  ALBUMIN 3.4*  3.3* 2.9*   < > 2.5*  2.5* 2.7* 2.8* 2.6* 2.6* 2.9*  2.8*   < > = values in this interval not displayed.   Recent  Labs  Lab 06/10/19 1907 06/16/19 0346  LIPASE 339* 74*   Recent Labs  Lab 06/10/19 2058 06/16/19 0346  AMMONIA 22 21   Coagulation Profile: Recent Labs  Lab 06/10/19 2058 06/12/19 1138  INR 1.3* 1.3*   Cardiac Enzymes: Recent Labs  Lab 06/11/19 1451 06/13/19 0443 06/14/19 0423 06/15/19 0500  CKTOTAL 23,960* 10,566* 5,037* 2,424*   BNP (last 3 results) No results for input(s): PROBNP in the last 8760 hours. HbA1C: No results for input(s): HGBA1C in the last 72 hours. CBG: Recent Labs  Lab 06/14/19 0332 06/14/19 0734 06/14/19 1131 06/14/19 1536 06/14/19 1956  GLUCAP 131* 128* 87 91 102*   Lipid Profile: No results for input(s): CHOL, HDL, LDLCALC, TRIG, CHOLHDL, LDLDIRECT in the last 72 hours. Thyroid Function Tests: No results for input(s): TSH, T4TOTAL, FREET4, T3FREE, THYROIDAB in the last 72 hours. Anemia Panel: No results for input(s): VITAMINB12, FOLATE, FERRITIN, TIBC, IRON, RETICCTPCT in the last 72 hours. Urine analysis:    Component Value Date/Time   COLORURINE YELLOW (A) 06/11/2019 0316   APPEARANCEUR CLOUDY (A) 06/11/2019 0316   LABSPEC 1.012 06/11/2019 0316   PHURINE 5.0 06/11/2019 0316   GLUCOSEU NEGATIVE 06/11/2019 0316   HGBUR LARGE (A) 06/11/2019 0316   BILIRUBINUR NEGATIVE 06/11/2019 0316   KETONESUR NEGATIVE 06/11/2019 0316   PROTEINUR 100 (A) 06/11/2019 0316   NITRITE NEGATIVE 06/11/2019 0316   LEUKOCYTESUR SMALL (A) 06/11/2019 0316   Sepsis Labs: _0 (procalcitonin:4,lacticacidven:4)  ) Recent Results (from the past 240 hour(s))  SARS Coronavirus 2  by RT PCR (hospital order, performed in [facility] hospital lab)     Status: None   Collection Time: 06/10/19 10:02 PM  Result Value Ref Range Status   SARS Coronavirus 2 NEGATIVE NEGATIVE Final    Comment: (NOTE) If result is NEGATIVE SARS-CoV-2 target nucleic acids are NOT DETECTED. The SARS-CoV-2 RNA is generally detectable in upper and lower  respiratory specimens during the acute phase of infection. The lowest  concentration of SARS-CoV-2 viral copies this assay can detect is 250  copies / mL. A negative result does not preclude SARS-CoV-2 infection  and should not be used as the sole basis for treatment or other  patient management decisions.  A negative result may occur with  improper specimen collection / handling, submission of specimen other  than nasopharyngeal swab, presence of viral mutation(s) within the  areas targeted by this assay, and inadequate number of viral copies  (<250 copies / mL). A negative result must be combined with clinical  observations, patient history, and epidemiological information. If result is POSITIVE SARS-CoV-2 target nucleic acids are DETECTED. The SARS-CoV-2 RNA is generally detectable in upper and lower  respiratory specimens dur ing the acute phase of infection.  Positive  results are indicative of active infection with SARS-CoV-2.  Clinical  correlation with patient history and other diagnostic information is  necessary to determine patient infection status.  Positive results do  not rule out bacterial infection or co-infection with other viruses. If result is PRESUMPTIVE POSTIVE SARS-CoV-2 nucleic acids MAY BE PRESENT.   A presumptive positive result was obtained on the submitted specimen  and confirmed on repeat testing.  While 2019 novel coronavirus  (SARS-CoV-2) nucleic acids may be present in the submitted sample  additional confirmatory testing may be necessary for epidemiological  and / or clinical management purposes  to  differentiate between  SARS-CoV-2 and other Sarbecovirus currently known to infect humans.  If clinically indicated additional testing with an alternate test  methodology [phone]) is advised. The SARS-CoV-2 RNA is generally  detectable in upper and lower respiratory sp ecimens during the acute  phase of infection. The expected result is Negative. Fact Sheet for Patients:  StrictlyIdeas.no Fact Sheet for Healthcare Providers: BankingDealers.co.za This test is not yet approved or cleared by the [place] FDA and has been authorized for detection and/or diagnosis of SARS-CoV-2 by FDA under an Emergency Use Authorization (EUA).  This EUA will remain in effect (meaning this test can be used) for the duration of the COVID-19 declaration under Section 564(b)(1) of the Act, 21 U.S.C. section 360bbb-3(b)(1), unless the authorization is terminated or revoked sooner. Performed at [facility], [place] [address] [place], [place] [address]   MRSA PCR Screening     Status: None   Collection Time: 06/11/19 12:30 PM   Specimen: Nasopharyngeal  Result Value Ref Range Status   MRSA by PCR NEGATIVE NEGATIVE Final    Comment:        The GeneXpert MRSA Assay (FDA approved for NASAL specimens only), is one component of a comprehensive MRSA colonization surveillance program. It is not intended to diagnose MRSA infection nor to guide or monitor treatment for MRSA infections. Performed at [facility], [address] [place], [place] [address]   CULTURE, BLOOD (ROUTINE X 2) w Reflex to ID Panel     Status: None (Preliminary result)   Collection Time: 06/12/19  1:34 PM   Specimen: BLOOD  Result Value Ref Range Status   Specimen Description BLOOD BLOOD RIGHT HAND  Final   Special Requests   Final    BOTTLES DRAWN AEROBIC AND ANAEROBIC Blood Culture adequate volume   Culture   Final    NO GROWTH 2 DAYS Performed at [facility], [address] [place], [place] [address]    Report Status PENDING  Incomplete  CULTURE, BLOOD (ROUTINE X 2) w Reflex to ID Panel     Status: None (Preliminary result)   Collection Time: 06/12/19  1:35 PM   Specimen: BLOOD  Result Value Ref Range Status   Specimen Description BLOOD BLOOD LEFT FOREARM  Final   Special Requests   Final    BOTTLES DRAWN AEROBIC AND ANAEROBIC Blood Culture adequate volume   Culture   Final    NO GROWTH 2 DAYS Performed at [facility], [address] [place], [place] [address]    Report Status PENDING  Incomplete  Culture, respiratory (non-expectorated)     Status: None   Collection Time: 06/13/19 12:10 AM   Specimen: Tracheal Aspirate; Respiratory  Result Value Ref Range Status   Specimen Description   Final    TRACHEAL ASPIRATE Performed at [facility], [address] [place], [place] [address]    Special Requests   Final    NONE Performed at [facility], [place], [place] [address]    Gram Stain   Final    RARE WBC PRESENT, PREDOMINANTLY PMN MODERATE GRAM POSITIVE COCCI IN PAIRS IN CLUSTERS FEW GRAM NEGATIVE RODS    Culture   Final    FEW Consistent with normal respiratory flora. Performed at [facility], [place] [address] [place], [place] [address]    Report Status 06/15/2019 FINAL  Final         Radiology Studies: No results found.      Scheduled Meds: . chlorhexidine gluconate (MEDLINE KIT)  15 mL Mouth Rinse BID  . Chlorhexidine Gluconate Cloth  6 each Topical Daily  . folic acid  1 mg Oral Daily  . heparin  5,000 Units Subcutaneous Q8H  . multivitamin with minerals  1 tablet Oral Daily  . sodium chloride flush  3 mL Intravenous Q12H  . thiamine  100 mg Oral Daily   Continuous Infusions: . sodium chloride Stopped (06/14/19 1428)  . sodium chloride 50 mL/hr at 06/16/19 0700  . azithromycin Stopped (06/15/19 2357)  . cefTRIAXone (ROCEPHIN)  IV Stopped  (06/15/19 1805)  . dexmedetomidine (PRECEDEX) IV infusion 0.6 mcg/kg/hr (06/16/19 1426)     LOS: 6 days    Time spent: 25 minutes   Noe Gens, AG-ACNP-[facility] 06/16/2019, 3:38 PM     Please page through [place]:  [email or web address] Password [email or web address] If 7PM-7AM, please contact night-coverage     Attending MD Note:  I have seen and examined the patient with NP student and agree with the note above which has been edited to reflect our agreed upon history, exam, and assessment/plan.    I have personally reviewed the orders for the patient, which were made under my direction.        [place]

## 2019-06-16 NOTE — Evaluation (Signed)
Clinical/Bedside Swallow Evaluation Patient Details  Name: Martin Diaz MRN: [id number] Date of Birth: 17-Jun-1961  Today's Date: 06/16/2019 Time: SLP Start Time (ACUTE ONLY): 73 SLP Stop Time (ACUTE ONLY): 1300 SLP Time Calculation (min) (ACUTE ONLY): 55 min  Past Medical History:  Past Medical History:  Diagnosis Date  . Alcohol use    Past Surgical History: History reviewed. No pertinent surgical history. HPI:  Pt is a 58 y.o. M with a history of ETOH abuse and intellectual disability (lives in a boarding house, Aunt/Uncle guardian) admitted 11/1 with reports of several days of N/V/D, shaking and weakness. Developed worsening mental status and intubated for airway protection.  Found to have rhabdomyolysis, requiring HD. Pt was orally extubated on 11/5/202 and tolerating well per MD notes. Pt was extubated yesterday. He is min confused and still requires a sitter in the room with him. Pt admitted w/ Metabolic/uremic encephalopathy.   Assessment / Plan / Recommendation Clinical Impression  In spite of recent oral intubation and medications given for agitation still currently, pt appears to present w/ adequate oropharyngeal phase swallowing function w/ No immediate, overt, clinical s/s of aspiration noted during po trials at bedside. Pt remains min inattentive and requires few verbal cues for follow through w/ tasks and has Baseline Cognitive decline which can increase risk for aspiration. Following general aspiration precautions, risk for aspiration appears reduced. During po trials, No decline in vocal quality or respiratory effort noted - O2 sats remained 98-99%. Pt fed self holding the cup and took 1-2 sips at a time monitored by SLP to not overfill his mouth - educated on taking small, single sips slowly w/ rest breaks. During the oral phase, pt exhibited min lengthy mastication time/effort and exhibited more gumming/mashing w/ increased textured foods d/t Cognitive status, medication  potentially. Given time, pt cleared orally. He benefited from broken down solids moistened well for easier mastication. OM exam revealed no unilateral weakness; appeared [facility]. Pt helped to feed self but needed support d/t mentation. Recommend a Dysphagia level 2 (minced foods) diet w/ Purees; Thin liquids. Recommend strict aspiration precautions and monitoring during meals at this time d/t Cognitive status; Pills Crushed in Puree for safer swallowing at this time. Assistance at meals for monitoring, support. Recommend upgrade of diet to solid foods when pt's overall medical status has improved.  SLP Visit Diagnosis: Dysphagia, oral phase (R13.11)(declined Cognitive awareness; baseline status)    Aspiration Risk  Mild aspiration risk;Risk for inadequate nutrition/hydration(reduced following precs)    Diet Recommendation  Dysphagia level 2 (minced foods moistened well) w/ Thin liquids; general aspiration and Reflux precautions. Supervision at meals d/t Cognitive decline, illness.  Medication Administration: Crushed with puree(for safer swallowing)    Other  Recommendations Recommended Consults: (Dietician f/u) Oral Care Recommendations: Oral care BID;Staff/trained caregiver to provide oral care Other Recommendations: (n/a)   Follow up Recommendations None(TBD)      Frequency and Duration min 2x/week  1 week       Prognosis Prognosis for Safe Diet Advancement: Good Barriers to Reach Goals: Cognitive deficits;Time post onset;Severity of deficits      Swallow Study   General Date of Onset: 06/11/19 HPI: Pt is a 58 y.o. M with a history of ETOH abuse and intellectual disability (lives in a boarding house, Aunt/Uncle guardian) admitted 11/1 with reports of several days of N/V/D, shaking and weakness. Developed worsening mental status and intubated for airway protection.  Found to have rhabdomyolysis, requiring HD. Pt was orally extubated on 11/5/202 and tolerating well  per MD notes. Pt was  extubated yesterday. He is min confused and still requires a sitter in the room with him. Pt admitted w/ Metabolic/uremic encephalopathy. Type of Study: Bedside Swallow Evaluation Previous Swallow Assessment: none reported Diet Prior to this Study: NPO Temperature Spikes Noted: No(wbc 5.0) Respiratory Status: Room air History of Recent Intubation: Yes Length of Intubations (days): 3 days Date extubated: 06/14/19 Behavior/Cognition: Cooperative;Pleasant mood;Confused;Distractible;Requires cueing(Awake; baseline Cognitive impairment per H/P) Oral Cavity Assessment: Within Functional Limits(recently had min liquids to drink w/ NSG) Oral Care Completed by SLP: Yes Oral Cavity - Dentition: Adequate natural dentition;Missing dentition(few) Vision: Functional for self-feeding Self-Feeding Abilities: Able to feed self;Needs assist;Needs set up;Total assist(confused) Patient Positioning: Upright in bed(needed positioning) Baseline Vocal Quality: Low vocal intensity(mumbled/muttered speech) Volitional Cough: Cognitively unable to elicit Volitional Swallow: Unable to elicit    Oral/Motor/Sensory Function Overall Oral Motor/Sensory Function: Within functional limits(grossly; No unilateral weakness noted)   Ice Chips Ice chips: Within functional limits Presentation: Spoon(fed; 4 trials)   Thin Liquid Thin Liquid: Within functional limits Presentation: Self Fed;Cup;Straw(supported by SLP; 3 trials via cup; ~3 ozs via straw) Other Comments: inattentive at times requiring verbal cues    Nectar Thick Nectar Thick Liquid: Not tested   Honey Thick Honey Thick Liquid: Not tested   Puree Puree: Within functional limits Presentation: Spoon(fed; 6 trials - fair+) Other Comments: min slow to clear intermittently   Solid     Solid: Impaired Presentation: Spoon(fed; 4 trials) Oral Phase Impairments: Poor awareness of bolus;Impaired mastication(slow to clear) Oral Phase Functional Implications: Prolonged  oral transit;Impaired mastication Pharyngeal Phase Impairments: (no overt clinical s/s ) Other Comments: slow, munching mastication       Martin Kenner, MS, CCC-SLP Watson,Katherine 06/16/2019,1:39 PM

## 2019-06-17 LAB — CULTURE, BLOOD (ROUTINE X 2)
Culture: NO GROWTH
Culture: NO GROWTH
Special Requests: ADEQUATE
Special Requests: ADEQUATE

## 2019-06-17 LAB — RENAL FUNCTION PANEL
Albumin: 2.8 g/dL — ABNORMAL LOW (ref 3.5–5.0)
Anion gap: 10 (ref 5–15)
BUN: 72 mg/dL — ABNORMAL HIGH (ref 6–20)
CO2: 25 mmol/L (ref 22–32)
Calcium: 8.1 mg/dL — ABNORMAL LOW (ref 8.9–10.3)
Chloride: 105 mmol/L (ref 98–111)
Creatinine, Ser: 4.62 mg/dL — ABNORMAL HIGH (ref 0.61–1.24)
GFR calc Af Amer: 15 mL/min — ABNORMAL LOW (ref >60)
GFR calc non Af Amer: 13 mL/min — ABNORMAL LOW (ref >60)
Glucose, Bld: 148 mg/dL — ABNORMAL HIGH (ref 70–99)
Phosphorus: 5.3 mg/dL — ABNORMAL HIGH (ref 2.5–4.6)
Potassium: 3.6 mmol/L (ref 3.5–5.1)
Sodium: 140 mmol/L (ref 135–145)

## 2019-06-17 LAB — HEPATIC FUNCTION PANEL
ALT: 72 U/L — ABNORMAL HIGH (ref 0–44)
AST: 67 U/L — ABNORMAL HIGH (ref 15–41)
Albumin: 2.7 g/dL — ABNORMAL LOW (ref 3.5–5.0)
Alkaline Phosphatase: 75 U/L (ref 38–126)
Bilirubin, Direct: 0.2 mg/dL (ref 0.0–0.2)
Indirect Bilirubin: 0.5 mg/dL (ref 0.3–0.9)
Total Bilirubin: 0.7 mg/dL (ref 0.3–1.2)
Total Protein: 6.1 g/dL — ABNORMAL LOW (ref 6.5–8.1)

## 2019-06-17 LAB — MAGNESIUM: Magnesium: 2.1 mg/dL (ref 1.7–2.4)

## 2019-06-17 MED ORDER — ALPRAZOLAM 0.5 MG PO TABS
1.0000 mg | ORAL_TABLET | Freq: Three times a day (TID) | ORAL | Status: DC | PRN
  Administered 2019-06-17 – 2019-06-27 (×7): 1 mg via ORAL
  Filled 2019-06-17 (×7): qty 2

## 2019-06-17 MED ORDER — CLONIDINE HCL 0.2 MG/24HR TD PTWK
0.2000 mg | MEDICATED_PATCH | TRANSDERMAL | Status: DC
  Administered 2019-06-17 – 2019-06-24 (×2): 0.2 mg via TRANSDERMAL
  Filled 2019-06-17 (×2): qty 1

## 2019-06-17 NOTE — Progress Notes (Signed)
[facility]  ROUNDING NOTE   Subjective:  Patient remains in the critical care unit. Creatinine currently down to 4.62 and urine output was 2.6 L over the preceding 24 hours. Patient still on sedation but is arousable.   Objective:  Vital signs in last 24 hours:  Temp:  [97.6 F (36.4 C)-98.8 F (37.1 C)] 97.6 F (36.4 C) (11/08 0800) Pulse Rate:  [62-110] 69 (11/08 1100) Resp:  [10-22] 10 (11/08 1100) BP: (138-192)/(73-100) 138/80 (11/08 1100) SpO2:  [97 %-100 %] 98 % (11/08 1100) Weight:  [94.9 kg] 94.9 kg (11/08 0409)  Weight change:  Filed Weights   06/14/19 0500 06/15/19 0441 06/17/19 0409  Weight: 94.5 kg 93.2 kg 94.9 kg    Intake/Output: I/O last 3 completed shifts: In: 2630.6 [P.O.:120; I.V.:2082.4; IV Piggyback:428.2] Out: 2600 [Urine:2600]   Intake/Output this shift:  Total I/O In: 335.3 [P.O.:240; I.V.:95.3] Out: 400 [Urine:400]  Physical Exam: General: Critically ill-appearing  Head: Normocephalic atraumatic  Eyes: Anicteric  Neck: Supple, trachea midline  Lungs:  Clear to auscultation, normal effrt  Heart: S1S2 no rub  Abdomen:  Soft, nontender, bowel sounds present  Extremities: No peripheral edema.  Neurologic: Sedated, arousable  Skin: No lesions  GU: Foley catheter in place    Basic Metabolic Panel: Recent Labs  Lab 06/13/19 0443 06/13/19 1558 06/14/19 0423 06/15/19 0500 06/16/19 0346 06/17/19 0450  NA 137 137 138 139 142 140  K 3.3* 3.8 4.0 3.9 4.0 3.6  CL 93* 97* 99 99 102 105  CO2 28 28 30 30 27 25   GLUCOSE 142* 123* 122* 126* 101* 148*  BUN 147* 104* 54* 52* 61* 72*  CREATININE 8.56* 5.97* 3.58* 4.38* 5.07* 4.62*  CALCIUM 6.0* 6.4* 7.1* 7.6* 8.2* 8.1*  MG 2.4  --  2.4 2.2 2.4 2.1  PHOS 6.5* 4.5 3.6 5.3* 5.8* 5.3*    Liver Function Tests: Recent Labs  Lab 06/12/19 0508  06/13/19 0443  06/13/19 1803 06/14/19 0423 06/15/19 0500 06/16/19 0346 06/17/19 0450  AST 156*  --  85*  --  81*  --   --  81* 67*   ALT 141*  --  99*  --  93*  --   --  80* 72*  ALKPHOS 64  --  55  --  59  --   --  75 75  BILITOT 1.1  --  0.7  --  0.9  --   --  1.2 0.7  PROT 6.4*  --  6.0*  --  6.4*  --   --  6.5 6.1*  ALBUMIN 2.9*   < > 2.5*  2.5*   < > 2.8* 2.6* 2.6* 2.9*  2.8* 2.7*  2.8*   < > = values in this interval not displayed.   Recent Labs  Lab 06/10/19 1907 06/16/19 0346  LIPASE 339* 74*   Recent Labs  Lab 06/10/19 2058 06/16/19 0346  AMMONIA 22 21    CBC: Recent Labs  Lab 06/11/19 0421 06/12/19 0508 06/13/19 0443 06/15/19 0500 06/16/19 0346  WBC 8.4 5.6 4.5 4.0 5.0  NEUTROABS  --  4.0 2.7 2.4  --   HGB 14.2 12.6* 11.3* 10.4* 11.8*  HCT 40.9 35.3* 32.2* 31.6* 35.5*  MCV 82.6 80.6 81.9 87.3 84.9  PLT 186 163 157 154 172    Cardiac Enzymes: Recent Labs  Lab 06/11/19 1451 06/13/19 0443 06/14/19 0423 06/15/19 0500  CKTOTAL 23,960* 10,566* 5,037* 2,424*    BNP: Invalid input(s): POCBNP  CBG: Recent Labs  Lab 06/14/19 0332 06/14/19 0734 06/14/19 1131 06/14/19 1536 06/14/19 1956  GLUCAP 131* 128* 87 91 102*    Microbiology: Results for orders placed or performed during the hospital encounter of 06/10/19  SARS Coronavirus 2 by RT PCR (hospital order, performed in [place] hospital lab)     Status: None   Collection Time: 06/10/19 10:02 PM  Result Value Ref Range Status   SARS Coronavirus 2 NEGATIVE NEGATIVE Final    Comment: (NOTE) If result is NEGATIVE SARS-CoV-2 target nucleic acids are NOT DETECTED. The SARS-CoV-2 RNA is generally detectable in upper and lower  respiratory specimens during the acute phase of infection. The lowest  concentration of SARS-CoV-2 viral copies this assay can detect is 250  copies / mL. A negative result does not preclude SARS-CoV-2 infection  and should not be used as the sole basis for treatment or other  patient management decisions.  A negative result may occur with  improper specimen collection / handling, submission of  specimen other  than nasopharyngeal swab, presence of viral mutation(s) within the  areas targeted by this assay, and inadequate number of viral copies  (<250 copies / mL). A negative result must be combined with clinical  observations, patient history, and epidemiological information. If result is POSITIVE SARS-CoV-2 target nucleic acids are DETECTED. The SARS-CoV-2 RNA is generally detectable in upper and lower  respiratory specimens dur ing the acute phase of infection.  Positive  results are indicative of active infection with SARS-CoV-2.  Clinical  correlation with patient history and other diagnostic information is  necessary to determine patient infection status.  Positive results do  not rule out bacterial infection or co-infection with other viruses. If result is PRESUMPTIVE POSTIVE SARS-CoV-2 nucleic acids MAY BE PRESENT.   A presumptive positive result was obtained on the submitted specimen  and confirmed on repeat testing.  While 2019 novel coronavirus  (SARS-CoV-2) nucleic acids may be present in the submitted sample  additional confirmatory testing may be necessary for epidemiological  and / or clinical management purposes  to differentiate between  SARS-CoV-2 and other Sarbecovirus currently known to infect humans.  If clinically indicated additional testing with an alternate test  methodology [phone]) is advised. The SARS-CoV-2 RNA is generally  detectable in upper and lower respiratory sp ecimens during the acute  phase of infection. The expected result is Negative. Fact Sheet for Patients:  StrictlyIdeas.no Fact Sheet for Healthcare Providers: BankingDealers.co.za This test is not yet approved or cleared by the [place] FDA and has been authorized for detection and/or diagnosis of SARS-CoV-2 by FDA under an Emergency Use Authorization (EUA).  This EUA will remain in effect (meaning this test can be used) for the  duration of the COVID-19 declaration under Section 564(b)(1) of the Act, 21 U.S.C. section 360bbb-3(b)(1), unless the authorization is terminated or revoked sooner. Performed at [facility], [place] [address] [place], [place] [address]   MRSA PCR Screening     Status: None   Collection Time: 06/11/19 12:30 PM   Specimen: Nasopharyngeal  Result Value Ref Range Status   MRSA by PCR NEGATIVE NEGATIVE Final    Comment:        The GeneXpert MRSA Assay (FDA approved for NASAL specimens only), is one component of a comprehensive MRSA colonization surveillance program. It is not intended to diagnose MRSA infection nor to guide or monitor treatment for MRSA infections. Performed at [facility], [address], [place], [place] [address]   CULTURE,  BLOOD (ROUTINE X 2) w Reflex to ID Panel     Status: None   Collection Time: 06/12/19  1:34 PM   Specimen: BLOOD  Result Value Ref Range Status   Specimen Description BLOOD BLOOD RIGHT HAND  Final   Special Requests   Final    BOTTLES DRAWN AEROBIC AND ANAEROBIC Blood Culture adequate volume   Culture   Final    NO GROWTH 5 DAYS Performed at [facility], [address] [place], [place] [address]    Report Status 06/17/2019 FINAL  Final  CULTURE, BLOOD (ROUTINE X 2) w Reflex to ID Panel     Status: None   Collection Time: 06/12/19  1:35 PM   Specimen: BLOOD  Result Value Ref Range Status   Specimen Description BLOOD BLOOD LEFT FOREARM  Final   Special Requests   Final    BOTTLES DRAWN AEROBIC AND ANAEROBIC Blood Culture adequate volume   Culture   Final    NO GROWTH 5 DAYS Performed at [facility], [address], [place], [place] [address]    Report Status 06/17/2019 FINAL  Final  Culture, respiratory (non-expectorated)     Status: None   Collection Time: 06/13/19 12:10 AM   Specimen: Tracheal Aspirate; Respiratory  Result Value Ref Range Status   Specimen Description   Final    TRACHEAL  ASPIRATE Performed at [facility], [address], [place], [place] [address]    Special Requests   Final    NONE Performed at [facility], [place], [place] [address]    Gram Stain   Final    RARE WBC PRESENT, PREDOMINANTLY PMN MODERATE GRAM POSITIVE COCCI IN PAIRS IN CLUSTERS FEW GRAM NEGATIVE RODS    Culture   Final    FEW Consistent with normal respiratory flora. Performed at [facility], [place] [address], [place], [place] [address]    Report Status 06/15/2019 FINAL  Final    Coagulation Studies: No results for input(s): LABPROT, INR in the last 72 hours.  Urinalysis: No results for input(s): COLORURINE, LABSPEC, PHURINE, GLUCOSEU, HGBUR, BILIRUBINUR, KETONESUR, PROTEINUR, UROBILINOGEN, NITRITE, LEUKOCYTESUR in the last 72 hours.  Invalid input(s): APPERANCEUR    Imaging: No results found.   Medications:   . sodium chloride Stopped (06/14/19 1428)  . sodium chloride 50 mL/hr at 06/16/19 0700  . dexmedetomidine (PRECEDEX) IV infusion 1 mcg/kg/hr (06/17/19 1000)   . chlorhexidine gluconate (MEDLINE KIT)  15 mL Mouth Rinse BID  . Chlorhexidine Gluconate Cloth  6 each Topical Daily  . cloNIDine  0.2 mg Transdermal Weekly  . folic acid  1 mg Oral Daily  . heparin  5,000 Units Subcutaneous Q8H  . multivitamin with minerals  1 tablet Oral Daily  . sodium chloride flush  3 mL Intravenous Q12H  . thiamine  100 mg Oral Daily   sodium chloride, hydrALAZINE, ipratropium-albuterol, labetalol, ondansetron **OR** ondansetron (ZOFRAN) IV  Assessment/ Plan:  58 y.o. male with a PMHx of alcohol abuse and impaired cognitive function, who was admitted to [facility] on 06/10/2019 for evaluation of weakness, diarrhea, nausea, and vomiting.    1.  Acute kidney injury suspected secondary to prolonged volume depletion/rhabdomyolysis.  Patient underwent continuous renal placement therapy with improvement in azotemia. -Patient still has considerable  renal dysfunction with a BUN of 72 and creatinine of 4.62 however he is making adequate amounts of urine and urine output was 2.6 L over the preceding 24 hours.  He was  previously on CRRT.  No further indication for dialysis at the moment.  Continue to monitor renal parameters and urine output closely.  2.  Metabolic acidosis.  Patient off of bicarbonate drip.  Serum bicarbonate currently 25.  3.  Alcohol abuse.  Management as per hospitalist and critical care.  4.  Hypokalemia.  Resolved at the moment, potassium 3.6.  Continue to periodically monitor.   LOS: 7 Averill Winters 11/8/202012:29 PM

## 2019-06-17 NOTE — Progress Notes (Signed)
CRITICAL CARE NOTE  CC  follow up respiratory failure  SUBJECTIVE Comfortably sitting up sleeping on precedex drip. Pt has a sitter in the room with him.   BP 138/80   Pulse 69   Temp 97.6 F (36.4 C) (Oral)   Resp 10   Ht 6\' 4"  (1.93 m)   Wt 94.9 kg   SpO2 98%   BMI 25.47 kg/m    I/O last 3 completed shifts: In: 2630.6 [P.O.:120; I.V.:2082.4; IV Piggyback:428.2] Out: 2600 [Urine:2600] Total I/O In: 335.3 [P.O.:240; I.V.:95.3] Out: 400 [Urine:400]  SpO2: 98 % O2 Flow Rate (L/min): 2 L/min FiO2 (%): 21 %   SIGNIFICANT EVENTS 11/3: Intubated, central line placed in IJ. CRRT started  11/4: Remains intubated at FiO2 of 21%. Pt received calcium replacement and is on sodium bicarbonate infusion. Pt is sedated with propofol. Also started on rocephin and azithromycin for abnormal lung sounds and elevated procalcitonin level.  11/5 -pt s/p weaning trial with liberation from MV, will stop CVVH and remove trialysis 11/6: Pt extubated. He is confused. Creatinine is worsening (4.38 today). Pt was on bicarb drip and switched to NS today per nephrology recommendations.  11/7 -optimizing for downgrade, may require IHD , intermittent Precedex requirement due to his severe agitation 11/8 - transitioning to po regimen with clonidine patch for hyperactive aggitation.   REVIEW OF SYSTEMS - Patient cannot answer questions due to baseline cognitive impairment  PHYSICAL EXAMINATION:  GENERAL: pt is awake and sitting up, but is confused. Breathing comfortably on room air HEAD: Normocephalic, atraumatic.  EYES: Pupils equal, round, reactive to light.  No scleral icterus.  MOUTH: Moist mucosal membrane. NECK: Supple.  PULMONARY: +rhonchi  CARDIOVASCULAR: S1 and S2. Regular rate and rhythm. No murmurs, rubs, or gallops.  GASTROINTESTINAL: Soft, nontender, -distended. No masses. Positive bowel sounds. No hepatosplenomegaly.  MUSCULOSKELETAL: No swelling, clubbing, or edema.  NEUROLOGIC:  awake, confused AMS SKIN:intact,warm,dry  MEDICATIONS: I have reviewed all medications and confirmed regimen as documented   CULTURE RESULTS   Recent Results (from the past 240 hour(s))  SARS Coronavirus 2 by RT PCR (hospital order, performed in [place] hospital lab)     Status: None   Collection Time: 06/10/19 10:02 PM  Result Value Ref Range Status   SARS Coronavirus 2 NEGATIVE NEGATIVE Final    Comment: (NOTE) If result is NEGATIVE SARS-CoV-2 target nucleic acids are NOT DETECTED. The SARS-CoV-2 RNA is generally detectable in upper and lower  respiratory specimens during the acute phase of infection. The lowest  concentration of SARS-CoV-2 viral copies this assay can detect is 250  copies / mL. A negative result does not preclude SARS-CoV-2 infection  and should not be used as the sole basis for treatment or other  patient management decisions.  A negative result may occur with  improper specimen collection / handling, submission of specimen other  than nasopharyngeal swab, presence of viral mutation(s) within the  areas targeted by this assay, and inadequate number of viral copies  (<250 copies / mL). A negative result must be combined with clinical  observations, patient history, and epidemiological information. If result is POSITIVE SARS-CoV-2 target nucleic acids are DETECTED. The SARS-CoV-2 RNA is generally detectable in upper and lower  respiratory specimens dur ing the acute phase of infection.  Positive  results are indicative of active infection with SARS-CoV-2.  Clinical  correlation with patient history and other diagnostic information is  necessary to determine patient infection status.  Positive results do  not rule out  bacterial infection or co-infection with other viruses. If result is PRESUMPTIVE POSTIVE SARS-CoV-2 nucleic acids MAY BE PRESENT.   A presumptive positive result was obtained on the submitted specimen  and confirmed on repeat testing.  While  2019 novel coronavirus  (SARS-CoV-2) nucleic acids may be present in the submitted sample  additional confirmatory testing may be necessary for epidemiological  and / or clinical management purposes  to differentiate between  SARS-CoV-2 and other Sarbecovirus currently known to infect humans.  If clinically indicated additional testing with an alternate test  methodology [phone]) is advised. The SARS-CoV-2 RNA is generally  detectable in upper and lower respiratory sp ecimens during the acute  phase of infection. The expected result is Negative. Fact Sheet for Patients:  StrictlyIdeas.no Fact Sheet for Healthcare Providers: BankingDealers.co.za This test is not yet approved or cleared by the [place] FDA and has been authorized for detection and/or diagnosis of SARS-CoV-2 by FDA under an Emergency Use Authorization (EUA).  This EUA will remain in effect (meaning this test can be used) for the duration of the COVID-19 declaration under Section 564(b)(1) of the Act, 21 U.S.C. section 360bbb-3(b)(1), unless the authorization is terminated or revoked sooner. Performed at [facility], [place] [address], [place], [place] [address]   MRSA PCR Screening     Status: None   Collection Time: 06/11/19 12:30 PM   Specimen: Nasopharyngeal  Result Value Ref Range Status   MRSA by PCR NEGATIVE NEGATIVE Final    Comment:        The GeneXpert MRSA Assay (FDA approved for NASAL specimens only), is one component of a comprehensive MRSA colonization surveillance program. It is not intended to diagnose MRSA infection nor to guide or monitor treatment for MRSA infections. Performed at [facility], [address] [place], [place] [address]   CULTURE, BLOOD (ROUTINE X 2) w Reflex to ID Panel     Status: None   Collection Time: 06/12/19  1:34 PM   Specimen: BLOOD  Result Value Ref Range Status   Specimen Description BLOOD BLOOD  RIGHT HAND  Final   Special Requests   Final    BOTTLES DRAWN AEROBIC AND ANAEROBIC Blood Culture adequate volume   Culture   Final    NO GROWTH 5 DAYS Performed at [facility], [address] [place], [place] [address]    Report Status 06/17/2019 FINAL  Final  CULTURE, BLOOD (ROUTINE X 2) w Reflex to ID Panel     Status: None   Collection Time: 06/12/19  1:35 PM   Specimen: BLOOD  Result Value Ref Range Status   Specimen Description BLOOD BLOOD LEFT FOREARM  Final   Special Requests   Final    BOTTLES DRAWN AEROBIC AND ANAEROBIC Blood Culture adequate volume   Culture   Final    NO GROWTH 5 DAYS Performed at [facility], [address], [place], [place] [address]    Report Status 06/17/2019 FINAL  Final  Culture, respiratory (non-expectorated)     Status: None   Collection Time: 06/13/19 12:10 AM   Specimen: Tracheal Aspirate; Respiratory  Result Value Ref Range Status   Specimen Description   Final    TRACHEAL ASPIRATE Performed at [facility], [address], [place], [place] [address]    Special Requests   Final    NONE Performed at [facility], [address] [place], [place] [address]    Gram Stain   Final  RARE WBC PRESENT, PREDOMINANTLY PMN MODERATE GRAM POSITIVE COCCI IN PAIRS IN CLUSTERS FEW GRAM NEGATIVE RODS    Culture   Final    FEW Consistent with normal respiratory flora. Performed at [facility], [place] [address], [place], [place] [address]    Report Status 06/15/2019 FINAL  Final          IMAGING    No results found.   CBC    Component Value Date/Time   WBC 5.0 06/16/2019 0346   RBC 4.18 (L) 06/16/2019 0346   HGB 11.8 (L) 06/16/2019 0346   HCT 35.5 (L) 06/16/2019 0346   PLT 172 06/16/2019 0346   MCV 84.9 06/16/2019 0346   MCH 28.2 06/16/2019 0346   MCHC 33.2 06/16/2019 0346   RDW 12.1 06/16/2019 0346   LYMPHSABS 0.8 06/15/2019 0500   MONOABS 0.6 06/15/2019 0500   EOSABS 0.1  06/15/2019 0500   BASOSABS 0.0 06/15/2019 0500   CMP Latest Ref Rng & Units 06/17/2019 06/16/2019 06/15/2019  Glucose 70 - 99 mg/dL 148(H) 101(H) 126(H)  BUN 6 - 20 mg/dL 72(H) 61(H) 52(H)  Creatinine 0.61 - 1.24 mg/dL 4.62(H) 5.07(H) 4.38(H)  Sodium 135 - 145 mmol/L 140 142 139  Potassium 3.5 - 5.1 mmol/L 3.6 4.0 3.9  Chloride 98 - 111 mmol/L 105 102 99  CO2 22 - 32 mmol/L 25 27 30   Calcium 8.9 - 10.3 mg/dL 8.1(L) 8.2(L) 7.6(L)  Total Protein 6.5 - 8.1 g/dL 6.1(L) 6.5 -  Total Bilirubin 0.3 - 1.2 mg/dL 0.7 1.2 -  Alkaline Phos 38 - 126 U/L 75 75 -  AST 15 - 41 U/L 67(H) 81(H) -  ALT 0 - 44 U/L 72(H) 80(H) -      Indwelling Urinary Catheter continued, requirement due to   Reason to continue Indwelling Urinary Catheter strict Intake/Output monitoring for hemodynamic instability   Central Line/ continued, requirement due to  Reason to continue [place] of central venous pressure or other hemodynamic parameters and poor IV access        ASSESSMENT AND PLAN SYNOPSIS  Altered mental status  -Pt is confused s/p extubation, but pt is cognitively impaired at baseline -Recurrent agitation requiring frequent reorientation, sitters and redosing of sedation to keep to a RASS 0  Renal Failure-stage IV  -Creatinine worsening, monitor -nephrology on case possibly needs IHD again -Continue hydration with NS -Likely due to rhabdomyolysis -Nephrology on case-appreciate input -follow chem 7 -follow UO -continue Foley Catheter-assess need daily   Rhabdomyolysis -Pt currently on NS infusion -CK improved today (2424) -Nephrology on case-appreciate input  ID -continue IV abx as prescribed - pt is on no abx at this time -follow up cultures  GI/Nutrition GI PROPHYLAXIS as indicated DIET--> restricted salt diet  Constipation protocol as indicated  ENDO - ICU hypoglycemic\Hyperglycemia protocol -check FSBS per protocol   ELECTROLYTES -follow labs as  needed -phosphorous elevated at 5.3 - monitor levels -replace as needed  -pharmacy consultation   DVT/GI PRX ordered -SCDs  TRANSFUSIONS AS NEEDED MONITOR FSBS ASSESS the need for LABS as needed    Critical care provider statement:  Critical care time (minutes):32 Critical care time was exclusive of: Separately billable procedures and treating other patients Critical care was necessary to treat or prevent imminent or life-threatening deterioration of the following conditions:Altered mental status with lethargy, metabolic encephalopathy, rhabdomyolysis, stage IV acute kidney failure, multiple comorbid conditions Critical care was time spent personally by me on the following activities: Development of treatment plan with  patient or surrogate, discussions with consultants, evaluation of patient's response to treatment, examination of patient, obtaining history from patient or surrogate, ordering and performing treatments and interventions, ordering and review of laboratory studies and re-evaluation of patient's condition. I assumed direction of critical care for this patient from another provider in my specialty: no   This document was prepared using Dragon voice recognition software and may include unintentional dictation errors.    Ottie Glazier, M.D.  Division of Pulmonary &Critical [place]

## 2019-06-17 NOTE — Progress Notes (Signed)
eLink Physician-Brief Progress Note Patient Name: Martin Diaz DOB: 12/04/1960 MRN: [id number]   Date of Service  06/17/2019  HPI/Events of Note  Asked to change patient status from ICU to stepdown to address nurse staffing issues.   eICU Interventions  Will change patient status to stepdown.      Intervention Category Major Interventions: Other:  Lysle Dingwall 06/17/2019, 8:46 PM

## 2019-06-17 NOTE — Progress Notes (Addendum)
PROGRESS NOTE    Martin Diaz  BUL:[id number] DOB: 08-21-1960 DOA: 06/10/2019 PCP: Patient, No Pcp Per      Brief Narrative:  Martin Diaz is a 58 y.o. M with a history of ETOH abuse and intellectual disability (lives in a boarding house, Aunt/Uncle guardian) admitted 11/1 with reports of several days of N/V/D, shaking and weakness.    In the ER, Cr 11.56, BUN 188.  HCO3 17, AST 450 / ALT 237, alk phos 77, lipase 339. RUQ [place] negative for gallstones or hepatobiliary abnormality.  Started IV fluids for rhabdo, AKI.  Renal US negative for obstruction. Developed worsening mental status and intubated for airway protection.         Assessment & Plan:  Acute Kidney Injury  Rhabdomyolysis  Metabolic Acidosis  Left Renal Cyst  Baseline sr cr normal.  Nephrology consulted 11/2.  Suspect AKI in setting of profound volume depletion and rhabdomyolysis (admit CK 24K). Renal US negative for hydronephrosis.   Last HD 11/5, UOP excellent 2.6L in last 24 hours.  -Consult Nephrology, appreciate input > consider removal of HD cath 11/9 if trend continues to improve -Daily BMP -Strict I's and O's -avoid nephrotoxic agents, ensure adequate renal perfusion  -defer follow up of renal cyst to Nephrology      Agitated Delirium  Suspect element of delirium superimposed on baseline intellectual disability. Required precedex infusion in ICU.  Impulsive at times, difficult to re-direct.  -Precedex per CCM -They have started clonidine  Delirium precautions:   -Lights and TV off, minimize interruptions at night  -Blinds open and lights on during day  -Glasses/hearing aid with patient  -Frequent reorientation  -PT/OT when able  -Avoid sedation medications/Beers list medications    Acute Respiratory Insufficiency in setting of AMS Possible aspiration pneumonia Intubated for airway protection in setting of AKI, rhabdo.  Extubated 11/5.  Completed his antibiotics.  No further fever. -aspiration  precautions   Hypertension  BP normal -Continue PRN IV labetalol, hydralazine for SBP >170   Hypocalcemia Corrected Ca+ 11/8 = 9.1 -monitor, replace as indicated    ETOH Abuse  Unclear baseline, I am skeptical he was actively drinking as lives in a group home -Continue folate, thiamine, MVI  Pancreatitis  Lipase 339 on admit, ETOH abuse hx. resolved.  No pain with food.       DVT prophylaxis: Heparin Code Status: FULL CODE Family Communication: [name] [phone] called for update 11/7, message left for return call.  Patient's uncle - [name] at [phone] called 11/8, no answer, message left for return call.  Have not been able to reach family via phone.  Have been unable to reach family by phone for several days in a row    Consultants:   PCCM   Procedures:   ETT 11/3 >> 11/5   R IJ HD Cath 11/3 >>   Diagnostic Tests: Renal US 11/1 >> medical renal disease of both kidneys, exophytic indeterminate nodule lateral aspect of the upper to mid left kidney 2.2 cm (query solid or complex cystic lesion, MR imaging recommended) ETOH 11/1 >> negative  [place] ABD Limited 11/1 >> no evidence of gallstones or hepatobiliary abnormality  Antimicrobials:   Azithro 11/3 (empiric) >> 11/7  Ceftriaxone 11/3 (empiric >> 11/7   Subjective: Able to sit up and eat breakfast while on Precedex today.  No respiratory distress, no confusion, no fever     Objective: Vitals:   06/17/19 1100 06/17/19 1200 06/17/19 1300 06/17/19 1400  BP:  138/80 (!) 147/92  (!) 180/84  Pulse: 69 80 94 87  Resp: 10 20 15    Temp:      TempSrc:      SpO2: 98% 98% 94% 100%  Weight:      Height:        Intake/Output Summary (Last 24 hours) at 06/17/2019 1645 Last data filed at 06/17/2019 1600 Gross per 24 hour  Intake 878.84 ml  Output 2750 ml  Net -1871.16 ml   Filed Weights   06/14/19 0500 06/15/19 0441 06/17/19 0409  Weight: 94.5 kg 93.2 kg 94.9 kg    Examination: General  appearance: Well-nourished adult male, sedated on Precedex, in no acute distress   HEENT: Anicteric, conjunctiva pink, lids and lashes normal. No nasal deformity, discharge, epistaxis.  Lips moist, teeth normal. OP moist, no oral lesions.   Skin: Warm and dry.  No suspicious rashes or lesions. Cardiac: RRR, no murmurs appreciated.  No LE edema.    Respiratory: Normal respiratory rate and rhythm.  CTAB without rales or wheezes. Abdomen: Abdomen soft.  No grimace to palpation. No ascites, distension, hepatosplenomegaly.   MSK: No deformities or effusions of the large joints of the upper or lower extremities bilaterally. Neuro: Sedated, does not arouse to sternal rub.    Psych: Does not follow commands.    Data Reviewed: I have personally reviewed following labs and imaging studies:  CBC: Recent Labs  Lab 06/11/19 0421 06/12/19 0508 06/13/19 0443 06/15/19 0500 06/16/19 0346  WBC 8.4 5.6 4.5 4.0 5.0  NEUTROABS  --  4.0 2.7 2.4  --   HGB 14.2 12.6* 11.3* 10.4* 11.8*  HCT 40.9 35.3* 32.2* 31.6* 35.5*  MCV 82.6 80.6 81.9 87.3 84.9  PLT 186 163 157 154 242   Basic Metabolic Panel: Recent Labs  Lab 06/13/19 0443 06/13/19 1558 06/14/19 0423 06/15/19 0500 06/16/19 0346 06/17/19 0450  NA 137 137 138 139 142 140  K 3.3* 3.8 4.0 3.9 4.0 3.6  CL 93* 97* 99 99 102 105  CO2 28 28 30 30 27 25   GLUCOSE 142* 123* 122* 126* 101* 148*  BUN 147* 104* 54* 52* 61* 72*  CREATININE 8.56* 5.97* 3.58* 4.38* 5.07* 4.62*  CALCIUM 6.0* 6.4* 7.1* 7.6* 8.2* 8.1*  MG 2.4  --  2.4 2.2 2.4 2.1  PHOS 6.5* 4.5 3.6 5.3* 5.8* 5.3*   GFR: Estimated Creatinine Clearance: 21.9 mL/min (A) (by C-G formula based on SCr of 4.62 mg/dL (H)). Liver Function Tests: Recent Labs  Lab 06/12/19 0508  06/13/19 0443  06/13/19 1803 06/14/19 0423 06/15/19 0500 06/16/19 0346 06/17/19 0450  AST 156*  --  85*  --  81*  --   --  81* 67*  ALT 141*  --  99*  --  93*  --   --  80* 72*  ALKPHOS 64  --  55  --  59  --    --  75 75  BILITOT 1.1  --  0.7  --  0.9  --   --  1.2 0.7  PROT 6.4*  --  6.0*  --  6.4*  --   --  6.5 6.1*  ALBUMIN 2.9*   < > 2.5*  2.5*   < > 2.8* 2.6* 2.6* 2.9*  2.8* 2.7*  2.8*   < > = values in this interval not displayed.   Recent Labs  Lab 06/10/19 1907 06/16/19 0346  LIPASE 339* 74*   Recent Labs  Lab 06/10/19 2058 06/16/19 0346  AMMONIA 22 21   Coagulation Profile: Recent Labs  Lab 06/10/19 2058 06/12/19 1138  INR 1.3* 1.3*   Cardiac Enzymes: Recent Labs  Lab 06/11/19 1451 06/13/19 0443 06/14/19 0423 06/15/19 0500  CKTOTAL 23,960* 10,566* 5,037* 2,424*   BNP (last 3 results) No results for input(s): PROBNP in the last 8760 hours. HbA1C: No results for input(s): HGBA1C in the last 72 hours. CBG: Recent Labs  Lab 06/14/19 0332 06/14/19 0734 06/14/19 1131 06/14/19 1536 06/14/19 1956  GLUCAP 131* 128* 87 91 102*   Lipid Profile: No results for input(s): CHOL, HDL, LDLCALC, TRIG, CHOLHDL, LDLDIRECT in the last 72 hours. Thyroid Function Tests: No results for input(s): TSH, T4TOTAL, FREET4, T3FREE, THYROIDAB in the last 72 hours. Anemia Panel: No results for input(s): VITAMINB12, FOLATE, FERRITIN, TIBC, IRON, RETICCTPCT in the last 72 hours. Urine analysis:    Component Value Date/Time   COLORURINE YELLOW (A) 06/11/2019 0316   APPEARANCEUR CLOUDY (A) 06/11/2019 0316   LABSPEC 1.012 06/11/2019 0316   PHURINE 5.0 06/11/2019 0316   GLUCOSEU NEGATIVE 06/11/2019 0316   HGBUR LARGE (A) 06/11/2019 0316   BILIRUBINUR NEGATIVE 06/11/2019 0316   KETONESUR NEGATIVE 06/11/2019 0316   PROTEINUR 100 (A) 06/11/2019 0316   NITRITE NEGATIVE 06/11/2019 0316   LEUKOCYTESUR SMALL (A) 06/11/2019 0316   Sepsis Labs: '@LABRCNTIP'$ (procalcitonin:4,lacticacidven:4)  ) Recent Results (from the past 240 hour(s))  SARS Coronavirus 2 by RT PCR (hospital order, performed in [facility] hospital lab)     Status: None   Collection Time: 06/10/19 10:02 PM  Result Value  Ref Range Status   SARS Coronavirus 2 NEGATIVE NEGATIVE Final    Comment: (NOTE) If result is NEGATIVE SARS-CoV-2 target nucleic acids are NOT DETECTED. The SARS-CoV-2 RNA is generally detectable in upper and lower  respiratory specimens during the acute phase of infection. The lowest  concentration of SARS-CoV-2 viral copies this assay can detect is 250  copies / mL. A negative result does not preclude SARS-CoV-2 infection  and should not be used as the sole basis for treatment or other  patient management decisions.  A negative result may occur with  improper specimen collection / handling, submission of specimen other  than nasopharyngeal swab, presence of viral mutation(s) within the  areas targeted by this assay, and inadequate number of viral copies  (<250 copies / mL). A negative result must be combined with clinical  observations, patient history, and epidemiological information. If result is POSITIVE SARS-CoV-2 target nucleic acids are DETECTED. The SARS-CoV-2 RNA is generally detectable in upper and lower  respiratory specimens dur ing the acute phase of infection.  Positive  results are indicative of active infection with SARS-CoV-2.  Clinical  correlation with patient history and other diagnostic information is  necessary to determine patient infection status.  Positive results do  not rule out bacterial infection or co-infection with other viruses. If result is PRESUMPTIVE POSTIVE SARS-CoV-2 nucleic acids MAY BE PRESENT.   A presumptive positive result was obtained on the submitted specimen  and confirmed on repeat testing.  While 2019 novel coronavirus  (SARS-CoV-2) nucleic acids may be present in the submitted sample  additional confirmatory testing may be necessary for epidemiological  and / or clinical management purposes  to differentiate between  SARS-CoV-2 and other Sarbecovirus currently known to infect humans.  If clinically indicated additional testing with an  alternate test  methodology [phone]) is advised. The SARS-CoV-2 RNA is generally  detectable in upper and lower respiratory sp ecimens during the acute  phase of infection. The expected result is Negative. Fact Sheet for Patients:  StrictlyIdeas.no Fact Sheet for Healthcare Providers: BankingDealers.co.za This test is not yet approved or cleared by the [place] FDA and has been authorized for detection and/or diagnosis of SARS-CoV-2 by FDA under an Emergency Use Authorization (EUA).  This EUA will remain in effect (meaning this test can be used) for the duration of the COVID-19 declaration under Section 564(b)(1) of the Act, 21 U.S.C. section 360bbb-3(b)(1), unless the authorization is terminated or revoked sooner. Performed at [facility], [place] [address] [place], [place] [address]   MRSA PCR Screening     Status: None   Collection Time: 06/11/19 12:30 PM   Specimen: Nasopharyngeal  Result Value Ref Range Status   MRSA by PCR NEGATIVE NEGATIVE Final    Comment:        The GeneXpert MRSA Assay (FDA approved for NASAL specimens only), is one component of a comprehensive MRSA colonization surveillance program. It is not intended to diagnose MRSA infection nor to guide or monitor treatment for MRSA infections. Performed at [facility], [address] [place], [place] [address]   CULTURE, BLOOD (ROUTINE X 2) w Reflex to ID Panel     Status: None   Collection Time: 06/12/19  1:34 PM   Specimen: BLOOD  Result Value Ref Range Status   Specimen Description BLOOD BLOOD RIGHT HAND  Final   Special Requests   Final    BOTTLES DRAWN AEROBIC AND ANAEROBIC Blood Culture adequate volume   Culture   Final    NO GROWTH 5 DAYS Performed at [facility], [address] [place], [place] [address]    Report Status 06/17/2019 FINAL  Final  CULTURE, BLOOD (ROUTINE X 2) w Reflex to ID Panel     Status: None    Collection Time: 06/12/19  1:35 PM   Specimen: BLOOD  Result Value Ref Range Status   Specimen Description BLOOD BLOOD LEFT FOREARM  Final   Special Requests   Final    BOTTLES DRAWN AEROBIC AND ANAEROBIC Blood Culture adequate volume   Culture   Final    NO GROWTH 5 DAYS Performed at [facility], [address], [place], [place] [address]    Report Status 06/17/2019 FINAL  Final  Culture, respiratory (non-expectorated)     Status: None   Collection Time: 06/13/19 12:10 AM   Specimen: Tracheal Aspirate; Respiratory  Result Value Ref Range Status   Specimen Description   Final    TRACHEAL ASPIRATE Performed at [facility], [address] [place], [place] [address]    Special Requests   Final    NONE Performed at [facility], [place], [place] [address]    Gram Stain   Final    RARE WBC PRESENT, PREDOMINANTLY PMN MODERATE GRAM POSITIVE COCCI IN PAIRS IN CLUSTERS FEW GRAM NEGATIVE RODS    Culture   Final    FEW Consistent with normal respiratory flora. Performed at [facility], [place] [address], [place], [place] [address]    Report Status 06/15/2019 FINAL  Final         Radiology Studies: No results found.      Scheduled Meds: . chlorhexidine gluconate (MEDLINE KIT)  15 mL Mouth Rinse BID  . Chlorhexidine Gluconate Cloth  6 each Topical Daily  . cloNIDine  0.2 mg Transdermal Weekly  . folic acid  1 mg Oral Daily  . heparin  5,000 Units Subcutaneous Q8H  . multivitamin with minerals  1 tablet Oral Daily  . sodium chloride flush  3 mL Intravenous Q12H  . thiamine  100 mg Oral Daily   Continuous Infusions: . sodium chloride Stopped (06/14/19 1428)  . sodium chloride 50 mL/hr at 06/16/19 0700  . dexmedetomidine (PRECEDEX) IV infusion Stopped (06/17/19 1157)     LOS: 7 days    Time spent: 25 minutes  Noe Gens, AG-ACNP-S [facility] 06/17/2019, 4:45 PM      Please page through [place]:  [email or web address] Password [email or web address] If 7PM-7AM, please contact night-coverage     Attending MD Note:  I have seen and examined the patient with NP student and agree with the note above which has been edited to reflect our agreed upon history, exam, and assessment/plan.    I have personally reviewed the orders for the patient, which were made under my direction.        [place]

## 2019-06-18 LAB — CBC
HCT: 28.1 % — ABNORMAL LOW (ref 39.0–52.0)
Hemoglobin: 9.3 g/dL — ABNORMAL LOW (ref 13.0–17.0)
MCH: 28.3 pg (ref 26.0–34.0)
MCHC: 33.1 g/dL (ref 30.0–36.0)
MCV: 85.4 fL (ref 80.0–100.0)
Platelets: 175 10*3/uL (ref 150–400)
RBC: 3.29 MIL/uL — ABNORMAL LOW (ref 4.22–5.81)
RDW: 12.5 % (ref 11.5–15.5)
WBC: 6.9 10*3/uL (ref 4.0–10.5)
nRBC: 0 % (ref 0.0–0.2)

## 2019-06-18 LAB — RENAL FUNCTION PANEL
Albumin: 2.6 g/dL — ABNORMAL LOW (ref 3.5–5.0)
Anion gap: 9 (ref 5–15)
BUN: 65 mg/dL — ABNORMAL HIGH (ref 6–20)
CO2: 26 mmol/L (ref 22–32)
Calcium: 8.2 mg/dL — ABNORMAL LOW (ref 8.9–10.3)
Chloride: 104 mmol/L (ref 98–111)
Creatinine, Ser: 4.25 mg/dL — ABNORMAL HIGH (ref 0.61–1.24)
GFR calc Af Amer: 17 mL/min — ABNORMAL LOW (ref >60)
GFR calc non Af Amer: 15 mL/min — ABNORMAL LOW (ref >60)
Glucose, Bld: 106 mg/dL — ABNORMAL HIGH (ref 70–99)
Phosphorus: 5.6 mg/dL — ABNORMAL HIGH (ref 2.5–4.6)
Potassium: 3.6 mmol/L (ref 3.5–5.1)
Sodium: 139 mmol/L (ref 135–145)

## 2019-06-18 LAB — MAGNESIUM: Magnesium: 2.1 mg/dL (ref 1.7–2.4)

## 2019-06-18 NOTE — Progress Notes (Signed)
Patient was weaned own off the precedex dripp and givien xanax. Oral fluids offered. However, the patient woke up and became very aggressive and agitated trying to remove IJ and foley. Mitten were placed but increased patient's agitation. Precedex turned back on. 1:1 sitter remains at bedside. Will continue to monitor and endorse.

## 2019-06-18 NOTE — Care Plan (Signed)
Following peripherally.  No overt issues with Precedex.  Patient comfortable.  Continue to wean Precedex off as tolerated.  No hemodynamic or respiratory instability.  Renold Don, MD [facility]

## 2019-06-18 NOTE — Progress Notes (Signed)
[facility]  ROUNDING NOTE   Subjective:   On sedation.   UOP 2800 NS - stopped   Objective:  Vital signs in last 24 hours:  Temp:  [97.9 F (36.6 C)-99.8 F (37.7 C)] 99.8 F (37.7 C) (11/09 0731) Pulse Rate:  [69-102] 71 (11/09 1000) Resp:  [10-31] 16 (11/09 1000) BP: (138-180)/(71-92) 151/85 (11/09 1000) SpO2:  [94 %-100 %] 98 % (11/09 1000) Weight:  [96 kg] 96 kg (11/09 0500)  Weight change: 1.1 kg Filed Weights   06/15/19 0441 06/17/19 0409 06/18/19 0500  Weight: 93.2 kg 94.9 kg 96 kg    Intake/Output: I/O last 3 completed shifts: In: 1584.5 [P.O.:480; I.V.:1104.5] Out: 4400 [Urine:4400]   Intake/Output this shift:  Total I/O In: -  Out: 250 [Urine:250]  Physical Exam: General: Critically ill   Head: Normocephalic atraumatic  Eyes: Anicteric  Neck: Supple, trachea midline  Lungs:  Clear to auscultation   Heart: regular  Abdomen:  Soft, nontender, bowel sounds present  Extremities: No peripheral edema.  Neurologic: Sedated   Skin: No lesions  GU: Foley catheter     Basic Metabolic Panel: Recent Labs  Lab 06/13/19 0443  06/14/19 0423 06/15/19 0500 06/16/19 0346 06/17/19 0450 06/18/19 0940  NA 137   < > 138 139 142 140 139  K 3.3*   < > 4.0 3.9 4.0 3.6 3.6  CL 93*   < > 99 99 102 105 104  CO2 28   < > 30 30 27 25 26   GLUCOSE 142*   < > 122* 126* 101* 148* 106*  BUN 147*   < > 54* 52* 61* 72* 65*  CREATININE 8.56*   < > 3.58* 4.38* 5.07* 4.62* 4.25*  CALCIUM 6.0*   < > 7.1* 7.6* 8.2* 8.1* 8.2*  MG 2.4  --  2.4 2.2 2.4 2.1  --   PHOS 6.5*   < > 3.6 5.3* 5.8* 5.3* 5.6*   < > = values in this interval not displayed.    Liver Function Tests: Recent Labs  Lab 06/12/19 0508  06/13/19 0443  06/13/19 1803 06/14/19 0423 06/15/19 0500 06/16/19 0346 06/17/19 0450 06/18/19 0940  AST 156*  --  85*  --  81*  --   --  81* 67*  --   ALT 141*  --  99*  --  93*  --   --  80* 72*  --   ALKPHOS 64  --  55  --  59  --   --  75 75  --    BILITOT 1.1  --  0.7  --  0.9  --   --  1.2 0.7  --   PROT 6.4*  --  6.0*  --  6.4*  --   --  6.5 6.1*  --   ALBUMIN 2.9*   < > 2.5*  2.5*   < > 2.8* 2.6* 2.6* 2.9*  2.8* 2.7*  2.8* 2.6*   < > = values in this interval not displayed.   Recent Labs  Lab 06/16/19 0346  LIPASE 74*   Recent Labs  Lab 06/16/19 0346  AMMONIA 21    CBC: Recent Labs  Lab 06/12/19 0508 06/13/19 0443 06/15/19 0500 06/16/19 0346 06/18/19 0940  WBC 5.6 4.5 4.0 5.0 6.9  NEUTROABS 4.0 2.7 2.4  --   --   HGB 12.6* 11.3* 10.4* 11.8* 9.3*  HCT 35.3* 32.2* 31.6* 35.5* 28.1*  MCV 80.6 81.9 87.3 84.9 85.4  PLT 163  157 154 172 175    Cardiac Enzymes: Recent Labs  Lab 06/11/19 1451 06/13/19 0443 06/14/19 0423 06/15/19 0500  CKTOTAL 23,960* 10,566* 5,037* 2,424*    BNP: Invalid input(s): POCBNP  CBG: Recent Labs  Lab 06/14/19 0332 06/14/19 0734 06/14/19 1131 06/14/19 1536 06/14/19 1956  GLUCAP 131* 128* 33 91 102*    Microbiology: Results for orders placed or performed during the hospital encounter of 06/10/19  SARS Coronavirus 2 by RT PCR (hospital order, performed in [facility] hospital lab)     Status: None   Collection Time: 06/10/19 10:02 PM  Result Value Ref Range Status   SARS Coronavirus 2 NEGATIVE NEGATIVE Final    Comment: (NOTE) If result is NEGATIVE SARS-CoV-2 target nucleic acids are NOT DETECTED. The SARS-CoV-2 RNA is generally detectable in upper and lower  respiratory specimens during the acute phase of infection. The lowest  concentration of SARS-CoV-2 viral copies this assay can detect is 250  copies / mL. A negative result does not preclude SARS-CoV-2 infection  and should not be used as the sole basis for treatment or other  patient management decisions.  A negative result may occur with  improper specimen collection / handling, submission of specimen other  than nasopharyngeal swab, presence of viral mutation(s) within the  areas targeted by this assay, and  inadequate number of viral copies  (<250 copies / mL). A negative result must be combined with clinical  observations, patient history, and epidemiological information. If result is POSITIVE SARS-CoV-2 target nucleic acids are DETECTED. The SARS-CoV-2 RNA is generally detectable in upper and lower  respiratory specimens dur ing the acute phase of infection.  Positive  results are indicative of active infection with SARS-CoV-2.  Clinical  correlation with patient history and other diagnostic information is  necessary to determine patient infection status.  Positive results do  not rule out bacterial infection or co-infection with other viruses. If result is PRESUMPTIVE POSTIVE SARS-CoV-2 nucleic acids MAY BE PRESENT.   A presumptive positive result was obtained on the submitted specimen  and confirmed on repeat testing.  While 2019 novel coronavirus  (SARS-CoV-2) nucleic acids may be present in the submitted sample  additional confirmatory testing may be necessary for epidemiological  and / or clinical management purposes  to differentiate between  SARS-CoV-2 and other Sarbecovirus currently known to infect humans.  If clinically indicated additional testing with an alternate test  methodology [phone]) is advised. The SARS-CoV-2 RNA is generally  detectable in upper and lower respiratory sp ecimens during the acute  phase of infection. The expected result is Negative. Fact Sheet for Patients:  StrictlyIdeas.no Fact Sheet for Healthcare Providers: BankingDealers.co.za This test is not yet approved or cleared by the [place] FDA and has been authorized for detection and/or diagnosis of SARS-CoV-2 by FDA under an Emergency Use Authorization (EUA).  This EUA will remain in effect (meaning this test can be used) for the duration of the COVID-19 declaration under Section 564(b)(1) of the Act, 21 U.S.C. section 360bbb-3(b)(1), unless the  authorization is terminated or revoked sooner. Performed at [facility], [place] [address], [place], [place] [address]   MRSA PCR Screening     Status: None   Collection Time: 06/11/19 12:30 PM   Specimen: Nasopharyngeal  Result Value Ref Range Status   MRSA by PCR NEGATIVE NEGATIVE Final    Comment:        The GeneXpert MRSA Assay (FDA approved for NASAL specimens only), is one component  of a comprehensive MRSA colonization surveillance program. It is not intended to diagnose MRSA infection nor to guide or monitor treatment for MRSA infections. Performed at [facility], [address] [place], [place] [address]   CULTURE, BLOOD (ROUTINE X 2) w Reflex to ID Panel     Status: None   Collection Time: 06/12/19  1:34 PM   Specimen: BLOOD  Result Value Ref Range Status   Specimen Description BLOOD BLOOD RIGHT HAND  Final   Special Requests   Final    BOTTLES DRAWN AEROBIC AND ANAEROBIC Blood Culture adequate volume   Culture   Final    NO GROWTH 5 DAYS Performed at [facility], [address] [place], [place] [address]    Report Status 06/17/2019 FINAL  Final  CULTURE, BLOOD (ROUTINE X 2) w Reflex to ID Panel     Status: None   Collection Time: 06/12/19  1:35 PM   Specimen: BLOOD  Result Value Ref Range Status   Specimen Description BLOOD BLOOD LEFT FOREARM  Final   Special Requests   Final    BOTTLES DRAWN AEROBIC AND ANAEROBIC Blood Culture adequate volume   Culture   Final    NO GROWTH 5 DAYS Performed at [facility], [address] [place], [place] [address]    Report Status 06/17/2019 FINAL  Final  Culture, respiratory (non-expectorated)     Status: None   Collection Time: 06/13/19 12:10 AM   Specimen: Tracheal Aspirate; Respiratory  Result Value Ref Range Status   Specimen Description   Final    TRACHEAL ASPIRATE Performed at [facility], [address] [place], [place] [address]    Special Requests   Final     NONE Performed at [facility], [place], [place] [address]    Gram Stain   Final    RARE WBC PRESENT, PREDOMINANTLY PMN MODERATE GRAM POSITIVE COCCI IN PAIRS IN CLUSTERS FEW GRAM NEGATIVE RODS    Culture   Final    FEW Consistent with normal respiratory flora. Performed at [facility], [place] [address], [place], [place] [address]    Report Status 06/15/2019 FINAL  Final    Coagulation Studies: No results for input(s): LABPROT, INR in the last 72 hours.  Urinalysis: No results for input(s): COLORURINE, LABSPEC, PHURINE, GLUCOSEU, HGBUR, BILIRUBINUR, KETONESUR, PROTEINUR, UROBILINOGEN, NITRITE, LEUKOCYTESUR in the last 72 hours.  Invalid input(s): APPERANCEUR    Imaging: No results found.   Medications:   . sodium chloride 10 mL/hr at 06/17/19 2000  . sodium chloride 50 mL/hr at 06/17/19 2320  . dexmedetomidine (PRECEDEX) IV infusion 0.7 mcg/kg/hr (06/18/19 0958)   . Chlorhexidine Gluconate Cloth  6 each Topical Daily  . cloNIDine  0.2 mg Transdermal Weekly  . folic acid  1 mg Oral Daily  . heparin  5,000 Units Subcutaneous Q8H  . multivitamin with minerals  1 tablet Oral Daily  . sodium chloride flush  3 mL Intravenous Q12H  . thiamine  100 mg Oral Daily   sodium chloride, ALPRAZolam, hydrALAZINE, ipratropium-albuterol, labetalol, ondansetron **OR** ondansetron (ZOFRAN) IV  Assessment/ Plan:  58 y.o.black male with alcohol abuse and impaired cognitive function, who was admitted to [facility] on 06/10/2019 for evaluation of weakness, diarrhea, nausea, and vomiting.    1.  Acute kidney injury suspected secondary to prolonged volume depletion (prerenal azotemia) and rhabdomyolysis.   Baseline creatinine of 0.78 on 01/2018 Patient required continuous renal placement therapy  - nonoliguric urine  output.  - No indication for dialysis  2.  Metabolic acidosis.  Patient off of bicarbonate drip.     3.  Alcohol abuse.  Management as per hospitalist  and critical care.  4.  Hypokalemia: improved off bicarb infusion   LOS: 8 Martin Diaz 11/9/202010:20 AM

## 2019-06-18 NOTE — Progress Notes (Addendum)
PROGRESS NOTE    Anchor Dwan  [id number] DOB: 05-31-1961 DOA: 06/10/2019 PCP: Patient, No Pcp Per      Brief Narrative:  Martin Diaz is a 58 y.o. M with a history of ETOH abuse and intellectual disability (lives in a boarding house, Aunt/Uncle guardian) admitted 11/1 with reports of several days of N/V/D, shaking and weakness.    In the ER, Cr 11.56, BUN 188.  HCO3 17, AST 450 / ALT 237, alk phos 77, lipase 339. RUQ [place] negative for gallstones or hepatobiliary abnormality.  Started IV fluids for rhabdo, AKI.  Renal US negative for obstruction. Developed worsening mental status and intubated for airway protection.         Assessment & Plan:  Acute Kidney Injury  Rhabdomyolysis  Metabolic Acidosis  Left Renal Cyst  Baseline sr cr normal.  Nephrology consulted 11/2.  Suspect AKI in setting of profound volume depletion and rhabdomyolysis (admit CK 24K). Renal US negative for hydronephrosis. Required HD.  Last HD 11/5. Improving.   No dialysis in the last 4 days, urine output 2.8 in the last 24 hours, creatinine improving.  -Consult nephrology, appreciate expert cares  -consider removal of HD cathter > await Nephrology approval  -Daily BMP and strict I/O -avoid nephrotoxic agents, ensure adequate renal perfusion  -defer follow up of renal cyst to Nephrology   Agitated Delirium  Suspect element of delirium superimposed on baseline intellectual disability. Required precedex infusion in ICU.  Impulsive at times, difficult to re-direct. HD catheter need remains confounding factor.  -Wean Precedex per CCM -Continue clonidine for synergistic effect with Precedex and BP control -promote sleep / wake cycle, lights on during day, ensure glasses /hearing aides in place, frequent re-orientation, avoid sedating medications as able  -frequent reorientation   Acute Respiratory Insufficiency in setting of AMS Possible aspiration pneumonia Intubated for airway protection in setting  of AKI, rhabdo.  Extubated 11/5.  Completed his antibiotics.  No further fever. -aspiration precautions   Hypertension  Blood pressure elevated -Continue clonidine -Continue as needed IV labetalol, hydralazine for SBP >170  Hypocalcemia Corrected Ca+ 11/8 = 9.1 -follow Ca+, replace as indicated   ETOH Abuse  Unclear baseline, skeptical he was actively drinking as lives in a group home -Continue folate, MVI, thiamine   Pancreatitis  Lipase 339 on admit, ETOH abuse hx. Resolved.  No pain with food.       DVT prophylaxis: Heparin Code Status: FULL CODE Family Communication: [name] [phone] called for update 11/7, message left for return call.  Patient's uncle - [name] at [phone] called 11/8, no answer, message left for return call.  Have not been able to reach family via phone.  Have been unable to reach family by phone for several days in a row.     Medical decision making The below labs and imaging reports reviewed and summarized above.  Medication management as above.   The patient was admitted with renal failure causing acute embolic encephalopathy.  He required intubation and CRRT, but his renal function is improved, he has now been stable for several days after dialysis.  However he remains quite encephalopathic, unable to participate in self-cares, and requiring Precedex in order to safely manage his renal failure.  Hopefully will be able to wean off Precedex in the next 24 hours, and transfer him out of the ICU, and begin physical therapy.      Consultants:   PCCM   Procedures:   ETT 11/3 >> 11/5  R IJ HD Cath 11/3 >>   Diagnostic Tests: Renal US 11/1 >> medical renal disease of both kidneys, exophytic indeterminate nodule lateral aspect of the upper to mid left kidney 2.2 cm (query solid or complex cystic lesion, MR imaging recommended) ETOH 11/1 >> negative  [place] ABD Limited 11/1 >> no evidence of gallstones or hepatobiliary  abnormality  Antimicrobials:   Azithro 11/3 (empiric) >> 11/7  Ceftriaxone 11/3 (empiric >> 11/7   Subjective: Precedex weaned off early am, pt given xanax.  Later developed agitation and precedex restarted. RN reports he woke to eat breakfast.  Afebrile.  I/O with 2.8L UOP in last 24 hours (cr improved).     Objective: Vitals:   06/18/19 1500 06/18/19 1600 06/18/19 1700 06/18/19 1800  BP: (!) 169/83 (!) 171/85    Pulse:  69 83 80  Resp: 12 14 (!) 21 16  Temp:  97.8 F (36.6 C)    TempSrc:  Axillary    SpO2:  100% 98% (!) 86%  Weight:      Height:        Intake/Output Summary (Last 24 hours) at 06/18/2019 1904 Last data filed at 06/18/2019 1805 Gross per 24 hour  Intake 1194.59 ml  Output 2950 ml  Net -1755.41 ml   Filed Weights   06/15/19 0441 06/17/19 0409 06/18/19 0500  Weight: 93.2 kg 94.9 kg 96 kg    Examination: General appearance: Well-nourished adult male, lying in bed, densely sedated HEENT:   Skin: Skin warm and dry, no suspicious rashes or lesions Cardiac: RRR, no murmurs appreciated.  JVP normal no LE edema.    Respiratory: Respirations slow.  CTAB without rales or wheezes. Abdomen: Abdomen soft.  No tenderness to palpation, grimace, or rigidity. No ascites, distension, hepatosplenomegaly.   MSK: No deformities or effusions of the large joints of the upper or lower extremities bilaterally. Neuro: He is densely sedated, he opens his eyes to sternal rub, says one-word answers, then falls back asleep, he does not follow commands.  Pupils equal, reactive. Psych: Densely sedated.      Data Reviewed: I have personally reviewed following labs and imaging studies:  CBC: Recent Labs  Lab 06/12/19 0508 06/13/19 0443 06/15/19 0500 06/16/19 0346 06/18/19 0940  WBC 5.6 4.5 4.0 5.0 6.9  NEUTROABS 4.0 2.7 2.4  --   --   HGB 12.6* 11.3* 10.4* 11.8* 9.3*  HCT 35.3* 32.2* 31.6* 35.5* 28.1*  MCV 80.6 81.9 87.3 84.9 85.4  PLT 163 157 154 172 707   Basic  Metabolic Panel: Recent Labs  Lab 06/14/19 0423 06/15/19 0500 06/16/19 0346 06/17/19 0450 06/18/19 0940  NA 138 139 142 140 139  K 4.0 3.9 4.0 3.6 3.6  CL 99 99 102 105 104  CO2 30 30 27 25 26   GLUCOSE 122* 126* 101* 148* 106*  BUN 54* 52* 61* 72* 65*  CREATININE 3.58* 4.38* 5.07* 4.62* 4.25*  CALCIUM 7.1* 7.6* 8.2* 8.1* 8.2*  MG 2.4 2.2 2.4 2.1 2.1  PHOS 3.6 5.3* 5.8* 5.3* 5.6*   GFR: Estimated Creatinine Clearance: 23.8 mL/min (A) (by C-G formula based on SCr of 4.25 mg/dL (H)). Liver Function Tests: Recent Labs  Lab 06/12/19 0508  06/13/19 0443  06/13/19 1803 06/14/19 0423 06/15/19 0500 06/16/19 0346 06/17/19 0450 06/18/19 0940  AST 156*  --  85*  --  81*  --   --  81* 67*  --   ALT 141*  --  99*  --  93*  --   --  80* 72*  --   ALKPHOS 64  --  55  --  59  --   --  75 75  --   BILITOT 1.1  --  0.7  --  0.9  --   --  1.2 0.7  --   PROT 6.4*  --  6.0*  --  6.4*  --   --  6.5 6.1*  --   ALBUMIN 2.9*   < > 2.5*  2.5*   < > 2.8* 2.6* 2.6* 2.9*  2.8* 2.7*  2.8* 2.6*   < > = values in this interval not displayed.   Recent Labs  Lab 06/16/19 0346  LIPASE 74*   Recent Labs  Lab 06/16/19 0346  AMMONIA 21   Coagulation Profile: Recent Labs  Lab 06/12/19 1138  INR 1.3*   Cardiac Enzymes: Recent Labs  Lab 06/13/19 0443 06/14/19 0423 06/15/19 0500  CKTOTAL 10,566* 5,037* 2,424*   BNP (last 3 results) No results for input(s): PROBNP in the last 8760 hours. HbA1C: No results for input(s): HGBA1C in the last 72 hours. CBG: Recent Labs  Lab 06/14/19 0332 06/14/19 0734 06/14/19 1131 06/14/19 1536 06/14/19 1956  GLUCAP 131* 128* 87 91 102*   Lipid Profile: No results for input(s): CHOL, HDL, LDLCALC, TRIG, CHOLHDL, LDLDIRECT in the last 72 hours. Thyroid Function Tests: No results for input(s): TSH, T4TOTAL, FREET4, T3FREE, THYROIDAB in the last 72 hours. Anemia Panel: No results for input(s): VITAMINB12, FOLATE, FERRITIN, TIBC, IRON, RETICCTPCT in  the last 72 hours. Urine analysis:    Component Value Date/Time   COLORURINE YELLOW (A) 06/11/2019 0316   APPEARANCEUR CLOUDY (A) 06/11/2019 0316   LABSPEC 1.012 06/11/2019 0316   PHURINE 5.0 06/11/2019 0316   GLUCOSEU NEGATIVE 06/11/2019 0316   HGBUR LARGE (A) 06/11/2019 0316   BILIRUBINUR NEGATIVE 06/11/2019 0316   KETONESUR NEGATIVE 06/11/2019 0316   PROTEINUR 100 (A) 06/11/2019 0316   NITRITE NEGATIVE 06/11/2019 0316   LEUKOCYTESUR SMALL (A) 06/11/2019 0316   Sepsis Labs: '@LABRCNTIP'$ (procalcitonin:4,lacticacidven:4)  ) Recent Results (from the past 240 hour(s))  SARS Coronavirus 2 by RT PCR (hospital order, performed in [facility] hospital lab)     Status: None   Collection Time: 06/10/19 10:02 PM  Result Value Ref Range Status   SARS Coronavirus 2 NEGATIVE NEGATIVE Final    Comment: (NOTE) If result is NEGATIVE SARS-CoV-2 target nucleic acids are NOT DETECTED. The SARS-CoV-2 RNA is generally detectable in upper and lower  respiratory specimens during the acute phase of infection. The lowest  concentration of SARS-CoV-2 viral copies this assay can detect is 250  copies / mL. A negative result does not preclude SARS-CoV-2 infection  and should not be used as the sole basis for treatment or other  patient management decisions.  A negative result may occur with  improper specimen collection / handling, submission of specimen other  than nasopharyngeal swab, presence of viral mutation(s) within the  areas targeted by this assay, and inadequate number of viral copies  (<250 copies / mL). A negative result must be combined with clinical  observations, patient history, and epidemiological information. If result is POSITIVE SARS-CoV-2 target nucleic acids are DETECTED. The SARS-CoV-2 RNA is generally detectable in upper and lower  respiratory specimens dur ing the acute phase of infection.  Positive  results are indicative of active infection with SARS-CoV-2.  Clinical   correlation with patient history and other diagnostic information is  necessary to determine patient infection status.  Positive results  do  not rule out bacterial infection or co-infection with other viruses. If result is PRESUMPTIVE POSTIVE SARS-CoV-2 nucleic acids MAY BE PRESENT.   A presumptive positive result was obtained on the submitted specimen  and confirmed on repeat testing.  While 2019 novel coronavirus  (SARS-CoV-2) nucleic acids may be present in the submitted sample  additional confirmatory testing may be necessary for epidemiological  and / or clinical management purposes  to differentiate between  SARS-CoV-2 and other Sarbecovirus currently known to infect humans.  If clinically indicated additional testing with an alternate test  methodology [phone]) is advised. The SARS-CoV-2 RNA is generally  detectable in upper and lower respiratory sp ecimens during the acute  phase of infection. The expected result is Negative. Fact Sheet for Patients:  StrictlyIdeas.no Fact Sheet for Healthcare Providers: BankingDealers.co.za This test is not yet approved or cleared by the [place] FDA and has been authorized for detection and/or diagnosis of SARS-CoV-2 by FDA under an Emergency Use Authorization (EUA).  This EUA will remain in effect (meaning this test can be used) for the duration of the COVID-19 declaration under Section 564(b)(1) of the Act, 21 U.S.C. section 360bbb-3(b)(1), unless the authorization is terminated or revoked sooner. Performed at [facility], [place] [address], [place], [place] [address]   MRSA PCR Screening     Status: None   Collection Time: 06/11/19 12:30 PM   Specimen: Nasopharyngeal  Result Value Ref Range Status   MRSA by PCR NEGATIVE NEGATIVE Final    Comment:        The GeneXpert MRSA Assay (FDA approved for NASAL specimens only), is one component of a comprehensive MRSA colonization  surveillance program. It is not intended to diagnose MRSA infection nor to guide or monitor treatment for MRSA infections. Performed at [facility], [address] [place], [place] [address]   CULTURE, BLOOD (ROUTINE X 2) w Reflex to ID Panel     Status: None   Collection Time: 06/12/19  1:34 PM   Specimen: BLOOD  Result Value Ref Range Status   Specimen Description BLOOD BLOOD RIGHT HAND  Final   Special Requests   Final    BOTTLES DRAWN AEROBIC AND ANAEROBIC Blood Culture adequate volume   Culture   Final    NO GROWTH 5 DAYS Performed at [facility], [address] [place], [place] [address]    Report Status 06/17/2019 FINAL  Final  CULTURE, BLOOD (ROUTINE X 2) w Reflex to ID Panel     Status: None   Collection Time: 06/12/19  1:35 PM   Specimen: BLOOD  Result Value Ref Range Status   Specimen Description BLOOD BLOOD LEFT FOREARM  Final   Special Requests   Final    BOTTLES DRAWN AEROBIC AND ANAEROBIC Blood Culture adequate volume   Culture   Final    NO GROWTH 5 DAYS Performed at [facility], [address], [place], [place] [address]    Report Status 06/17/2019 FINAL  Final  Culture, respiratory (non-expectorated)     Status: None   Collection Time: 06/13/19 12:10 AM   Specimen: Tracheal Aspirate; Respiratory  Result Value Ref Range Status   Specimen Description   Final    TRACHEAL ASPIRATE Performed at [facility], [address] [place], [place] [address]    Special Requests   Final    NONE Performed at [facility], [address] [place], [place] [address]    Gram Stain  Final    RARE WBC PRESENT, PREDOMINANTLY PMN MODERATE GRAM POSITIVE COCCI IN PAIRS IN CLUSTERS FEW GRAM NEGATIVE RODS    Culture   Final    FEW Consistent with normal respiratory flora. Performed at [facility], [place] [address] [place], [place] [address]    Report Status 06/15/2019 FINAL  Final         Radiology  Studies: No results found.      Scheduled Meds: . Chlorhexidine Gluconate Cloth  6 each Topical Daily  . cloNIDine  0.2 mg Transdermal Weekly  . folic acid  1 mg Oral Daily  . heparin  5,000 Units Subcutaneous Q8H  . multivitamin with minerals  1 tablet Oral Daily  . sodium chloride flush  3 mL Intravenous Q12H  . thiamine  100 mg Oral Daily   Continuous Infusions: . sodium chloride 10 mL/hr at 06/17/19 2000  . sodium chloride 10 mL/hr at 06/18/19 1742  . dexmedetomidine (PRECEDEX) IV infusion 0.7 mcg/kg/hr (06/18/19 1742)     LOS: 8 days    Time spent: 25 minutes   Noe Gens, AG-ACNP-S [facility] 06/18/2019, 7:04 PM     Please page through [place]:  [email or web address] Password [email or web address] If 7PM-7AM, please contact night-coverage              Attending MD Note:  I have seen and examined the patient with NP student and agree with the note above which has been edited to reflect our agreed upon history, exam, and assessment/plan.    I have personally reviewed the orders for the patient, which were made under my direction.        [facility]

## 2019-06-19 LAB — RENAL FUNCTION PANEL
Albumin: 2.7 g/dL — ABNORMAL LOW (ref 3.5–5.0)
Anion gap: 7 (ref 5–15)
BUN: 62 mg/dL — ABNORMAL HIGH (ref 6–20)
CO2: 25 mmol/L (ref 22–32)
Calcium: 8.5 mg/dL — ABNORMAL LOW (ref 8.9–10.3)
Chloride: 109 mmol/L (ref 98–111)
Creatinine, Ser: 3.79 mg/dL — ABNORMAL HIGH (ref 0.61–1.24)
GFR calc Af Amer: 19 mL/min — ABNORMAL LOW (ref >60)
GFR calc non Af Amer: 17 mL/min — ABNORMAL LOW (ref >60)
Glucose, Bld: 129 mg/dL — ABNORMAL HIGH (ref 70–99)
Phosphorus: 5.6 mg/dL — ABNORMAL HIGH (ref 2.5–4.6)
Potassium: 3.9 mmol/L (ref 3.5–5.1)
Sodium: 141 mmol/L (ref 135–145)

## 2019-06-19 LAB — CBC WITH DIFFERENTIAL/PLATELET
Abs Immature Granulocytes: 0.12 10*3/uL — ABNORMAL HIGH (ref 0.00–0.07)
Basophils Absolute: 0 10*3/uL (ref 0.0–0.1)
Basophils Relative: 1 %
Eosinophils Absolute: 0.1 10*3/uL (ref 0.0–0.5)
Eosinophils Relative: 2 %
HCT: 30.3 % — ABNORMAL LOW (ref 39.0–52.0)
Hemoglobin: 9.7 g/dL — ABNORMAL LOW (ref 13.0–17.0)
Immature Granulocytes: 2 %
Lymphocytes Relative: 18 %
Lymphs Abs: 1.2 10*3/uL (ref 0.7–4.0)
MCH: 28.2 pg (ref 26.0–34.0)
MCHC: 32 g/dL (ref 30.0–36.0)
MCV: 88.1 fL (ref 80.0–100.0)
Monocytes Absolute: 0.7 10*3/uL (ref 0.1–1.0)
Monocytes Relative: 10 %
Neutro Abs: 4.7 10*3/uL (ref 1.7–7.7)
Neutrophils Relative %: 67 %
Platelets: 217 10*3/uL (ref 150–400)
RBC: 3.44 MIL/uL — ABNORMAL LOW (ref 4.22–5.81)
RDW: 12.3 % (ref 11.5–15.5)
WBC: 6.9 10*3/uL (ref 4.0–10.5)
nRBC: 0 % (ref 0.0–0.2)

## 2019-06-19 LAB — MAGNESIUM: Magnesium: 2.1 mg/dL (ref 1.7–2.4)

## 2019-06-19 MED ORDER — DEXMEDETOMIDINE HCL IN NACL 400 MCG/100ML IV SOLN
0.4000 ug/kg/h | INTRAVENOUS | Status: DC
  Administered 2019-06-19: 0.8 ug/kg/h via INTRAVENOUS
  Filled 2019-06-19: qty 100

## 2019-06-19 NOTE — Plan of Care (Signed)
  Problem: Clinical Measurements: Goal: Ability to maintain clinical measurements within normal limits will improve Outcome: Progressing   Problem: Elimination: Goal: Will not experience complications related to bowel motility Outcome: Progressing   Problem: Education: Goal: Knowledge of General Education information will improve Description: Including pain rating scale, medication(s)/side effects and non-pharmacologic comfort measures Outcome: Not Progressing

## 2019-06-19 NOTE — Progress Notes (Signed)
PT Cancellation Note  Patient Details Name: Martin Diaz MRN: [id number] DOB: 1961-02-04   Cancelled Treatment:    Reason Eval/Treat Not Completed: Other (comment).  PT consult received.  Chart reviewed.  Discussed pt with pt's nurse.  Pt still on Precedex drip (plan to wean off today) and has R IJ temporary HD catheter (plan to remove today).  Will hold therapy at this time and re-attempt PT evaluation at a later date/time once pt is off Precedex drip and able to more optimally participate in therapy.  Leitha Bleak, PT 06/19/19, 10:10 AM [phone]

## 2019-06-19 NOTE — Progress Notes (Signed)
[facility]  ROUNDING NOTE   Subjective:   Continues on sedation.  UOP 1600 Creatinine 3.79 (4.25)   Objective:  Vital signs in last 24 hours:  Temp:  [97.8 F (36.6 C)-99.7 F (37.6 C)] 98.8 F (37.1 C) (11/10 0742) Pulse Rate:  [63-83] 68 (11/10 0800) Resp:  [11-21] 15 (11/10 0800) BP: (156-178)/(78-88) 178/85 (11/10 0800) SpO2:  [86 %-100 %] 97 % (11/10 0800) Weight:  [92.4 kg] 92.4 kg (11/10 0500)  Weight change: -3.6 kg Filed Weights   06/17/19 0409 06/18/19 0500 06/19/19 0500  Weight: 94.9 kg 96 kg 92.4 kg    Intake/Output: I/O last 3 completed shifts: In: 1434.6 [P.O.:360; I.V.:1074.6] Out: 3350 [Urine:2950; Other:400]   Intake/Output this shift:  Total I/O In: 360 [P.O.:360] Out: -   Physical Exam: General: Ill, laying in bed   Head: Normocephalic atraumatic  Eyes: Anicteric  Neck: Supple, trachea midline  Lungs:  Clear to auscultation   Heart: regular  Abdomen:  Soft, nontender, bowel sounds present  Extremities: No peripheral edema.  Neurologic: Sedated   Skin: No lesions  GU: Foley catheter     Basic Metabolic Panel: Recent Labs  Lab 06/15/19 0500 06/16/19 0346 06/17/19 0450 06/18/19 0940 06/19/19 0505  NA 139 142 140 139 141  K 3.9 4.0 3.6 3.6 3.9  CL 99 102 105 104 109  CO2 30 27 25 26 25   GLUCOSE 126* 101* 148* 106* 129*  BUN 52* 61* 72* 65* 62*  CREATININE 4.38* 5.07* 4.62* 4.25* 3.79*  CALCIUM 7.6* 8.2* 8.1* 8.2* 8.5*  MG 2.2 2.4 2.1 2.1 2.1  PHOS 5.3* 5.8* 5.3* 5.6* 5.6*    Liver Function Tests: Recent Labs  Lab 06/13/19 0443  06/13/19 1803  06/15/19 0500 06/16/19 0346 06/17/19 0450 06/18/19 0940 06/19/19 0505  AST 85*  --  81*  --   --  81* 67*  --   --   ALT 99*  --  93*  --   --  80* 72*  --   --   ALKPHOS 55  --  59  --   --  75 75  --   --   BILITOT 0.7  --  0.9  --   --  1.2 0.7  --   --   PROT 6.0*  --  6.4*  --   --  6.5 6.1*  --   --   ALBUMIN 2.5*  2.5*   < > 2.8*   < > 2.6* 2.9*  2.8*  2.7*  2.8* 2.6* 2.7*   < > = values in this interval not displayed.   Recent Labs  Lab 06/16/19 0346  LIPASE 74*   Recent Labs  Lab 06/16/19 0346  AMMONIA 21    CBC: Recent Labs  Lab 06/13/19 0443 06/15/19 0500 06/16/19 0346 06/18/19 0940 06/19/19 0505  WBC 4.5 4.0 5.0 6.9 6.9  NEUTROABS 2.7 2.4  --   --  4.7  HGB 11.3* 10.4* 11.8* 9.3* 9.7*  HCT 32.2* 31.6* 35.5* 28.1* 30.3*  MCV 81.9 87.3 84.9 85.4 88.1  PLT 157 154 172 175 217    Cardiac Enzymes: Recent Labs  Lab 06/13/19 0443 06/14/19 0423 06/15/19 0500  CKTOTAL 10,566* 5,037* 2,424*    BNP: Invalid input(s): POCBNP  CBG: Recent Labs  Lab 06/14/19 0332 06/14/19 0734 06/14/19 1131 06/14/19 1536 06/14/19 1956  GLUCAP 131* 128* 87 91 102*    Microbiology: Results for orders placed or performed during the hospital encounter of 06/10/19  SARS Coronavirus 2 by RT PCR (hospital order, performed in [place] hospital lab)     Status: None   Collection Time: 06/10/19 10:02 PM  Result Value Ref Range Status   SARS Coronavirus 2 NEGATIVE NEGATIVE Final    Comment: (NOTE) If result is NEGATIVE SARS-CoV-2 target nucleic acids are NOT DETECTED. The SARS-CoV-2 RNA is generally detectable in upper and lower  respiratory specimens during the acute phase of infection. The lowest  concentration of SARS-CoV-2 viral copies this assay can detect is 250  copies / mL. A negative result does not preclude SARS-CoV-2 infection  and should not be used as the sole basis for treatment or other  patient management decisions.  A negative result may occur with  improper specimen collection / handling, submission of specimen other  than nasopharyngeal swab, presence of viral mutation(s) within the  areas targeted by this assay, and inadequate number of viral copies  (<250 copies / mL). A negative result must be combined with clinical  observations, patient history, and epidemiological information. If result is  POSITIVE SARS-CoV-2 target nucleic acids are DETECTED. The SARS-CoV-2 RNA is generally detectable in upper and lower  respiratory specimens dur ing the acute phase of infection.  Positive  results are indicative of active infection with SARS-CoV-2.  Clinical  correlation with patient history and other diagnostic information is  necessary to determine patient infection status.  Positive results do  not rule out bacterial infection or co-infection with other viruses. If result is PRESUMPTIVE POSTIVE SARS-CoV-2 nucleic acids MAY BE PRESENT.   A presumptive positive result was obtained on the submitted specimen  and confirmed on repeat testing.  While 2019 novel coronavirus  (SARS-CoV-2) nucleic acids may be present in the submitted sample  additional confirmatory testing may be necessary for epidemiological  and / or clinical management purposes  to differentiate between  SARS-CoV-2 and other Sarbecovirus currently known to infect humans.  If clinically indicated additional testing with an alternate test  methodology [phone]) is advised. The SARS-CoV-2 RNA is generally  detectable in upper and lower respiratory sp ecimens during the acute  phase of infection. The expected result is Negative. Fact Sheet for Patients:  StrictlyIdeas.no Fact Sheet for Healthcare Providers: BankingDealers.co.za This test is not yet approved or cleared by the [place] FDA and has been authorized for detection and/or diagnosis of SARS-CoV-2 by FDA under an Emergency Use Authorization (EUA).  This EUA will remain in effect (meaning this test can be used) for the duration of the COVID-19 declaration under Section 564(b)(1) of the Act, 21 U.S.C. section 360bbb-3(b)(1), unless the authorization is terminated or revoked sooner. Performed at [facility], [place] [address] [place], [place] [address]   MRSA PCR Screening     Status: None   Collection Time:  06/11/19 12:30 PM   Specimen: Nasopharyngeal  Result Value Ref Range Status   MRSA by PCR NEGATIVE NEGATIVE Final    Comment:        The GeneXpert MRSA Assay (FDA approved for NASAL specimens only), is one component of a comprehensive MRSA colonization surveillance program. It is not intended to diagnose MRSA infection nor to guide or monitor treatment for MRSA infections. Performed at [facility], [address] [place], [place] [address]   CULTURE, BLOOD (ROUTINE X 2) w Reflex to ID Panel     Status: None   Collection Time: 06/12/19  1:34 PM   Specimen: BLOOD  Result Value Ref Range Status  Specimen Description BLOOD BLOOD RIGHT HAND  Final   Special Requests   Final    BOTTLES DRAWN AEROBIC AND ANAEROBIC Blood Culture adequate volume   Culture   Final    NO GROWTH 5 DAYS Performed at [facility], [address] [place], [place] [address]    Report Status 06/17/2019 FINAL  Final  CULTURE, BLOOD (ROUTINE X 2) w Reflex to ID Panel     Status: None   Collection Time: 06/12/19  1:35 PM   Specimen: BLOOD  Result Value Ref Range Status   Specimen Description BLOOD BLOOD LEFT FOREARM  Final   Special Requests   Final    BOTTLES DRAWN AEROBIC AND ANAEROBIC Blood Culture adequate volume   Culture   Final    NO GROWTH 5 DAYS Performed at [facility], [address], [place], [place] [address]    Report Status 06/17/2019 FINAL  Final  Culture, respiratory (non-expectorated)     Status: None   Collection Time: 06/13/19 12:10 AM   Specimen: Tracheal Aspirate; Respiratory  Result Value Ref Range Status   Specimen Description   Final    TRACHEAL ASPIRATE Performed at [facility], [address] [place], [place] [address]    Special Requests   Final    NONE Performed at [facility], [place], [place] [address]    Gram Stain   Final    RARE WBC PRESENT, PREDOMINANTLY PMN MODERATE GRAM POSITIVE COCCI IN  PAIRS IN CLUSTERS FEW GRAM NEGATIVE RODS    Culture   Final    FEW Consistent with normal respiratory flora. Performed at [facility], [place] [address] [place], [place] [address]    Report Status 06/15/2019 FINAL  Final    Coagulation Studies: No results for input(s): LABPROT, INR in the last 72 hours.  Urinalysis: No results for input(s): COLORURINE, LABSPEC, PHURINE, GLUCOSEU, HGBUR, BILIRUBINUR, KETONESUR, PROTEINUR, UROBILINOGEN, NITRITE, LEUKOCYTESUR in the last 72 hours.  Invalid input(s): APPERANCEUR    Imaging: No results found.   Medications:   . sodium chloride 10 mL/hr at 06/17/19 2000  . sodium chloride 10 mL/hr at 06/18/19 1742  . dexmedetomidine (PRECEDEX) IV infusion 0.8 mcg/kg/hr (06/19/19 0915)   . Chlorhexidine Gluconate Cloth  6 each Topical Daily  . cloNIDine  0.2 mg Transdermal Weekly  . folic acid  1 mg Oral Daily  . heparin  5,000 Units Subcutaneous Q8H  . multivitamin with minerals  1 tablet Oral Daily  . sodium chloride flush  3 mL Intravenous Q12H  . thiamine  100 mg Oral Daily   sodium chloride, ALPRAZolam, hydrALAZINE, ipratropium-albuterol, labetalol, ondansetron **OR** ondansetron (ZOFRAN) IV  Assessment/ Plan:  58 y.o.black male with alcohol abuse and impaired cognitive function, who was admitted to [facility] on 06/10/2019 for evaluation of weakness, diarrhea, nausea, and vomiting.    1.  Acute kidney injury suspected secondary to prolonged volume depletion (prerenal azotemia) and rhabdomyolysis.   Baseline creatinine of 0.78, normal GFR on 01/2018 Patient required continuous renal placement therapy  - nonoliguric urine output.  - No indication for dialysis. Remove dialysis catheter today - May transition from indwelling foley catheter to condom catheter.   2.  Metabolic acidosis.  Patient off of bicarbonate drip.     3.  Anemia with renal failure: normocytic. Hemoglobin 9.7. No indication for ESA.   4.  Hypokalemia: improved off  bicarb infusion   LOS: 9 Martin Diaz 11/10/202010:21 AM

## 2019-06-19 NOTE — Progress Notes (Addendum)
PROGRESS NOTE    Martin Diaz  YIF:[id number] DOB: Nov 18, 1960 DOA: 06/10/2019 PCP: Patient, No Pcp Per      Brief Narrative:  Martin Diaz is a 58 y.o. M with a history of ETOH abuse and intellectual disability (lives in a boarding house, Aunt/Uncle guardian) admitted 11/1 with reports of several days of N/V/D, shaking and weakness.    In the ER, Cr 11.56, BUN 188.  HCO3 17, AST 450 / ALT 237, alk phos 77, lipase 339. RUQ [place] negative for gallstones or hepatobiliary abnormality.  Started IV fluids for rhabdo, AKI.  Renal US negative for obstruction. Developed worsening mental status and intubated for airway protection.         Assessment & Plan:  Acute Kidney Injury  Rhabdomyolysis  Metabolic Acidosis  Left Renal Cyst  Baseline serum creatinine 0.8, admitted with creatinine 11.56.    Suspect AKI in setting of profound volume depletion and rhabdomyolysis (admit CK 24K).  Nephrology consulted, renal US negative for hydronephrosis.  Started on CRRT, transition to IHD.  Last HD 11/5.  Since then serum creatinine still improving, urine output 1.6 in the last 24 hours, creatinine down to the mid threes.   -Consult nephrology, appreciate cares -Daily BMP -Strict I's and O's -Remove dialysis catheter today -Defer renal cyst follow up to Nephrology      Agitated Delirium  Baseline Cognitive Delay Suspect element of metabolic encephalopathy versus alcohol withdrawal or ICU delirium superimposed on baseline intellectual disability.   Patient was very impulsive and pulling out lines, and had to require sedation with Precedex to prevent inadvertently pulling out his dialysis catheter.    This dialysis catheter can be removed, we have turned off his Precedex this morning -Continue clonidine -Stop Precedex   Acute Respiratory Insufficiency in setting of AMS Possible aspiration pneumonia Intubated at admission for airway protection in setting of AKI, rhabdo.  Extubated 11/5.     Treated empirically for possible aspiration pneumonia, completed course of antibiotics, no further fever.    Hypertension  Blood pressure elevated.  Not on antihypertensives at home -Continue clonidine while in the hospital -Discharged with alternative antihypertensive -PRN IV labetalol, hydralazine for SBP > 170  Hypocalcemia Resolved  ETOH Abuse  Suspect this was given to him by family members or friends when he is out of his group home.  Patient unable to quantify his alcohol intake due to baseline cognitive impairment. -Continue folate, MVI, thiamine  Pancreatitis  Lipase 339 on admit, ETOH abuse hx.  Resolved.       DVT prophylaxis: Heparin Code Status: FULL CODE Family Communication: [name] [phone] called for update 11/7, message left for return call.  Patient's uncle - [name] at [phone] called 11/8, no answer, message left for return call.  Have not been able to reach family via phone.  Have been unable to reach family by phone for several days in a row.     Consultants:   PCCM   Procedures:   ETT 11/3 >> 11/5   R IJ HD Cath 11/3 >> 11/10  Diagnostic Tests: Renal US 11/1 >> medical renal disease of both kidneys, exophytic indeterminate nodule lateral aspect of the upper to mid left kidney 2.2 cm (query solid or complex cystic lesion, MR imaging recommended) ETOH 11/1 >> negative  [place] ABD Limited 11/1 >> no evidence of gallstones or hepatobiliary abnormality  Antimicrobials:   Azithro 11/3 (empiric) >> 11/7  Ceftriaxone 11/3 (empiric >> 11/7   Subjective: Good urine output  last 24 hours, no fever, no cough, no respiratory distress.   Objective: Vitals:   06/19/19 0742 06/19/19 0800 06/19/19 1618 06/19/19 2019  BP:  (!) 178/85 (!) 180/80 (!) 180/84  Pulse:  68 70 90  Resp:  15  16  Temp: 98.8 F (37.1 C)   99.6 F (37.6 C)  TempSrc: Axillary   Oral  SpO2:  97%  99%  Weight:      Height:        Intake/Output Summary  (Last 24 hours) at 06/19/2019 2042 Last data filed at 06/19/2019 2019 Gross per 24 hour  Intake 600 ml  Output 1751 ml  Net -1151 ml   Filed Weights   06/17/19 0409 06/18/19 0500 06/19/19 0500  Weight: 94.9 kg 96 kg 92.4 kg    Examination: General appearance: Well-nourished adult male, sleepy but awake now, in no acute distress  HEENT: Anicteric, conjunctiva pink, lids and lashes normal. No nasal deformity, discharge, epistaxis.  Lips moist, teeth normal. OP moist, no oral lesions.   Skin: Warm and dry.  No suspicious rashes or lesions. Cardiac: RRR, no murmurs appreciated.  No LE edema.    Respiratory: Normal respiratory rate and rhythm.  CTAB without rales or wheezes. Abdomen: Abdomen soft.  No tenderness to palpation or guarding. No ascites, distension, hepatosplenomegaly.   MSK: No deformities or effusions of the large joints of the upper or lower extremities bilaterally. Neuro: Awake but sleepy.  Moves all extremities with global weakness, discoordination.  Speech fluent.    Psych: Sensorium intact responding to questions, psychomotor slowing noted, attention normal, childlike affect, judgment and insight appear impaired      Data Reviewed: I have personally reviewed following labs and imaging studies:  CBC: Recent Labs  Lab 06/13/19 0443 06/15/19 0500 06/16/19 0346 06/18/19 0940 06/19/19 0505  WBC 4.5 4.0 5.0 6.9 6.9  NEUTROABS 2.7 2.4  --   --  4.7  HGB 11.3* 10.4* 11.8* 9.3* 9.7*  HCT 32.2* 31.6* 35.5* 28.1* 30.3*  MCV 81.9 87.3 84.9 85.4 88.1  PLT 157 154 172 175 638   Basic Metabolic Panel: Recent Labs  Lab 06/15/19 0500 06/16/19 0346 06/17/19 0450 06/18/19 0940 06/19/19 0505  NA 139 142 140 139 141  K 3.9 4.0 3.6 3.6 3.9  CL 99 102 105 104 109  CO2 30 27 25 26 25   GLUCOSE 126* 101* 148* 106* 129*  BUN 52* 61* 72* 65* 62*  CREATININE 4.38* 5.07* 4.62* 4.25* 3.79*  CALCIUM 7.6* 8.2* 8.1* 8.2* 8.5*  MG 2.2 2.4 2.1 2.1 2.1  PHOS 5.3* 5.8* 5.3* 5.6*  5.6*   GFR: Estimated Creatinine Clearance: 26.7 mL/min (A) (by C-G formula based on SCr of 3.79 mg/dL (H)). Liver Function Tests: Recent Labs  Lab 06/13/19 0443  06/13/19 1803  06/15/19 0500 06/16/19 0346 06/17/19 0450 06/18/19 0940 06/19/19 0505  AST 85*  --  81*  --   --  81* 67*  --   --   ALT 99*  --  93*  --   --  80* 72*  --   --   ALKPHOS 55  --  59  --   --  75 75  --   --   BILITOT 0.7  --  0.9  --   --  1.2 0.7  --   --   PROT 6.0*  --  6.4*  --   --  6.5 6.1*  --   --   ALBUMIN 2.5*  2.5*   < >  2.8*   < > 2.6* 2.9*  2.8* 2.7*  2.8* 2.6* 2.7*   < > = values in this interval not displayed.   Recent Labs  Lab 06/16/19 0346  LIPASE 74*   Recent Labs  Lab 06/16/19 0346  AMMONIA 21   Coagulation Profile: No results for input(s): INR, PROTIME in the last 168 hours. Cardiac Enzymes: Recent Labs  Lab 06/13/19 0443 06/14/19 0423 06/15/19 0500  CKTOTAL 10,566* 5,037* 2,424*   BNP (last 3 results) No results for input(s): PROBNP in the last 8760 hours. HbA1C: No results for input(s): HGBA1C in the last 72 hours. CBG: Recent Labs  Lab 06/14/19 0332 06/14/19 0734 06/14/19 1131 06/14/19 1536 06/14/19 1956  GLUCAP 131* 128* 87 91 102*   Lipid Profile: No results for input(s): CHOL, HDL, LDLCALC, TRIG, CHOLHDL, LDLDIRECT in the last 72 hours. Thyroid Function Tests: No results for input(s): TSH, T4TOTAL, FREET4, T3FREE, THYROIDAB in the last 72 hours. Anemia Panel: No results for input(s): VITAMINB12, FOLATE, FERRITIN, TIBC, IRON, RETICCTPCT in the last 72 hours. Urine analysis:    Component Value Date/Time   COLORURINE YELLOW (A) 06/11/2019 0316   APPEARANCEUR CLOUDY (A) 06/11/2019 0316   LABSPEC 1.012 06/11/2019 0316   PHURINE 5.0 06/11/2019 0316   GLUCOSEU NEGATIVE 06/11/2019 0316   HGBUR LARGE (A) 06/11/2019 0316   BILIRUBINUR NEGATIVE 06/11/2019 0316   KETONESUR NEGATIVE 06/11/2019 0316   PROTEINUR 100 (A) 06/11/2019 0316   NITRITE NEGATIVE  06/11/2019 0316   LEUKOCYTESUR SMALL (A) 06/11/2019 0316   Sepsis Labs: '@LABRCNTIP'$ (procalcitonin:4,lacticacidven:4)  ) Recent Results (from the past 240 hour(s))  SARS Coronavirus 2 by RT PCR (hospital order, performed in [place] hospital lab)     Status: None   Collection Time: 06/10/19 10:02 PM  Result Value Ref Range Status   SARS Coronavirus 2 NEGATIVE NEGATIVE Final    Comment: (NOTE) If result is NEGATIVE SARS-CoV-2 target nucleic acids are NOT DETECTED. The SARS-CoV-2 RNA is generally detectable in upper and lower  respiratory specimens during the acute phase of infection. The lowest  concentration of SARS-CoV-2 viral copies this assay can detect is 250  copies / mL. A negative result does not preclude SARS-CoV-2 infection  and should not be used as the sole basis for treatment or other  patient management decisions.  A negative result may occur with  improper specimen collection / handling, submission of specimen other  than nasopharyngeal swab, presence of viral mutation(s) within the  areas targeted by this assay, and inadequate number of viral copies  (<250 copies / mL). A negative result must be combined with clinical  observations, patient history, and epidemiological information. If result is POSITIVE SARS-CoV-2 target nucleic acids are DETECTED. The SARS-CoV-2 RNA is generally detectable in upper and lower  respiratory specimens dur ing the acute phase of infection.  Positive  results are indicative of active infection with SARS-CoV-2.  Clinical  correlation with patient history and other diagnostic information is  necessary to determine patient infection status.  Positive results do  not rule out bacterial infection or co-infection with other viruses. If result is PRESUMPTIVE POSTIVE SARS-CoV-2 nucleic acids MAY BE PRESENT.   A presumptive positive result was obtained on the submitted specimen  and confirmed on repeat testing.  While 2019 novel coronavirus   (SARS-CoV-2) nucleic acids may be present in the submitted sample  additional confirmatory testing may be necessary for epidemiological  and / or clinical management purposes  to differentiate between  SARS-CoV-2 and other  Sarbecovirus currently known to infect humans.  If clinically indicated additional testing with an alternate test  methodology [phone]) is advised. The SARS-CoV-2 RNA is generally  detectable in upper and lower respiratory sp ecimens during the acute  phase of infection. The expected result is Negative. Fact Sheet for Patients:  StrictlyIdeas.no Fact Sheet for Healthcare Providers: BankingDealers.co.za This test is not yet approved or cleared by the [place] FDA and has been authorized for detection and/or diagnosis of SARS-CoV-2 by FDA under an Emergency Use Authorization (EUA).  This EUA will remain in effect (meaning this test can be used) for the duration of the COVID-19 declaration under Section 564(b)(1) of the Act, 21 U.S.C. section 360bbb-3(b)(1), unless the authorization is terminated or revoked sooner. Performed at [facility], [place] [address] [place], [place] [address]   MRSA PCR Screening     Status: None   Collection Time: 06/11/19 12:30 PM   Specimen: Nasopharyngeal  Result Value Ref Range Status   MRSA by PCR NEGATIVE NEGATIVE Final    Comment:        The GeneXpert MRSA Assay (FDA approved for NASAL specimens only), is one component of a comprehensive MRSA colonization surveillance program. It is not intended to diagnose MRSA infection nor to guide or monitor treatment for MRSA infections. Performed at [facility], [address] [place], [place] [address]   CULTURE, BLOOD (ROUTINE X 2) w Reflex to ID Panel     Status: None   Collection Time: 06/12/19  1:34 PM   Specimen: BLOOD  Result Value Ref Range Status   Specimen Description BLOOD BLOOD RIGHT HAND  Final    Special Requests   Final    BOTTLES DRAWN AEROBIC AND ANAEROBIC Blood Culture adequate volume   Culture   Final    NO GROWTH 5 DAYS Performed at [facility], [address] [place], [place] [address]    Report Status 06/17/2019 FINAL  Final  CULTURE, BLOOD (ROUTINE X 2) w Reflex to ID Panel     Status: None   Collection Time: 06/12/19  1:35 PM   Specimen: BLOOD  Result Value Ref Range Status   Specimen Description BLOOD BLOOD LEFT FOREARM  Final   Special Requests   Final    BOTTLES DRAWN AEROBIC AND ANAEROBIC Blood Culture adequate volume   Culture   Final    NO GROWTH 5 DAYS Performed at [facility], [address] [place], [place] [address]    Report Status 06/17/2019 FINAL  Final  Culture, respiratory (non-expectorated)     Status: None   Collection Time: 06/13/19 12:10 AM   Specimen: Tracheal Aspirate; Respiratory  Result Value Ref Range Status   Specimen Description   Final    TRACHEAL ASPIRATE Performed at [facility], [address], [place], [place] [address]    Special Requests   Final    NONE Performed at [facility], [place], [place] [address]    Gram Stain   Final    RARE WBC PRESENT, PREDOMINANTLY PMN MODERATE GRAM POSITIVE COCCI IN PAIRS IN CLUSTERS FEW GRAM NEGATIVE RODS    Culture   Final    FEW Consistent with normal respiratory flora. Performed at [facility], [place] [address], [place],  [address]    Report Status 06/15/2019 FINAL  Final         Radiology Studies: No results found.      Scheduled Meds: . Chlorhexidine  Gluconate Cloth  6 each Topical Daily  . cloNIDine  0.2 mg Transdermal Weekly  . folic acid  1 mg Oral Daily  . heparin  5,000 Units Subcutaneous Q8H  . multivitamin with minerals  1 tablet Oral Daily  . sodium chloride flush  3 mL Intravenous Q12H  . thiamine  100 mg Oral Daily   Continuous Infusions: . sodium chloride 10 mL/hr at 06/17/19 2000   . sodium chloride 10 mL/hr at 06/18/19 1742     LOS: 9 days    Time spent: 25 minutes   Noe Gens, AG-ACNP-[facility] 06/19/2019, 8:42 PM     Please page through [place]:  [email or web address] Password [email or web address] If 7PM-7AM, please contact night-coverage       Attending MD Note:  I have seen and examined the patient with NP student and agree with the note above which has been edited to reflect our agreed upon history, exam, and assessment/plan.    I have personally reviewed the orders for the patient, which were made under my direction.        [place]

## 2019-06-20 DIAGNOSIS — E872 Acidosis, unspecified: Secondary | ICD-10-CM

## 2019-06-20 LAB — RENAL FUNCTION PANEL
Albumin: 2.9 g/dL — ABNORMAL LOW (ref 3.5–5.0)
Anion gap: 9 (ref 5–15)
BUN: 50 mg/dL — ABNORMAL HIGH (ref 6–20)
CO2: 24 mmol/L (ref 22–32)
Calcium: 8.5 mg/dL — ABNORMAL LOW (ref 8.9–10.3)
Chloride: 109 mmol/L (ref 98–111)
Creatinine, Ser: 3.34 mg/dL — ABNORMAL HIGH (ref 0.61–1.24)
GFR calc Af Amer: 23 mL/min — ABNORMAL LOW (ref >60)
GFR calc non Af Amer: 19 mL/min — ABNORMAL LOW (ref >60)
Glucose, Bld: 100 mg/dL — ABNORMAL HIGH (ref 70–99)
Phosphorus: 4.9 mg/dL — ABNORMAL HIGH (ref 2.5–4.6)
Potassium: 4 mmol/L (ref 3.5–5.1)
Sodium: 142 mmol/L (ref 135–145)

## 2019-06-20 LAB — CBC WITH DIFFERENTIAL/PLATELET
Abs Immature Granulocytes: 0.09 10*3/uL — ABNORMAL HIGH (ref 0.00–0.07)
Basophils Absolute: 0 10*3/uL (ref 0.0–0.1)
Basophils Relative: 0 %
Eosinophils Absolute: 0.1 10*3/uL (ref 0.0–0.5)
Eosinophils Relative: 1 %
HCT: 30.7 % — ABNORMAL LOW (ref 39.0–52.0)
Hemoglobin: 10.2 g/dL — ABNORMAL LOW (ref 13.0–17.0)
Immature Granulocytes: 1 %
Lymphocytes Relative: 16 %
Lymphs Abs: 1.2 10*3/uL (ref 0.7–4.0)
MCH: 28.6 pg (ref 26.0–34.0)
MCHC: 33.2 g/dL (ref 30.0–36.0)
MCV: 86 fL (ref 80.0–100.0)
Monocytes Absolute: 0.7 10*3/uL (ref 0.1–1.0)
Monocytes Relative: 9 %
Neutro Abs: 5.6 10*3/uL (ref 1.7–7.7)
Neutrophils Relative %: 73 %
Platelets: 254 10*3/uL (ref 150–400)
RBC: 3.57 MIL/uL — ABNORMAL LOW (ref 4.22–5.81)
RDW: 12.7 % (ref 11.5–15.5)
WBC: 7.7 10*3/uL (ref 4.0–10.5)
nRBC: 0 % (ref 0.0–0.2)

## 2019-06-20 LAB — MAGNESIUM: Magnesium: 2 mg/dL (ref 1.7–2.4)

## 2019-06-20 MED ORDER — AMLODIPINE BESYLATE 5 MG PO TABS
5.0000 mg | ORAL_TABLET | Freq: Every day | ORAL | Status: DC
  Administered 2019-06-20 – 2019-06-21 (×2): 5 mg via ORAL
  Filled 2019-06-20 (×2): qty 1

## 2019-06-20 NOTE — Progress Notes (Signed)
PROGRESS NOTE    Martin Diaz  UVO:[id number] DOB: 05-11-1961 DOA: 06/10/2019 PCP: Patient, No Pcp Per   Brief Narrative:  Martin Diaz is a 58 y.o. M with a history of ETOH abuse and intellectual disability (lives in a boarding house, Aunt/Uncle guardian) admitted 11/1 with reports of several days of N/V/D, shaking and weakness.    In the ER, Cr 11.56, BUN 188.  HCO3 17, AST 450 / ALT 237, alk phos 77, lipase 339. RUQ [place] negative for gallstones or hepatobiliary abnormality.  Started IV fluids for rhabdo, AKI.  Renal US negative for obstruction. Developed worsening mental status and intubated for airway protection   Assessment & Plan:   Principal Problem:   Acute renal failure (ARF) (HCC) Active Problems:   Pancreatitis, alcoholic, acute   Alcohol use   Hypocalcemia   Non-traumatic rhabdomyolysis  1 acute renal failure/rhabdomyolysis/metabolic acidosis/left renal cyst (baseline creatinine 0.8) Acute renal failure likely secondary to a prerenal azotemia secondary to volume depletion and rhabdomyolysis.  Patient noted to have a CK level of 24,000 on admission.  Renal ultrasound which was done was negative for hydronephrosis.  Nephrology was consulted who are following the patient.  Patient was started on CRRT and subsequently transition to IHD.  Last HD was 06/14/2019.  Hemodialysis catheter to be removed today per nephrology. Patient with a urine output recorded of 2 L over the past 24 hours.  Creatinine slowly trending down currently at 3.34 from 11.56 on admission.  Nephrology following and appreciate their input and recommendations.  2.  Metabolic acidosis Likely secondary to problem #1.  Resolved on bicarb drip.  Bicarb drip has been discontinued.  Follow.  3.  Agitated delirium/baseline cognitive delay Felt likely secondary to alcohol withdrawal or ICU delirium superimposed on the baseline intellectual disability.  Patient noted per prior physician to be very impulsive pulling  out lines and required sedation with Precedex to prevent inadvertently pulling out his dialysis catheter.  Precedex has been discontinued.  Patient currently on clonidine.  Follow.  4.  Acute respiratory insufficiency in the setting of AMS/possible aspiration Patient was intubated for airway protection in the setting of rhabdomyolysis and acute renal failure.  Patient extubated on 06/14/2019.  Patient was treated empirically for possible aspiration pneumonia completed course of antibiotic treatment.  Patient currently afebrile.  Patient is not hypoxic, with sats of 97% on room air.  No further antibiotics needed at this time.  5.  Hypertension Blood pressure noted to be elevated.  Patient noted not to be on any antihypertensives prior to admission.  Patient currently on clonidine patch.  Norvasc 5 mg daily and uptitrate as needed for better blood pressure control.  IV labetalol and hydralazine as needed.  6.  Hypocalcemia Resolved.  7.  Pancreatitis Lipase noted to be 339 on admission.  Patient history of alcohol abuse.  Patient required Precedex drips however currently off Precedex drip.  Tolerating oral intake.  Resolved.  8.  History of alcohol abuse Prior MD felt patient was likely being given alcohol to him by his family members or friends when he was out of his group home.  Patient unable to quantify amount of alcohol intake likely due to baseline cognitive impairment.  Continue thiamine, folic acid, multivitamin.  Follow.   DVT prophylaxis: Heparin Code Status: Full Family Communication: No family at bedside. Disposition Plan: To be determined.   Consultants:   Nephrology: Dr. Holley Raring 06/11/2019  PCCM: Dr Lanney Gins 06/12/2019  Procedures:  Renal US 11/1 >> medical renal  disease of both kidneys, exophytic indeterminate nodule lateral aspect of the upper to mid left kidney 2.2 cm (query solid or complex cystic lesion, MR imaging recommended) ETOH 11/1 >> negative  [place] ABD Limited  11/1 >> no evidence of gallstones or hepatobiliary abnormality   Antimicrobials:   Azithromycin 06/12/2019>>>> 06/16/2019  IV Rocephin 06/12/2019>>>>> 06/16/2019   Subjective: Patient laying in bed.  Denies any chest pain or shortness of breath.  Nurse tech at bedside.  Patient noted to have had a bowel movement last night however patient denies it.  Objective: Vitals:   06/19/19 2346 06/20/19 0354 06/20/19 0757 06/20/19 1124  BP: (!) 168/76 (!) 159/79 (!) 175/84 (!) 179/79  Pulse: 97 99 89   Resp:  20 (!) 22   Temp:  99.1 F (37.3 C)    TempSrc:  Oral    SpO2:  97% 97%   Weight:      Height:        Intake/Output Summary (Last 24 hours) at 06/20/2019 1223 Last data filed at 06/20/2019 1125 Gross per 24 hour  Intake 724.23 ml  Output 2353 ml  Net -1628.77 ml   Filed Weights   06/17/19 0409 06/18/19 0500 06/19/19 0500  Weight: 94.9 kg 96 kg 92.4 kg    Examination:  General exam: Appears calm and comfortable  Respiratory system: Clear to auscultation. Respiratory effort normal. Cardiovascular system: S1 & S2 heard, RRR. No JVD, murmurs, rubs, gallops or clicks. No pedal edema. Gastrointestinal system: Abdomen is mildly distended, soft, nontender.  Positive bowel sounds.  No rebound.  No guarding.  Central nervous system: Alert and oriented. No focal neurological deficits. Extremities: Symmetric 5 x 5 power. Skin: No rashes, lesions or ulcers Psychiatry: Judgement and insight appear poor. Mood & affect appropriate.     Data Reviewed: I have personally reviewed following labs and imaging studies  CBC: Recent Labs  Lab 06/15/19 0500 06/16/19 0346 06/18/19 0940 06/19/19 0505 06/20/19 0619  WBC 4.0 5.0 6.9 6.9 7.7  NEUTROABS 2.4  --   --  4.7 5.6  HGB 10.4* 11.8* 9.3* 9.7* 10.2*  HCT 31.6* 35.5* 28.1* 30.3* 30.7*  MCV 87.3 84.9 85.4 88.1 86.0  PLT 154 172 175 217 950   Basic Metabolic Panel: Recent Labs  Lab 06/16/19 0346 06/17/19 0450 06/18/19 0940  06/19/19 0505 06/20/19 0619  NA 142 140 139 141 142  K 4.0 3.6 3.6 3.9 4.0  CL 102 105 104 109 109  CO2 _0 GLUCOSE 101* 148* 106* 129* 100*  BUN 61* 72* 65* 62* 50*  CREATININE 5.07* 4.62* 4.25* 3.79* 3.34*  CALCIUM 8.2* 8.1* 8.2* 8.5* 8.5*  MG 2.4 2.1 2.1 2.1 2.0  PHOS 5.8* 5.3* 5.6* 5.6* 4.9*   GFR: Estimated Creatinine Clearance: 30.3 mL/min (A) (by C-G formula based on SCr of 3.34 mg/dL (H)). Liver Function Tests: Recent Labs  Lab 06/13/19 1803  06/16/19 0346 06/17/19 0450 06/18/19 0940 06/19/19 0505 06/20/19 0619  AST 81*  --  81* 67*  --   --   --   ALT 93*  --  80* 72*  --   --   --   ALKPHOS 59  --  75 75  --   --   --   BILITOT 0.9  --  1.2 0.7  --   --   --   PROT 6.4*  --  6.5 6.1*  --   --   --   ALBUMIN 2.8*   < >  2.9*   2.8* 2.7*   2.8* 2.6* 2.7* 2.9*   < > = values in this interval not displayed.   Recent Labs  Lab 06/16/19 0346  LIPASE 74*   Recent Labs  Lab 06/16/19 0346  AMMONIA 21   Coagulation Profile: No results for input(s): INR, PROTIME in the last 168 hours. Cardiac Enzymes: Recent Labs  Lab 06/14/19 0423 06/15/19 0500  CKTOTAL 5,037* 2,424*   BNP (last 3 results) No results for input(s): PROBNP in the last 8760 hours. HbA1C: No results for input(s): HGBA1C in the last 72 hours. CBG: Recent Labs  Lab 06/14/19 0332 06/14/19 0734 06/14/19 1131 06/14/19 1536 06/14/19 1956  GLUCAP 131* 128* 87 91 102*   Lipid Profile: No results for input(s): CHOL, HDL, LDLCALC, TRIG, CHOLHDL, LDLDIRECT in the last 72 hours. Thyroid Function Tests: No results for input(s): TSH, T4TOTAL, FREET4, T3FREE, THYROIDAB in the last 72 hours. Anemia Panel: No results for input(s): VITAMINB12, FOLATE, FERRITIN, TIBC, IRON, RETICCTPCT in the last 72 hours. Sepsis Labs: Recent Labs  Lab 06/14/19 0423  PROCALCITON 0.50    Recent Results (from the past 240 hour(s))  SARS Coronavirus 2 by RT PCR (hospital order, performed in [place]  hospital lab)     Status: None   Collection Time: 06/10/19 10:02 PM  Result Value Ref Range Status   SARS Coronavirus 2 NEGATIVE NEGATIVE Final    Comment: (NOTE) If result is NEGATIVE SARS-CoV-2 target nucleic acids are NOT DETECTED. The SARS-CoV-2 RNA is generally detectable in upper and lower  respiratory specimens during the acute phase of infection. The lowest  concentration of SARS-CoV-2 viral copies this assay can detect is 250  copies / mL. A negative result does not preclude SARS-CoV-2 infection  and should not be used as the sole basis for treatment or other  patient management decisions.  A negative result may occur with  improper specimen collection / handling, submission of specimen other  than nasopharyngeal swab, presence of viral mutation(s) within the  areas targeted by this assay, and inadequate number of viral copies  (<250 copies / mL). A negative result must be combined with clinical  observations, patient history, and epidemiological information. If result is POSITIVE SARS-CoV-2 target nucleic acids are DETECTED. The SARS-CoV-2 RNA is generally detectable in upper and lower  respiratory specimens dur ing the acute phase of infection.  Positive  results are indicative of active infection with SARS-CoV-2.  Clinical  correlation with patient history and other diagnostic information is  necessary to determine patient infection status.  Positive results do  not rule out bacterial infection or co-infection with other viruses. If result is PRESUMPTIVE POSTIVE SARS-CoV-2 nucleic acids MAY BE PRESENT.   A presumptive positive result was obtained on the submitted specimen  and confirmed on repeat testing.  While 2019 novel coronavirus  (SARS-CoV-2) nucleic acids may be present in the submitted sample  additional confirmatory testing may be necessary for epidemiological  and / or clinical management purposes  to differentiate between  SARS-CoV-2 and other Sarbecovirus  currently known to infect humans.  If clinically indicated additional testing with an alternate test  methodology [phone]) is advised. The SARS-CoV-2 RNA is generally  detectable in upper and lower respiratory sp ecimens during the acute  phase of infection. The expected result is Negative. Fact Sheet for Patients:  StrictlyIdeas.no Fact Sheet for Healthcare Providers: BankingDealers.co.za This test is not yet approved or cleared by the [place] FDA and has been authorized for detection  and/or diagnosis of SARS-CoV-2 by FDA under an Emergency Use Authorization (EUA).  This EUA will remain in effect (meaning this test can be used) for the duration of the COVID-19 declaration under Section 564(b)(1) of the Act, 21 U.S.C. section 360bbb-3(b)(1), unless the authorization is terminated or revoked sooner. Performed at [facility], [place] [address], [place], [place] [address]   MRSA PCR Screening     Status: None   Collection Time: 06/11/19 12:30 PM   Specimen: Nasopharyngeal  Result Value Ref Range Status   MRSA by PCR NEGATIVE NEGATIVE Final    Comment:        The GeneXpert MRSA Assay (FDA approved for NASAL specimens only), is one component of a comprehensive MRSA colonization surveillance program. It is not intended to diagnose MRSA infection nor to guide or monitor treatment for MRSA infections. Performed at [facility], [address] [place], [place] [address]   CULTURE, BLOOD (ROUTINE X 2) w Reflex to ID Panel     Status: None   Collection Time: 06/12/19  1:34 PM   Specimen: BLOOD  Result Value Ref Range Status   Specimen Description BLOOD BLOOD RIGHT HAND  Final   Special Requests   Final    BOTTLES DRAWN AEROBIC AND ANAEROBIC Blood Culture adequate volume   Culture   Final    NO GROWTH 5 DAYS Performed at [facility], [address] [place], [place] [address]    Report Status 06/17/2019  FINAL  Final  CULTURE, BLOOD (ROUTINE X 2) w Reflex to ID Panel     Status: None   Collection Time: 06/12/19  1:35 PM   Specimen: BLOOD  Result Value Ref Range Status   Specimen Description BLOOD BLOOD LEFT FOREARM  Final   Special Requests   Final    BOTTLES DRAWN AEROBIC AND ANAEROBIC Blood Culture adequate volume   Culture   Final    NO GROWTH 5 DAYS Performed at [facility], [address], [place], [place] [address]    Report Status 06/17/2019 FINAL  Final  Culture, respiratory (non-expectorated)     Status: None   Collection Time: 06/13/19 12:10 AM   Specimen: Tracheal Aspirate; Respiratory  Result Value Ref Range Status   Specimen Description   Final    TRACHEAL ASPIRATE Performed at [facility], [address] [place], [place] [address]    Special Requests   Final    NONE Performed at [facility], [place], [place] [address]    Gram Stain   Final    RARE WBC PRESENT, PREDOMINANTLY PMN MODERATE GRAM POSITIVE COCCI IN PAIRS IN CLUSTERS FEW GRAM NEGATIVE RODS    Culture   Final    FEW Consistent with normal respiratory flora. Performed at [facility], [place] [address] [place], [place] [address]    Report Status 06/15/2019 FINAL  Final         Radiology Studies: No results found.      Scheduled Meds:  amLODipine  5 mg Oral Daily   cloNIDine  0.2 mg Transdermal Weekly   folic acid  1 mg Oral Daily   heparin  5,000 Units Subcutaneous Q8H   multivitamin with minerals  1 tablet Oral Daily   sodium chloride flush  3 mL Intravenous Q12H   thiamine  100 mg Oral Daily   Continuous Infusions:  sodium chloride 10 mL/hr at 06/17/19 2000     LOS: 10 days  Time spent: 35 minutes    Irine Seal, MD [facility]  If 7PM-7AM, please contact night-coverage [email or web address] 06/20/2019, 12:23 PM

## 2019-06-20 NOTE — Progress Notes (Signed)
[facility]  ROUNDING NOTE   Subjective:   Moved to 2A from ICU Sitter at bedside.  HD catheter removed. Patient is eating. He pulled out his IV. So currently without IV fluids.   Creatinine 3.34 (3.79) (4.25)   Objective:  Vital signs in last 24 hours:  Temp:  [99.1 F (37.3 C)-99.6 F (37.6 C)] 99.1 F (37.3 C) (11/11 0354) Pulse Rate:  [70-99] 89 (11/11 0757) Resp:  [16-22] 22 (11/11 0757) BP: (159-180)/(76-84) 179/79 (11/11 1124) SpO2:  [97 %-99 %] 97 % (11/11 0757)  Weight change:  Filed Weights   06/17/19 0409 06/18/19 0500 06/19/19 0500  Weight: 94.9 kg 96 kg 92.4 kg    Intake/Output: I/O last 3 completed shifts: In: 1324.2 [P.O.:840; I.V.:484.2] Out: 2403 [Urine:2000; Other:400; Stool:3]   Intake/Output this shift:  Total I/O In: 360 [P.O.:360] Out: 400 [Urine:400]  Physical Exam: General: Ill, laying in bed   Head: Normocephalic atraumatic  Eyes: Anicteric  Neck: Supple, trachea midline  Lungs:  Clear to auscultation   Heart: regular  Abdomen:  Soft, nontender, bowel sounds present  Extremities: No peripheral edema.  Neurologic: Sedated   Skin: No lesions  GU: Foley catheter     Basic Metabolic Panel: Recent Labs  Lab 06/16/19 0346 06/17/19 0450 06/18/19 0940 06/19/19 0505 06/20/19 0619  NA 142 140 139 141 142  K 4.0 3.6 3.6 3.9 4.0  CL 102 105 104 109 109  CO2 27 25 26 25 24   GLUCOSE 101* 148* 106* 129* 100*  BUN 61* 72* 65* 62* 50*  CREATININE 5.07* 4.62* 4.25* 3.79* 3.34*  CALCIUM 8.2* 8.1* 8.2* 8.5* 8.5*  MG 2.4 2.1 2.1 2.1 2.0  PHOS 5.8* 5.3* 5.6* 5.6* 4.9*    Liver Function Tests: Recent Labs  Lab 06/13/19 1803  06/16/19 0346 06/17/19 0450 06/18/19 0940 06/19/19 0505 06/20/19 0619  AST 81*  --  81* 67*  --   --   --   ALT 93*  --  80* 72*  --   --   --   ALKPHOS 59  --  75 75  --   --   --   BILITOT 0.9  --  1.2 0.7  --   --   --   PROT 6.4*  --  6.5 6.1*  --   --   --   ALBUMIN 2.8*   < > 2.9*  2.8*  2.7*  2.8* 2.6* 2.7* 2.9*   < > = values in this interval not displayed.   Recent Labs  Lab 06/16/19 0346  LIPASE 74*   Recent Labs  Lab 06/16/19 0346  AMMONIA 21    CBC: Recent Labs  Lab 06/15/19 0500 06/16/19 0346 06/18/19 0940 06/19/19 0505 06/20/19 0619  WBC 4.0 5.0 6.9 6.9 7.7  NEUTROABS 2.4  --   --  4.7 5.6  HGB 10.4* 11.8* 9.3* 9.7* 10.2*  HCT 31.6* 35.5* 28.1* 30.3* 30.7*  MCV 87.3 84.9 85.4 88.1 86.0  PLT 154 172 175 217 254    Cardiac Enzymes: Recent Labs  Lab 06/14/19 0423 06/15/19 0500  CKTOTAL 5,037* 2,424*    BNP: Invalid input(s): POCBNP  CBG: Recent Labs  Lab 06/14/19 0332 06/14/19 0734 06/14/19 1131 06/14/19 1536 06/14/19 1956  GLUCAP 131* 128* 76 91 102*    Microbiology: Results for orders placed or performed during the hospital encounter of 06/10/19  SARS Coronavirus 2 by RT PCR (hospital order, performed in [facility] hospital lab)  Status: None   Collection Time: 06/10/19 10:02 PM  Result Value Ref Range Status   SARS Coronavirus 2 NEGATIVE NEGATIVE Final    Comment: (NOTE) If result is NEGATIVE SARS-CoV-2 target nucleic acids are NOT DETECTED. The SARS-CoV-2 RNA is generally detectable in upper and lower  respiratory specimens during the acute phase of infection. The lowest  concentration of SARS-CoV-2 viral copies this assay can detect is 250  copies / mL. A negative result does not preclude SARS-CoV-2 infection  and should not be used as the sole basis for treatment or other  patient management decisions.  A negative result may occur with  improper specimen collection / handling, submission of specimen other  than nasopharyngeal swab, presence of viral mutation(s) within the  areas targeted by this assay, and inadequate number of viral copies  (<250 copies / mL). A negative result must be combined with clinical  observations, patient history, and epidemiological information. If result is POSITIVE SARS-CoV-2 target  nucleic acids are DETECTED. The SARS-CoV-2 RNA is generally detectable in upper and lower  respiratory specimens dur ing the acute phase of infection.  Positive  results are indicative of active infection with SARS-CoV-2.  Clinical  correlation with patient history and other diagnostic information is  necessary to determine patient infection status.  Positive results do  not rule out bacterial infection or co-infection with other viruses. If result is PRESUMPTIVE POSTIVE SARS-CoV-2 nucleic acids MAY BE PRESENT.   A presumptive positive result was obtained on the submitted specimen  and confirmed on repeat testing.  While 2019 novel coronavirus  (SARS-CoV-2) nucleic acids may be present in the submitted sample  additional confirmatory testing may be necessary for epidemiological  and / or clinical management purposes  to differentiate between  SARS-CoV-2 and other Sarbecovirus currently known to infect humans.  If clinically indicated additional testing with an alternate test  methodology [phone]) is advised. The SARS-CoV-2 RNA is generally  detectable in upper and lower respiratory sp ecimens during the acute  phase of infection. The expected result is Negative. Fact Sheet for Patients:  StrictlyIdeas.no Fact Sheet for Healthcare Providers: BankingDealers.co.za This test is not yet approved or cleared by the [place] FDA and has been authorized for detection and/or diagnosis of SARS-CoV-2 by FDA under an Emergency Use Authorization (EUA).  This EUA will remain in effect (meaning this test can be used) for the duration of the COVID-19 declaration under Section 564(b)(1) of the Act, 21 U.S.C. section 360bbb-3(b)(1), unless the authorization is terminated or revoked sooner. Performed at [facility], [place] [address] [place], [place] [address]   MRSA PCR Screening     Status: None   Collection Time: 06/11/19 12:30 PM    Specimen: Nasopharyngeal  Result Value Ref Range Status   MRSA by PCR NEGATIVE NEGATIVE Final    Comment:        The GeneXpert MRSA Assay (FDA approved for NASAL specimens only), is one component of a comprehensive MRSA colonization surveillance program. It is not intended to diagnose MRSA infection nor to guide or monitor treatment for MRSA infections. Performed at [facility], [address] [place], [place] [address]   CULTURE, BLOOD (ROUTINE X 2) w Reflex to ID Panel     Status: None   Collection Time: 06/12/19  1:34 PM   Specimen: BLOOD  Result Value Ref Range Status   Specimen Description BLOOD BLOOD RIGHT HAND  Final   Special Requests   Final  BOTTLES DRAWN AEROBIC AND ANAEROBIC Blood Culture adequate volume   Culture   Final    NO GROWTH 5 DAYS Performed at [facility], [address] [place], [place] [address]    Report Status 06/17/2019 FINAL  Final  CULTURE, BLOOD (ROUTINE X 2) w Reflex to ID Panel     Status: None   Collection Time: 06/12/19  1:35 PM   Specimen: BLOOD  Result Value Ref Range Status   Specimen Description BLOOD BLOOD LEFT FOREARM  Final   Special Requests   Final    BOTTLES DRAWN AEROBIC AND ANAEROBIC Blood Culture adequate volume   Culture   Final    NO GROWTH 5 DAYS Performed at [facility], [address] [place], [place] [address]    Report Status 06/17/2019 FINAL  Final  Culture, respiratory (non-expectorated)     Status: None   Collection Time: 06/13/19 12:10 AM   Specimen: Tracheal Aspirate; Respiratory  Result Value Ref Range Status   Specimen Description   Final    TRACHEAL ASPIRATE Performed at [facility], [address] [place], [place] [address]    Special Requests   Final    NONE Performed at [facility], [place], [place] [address]    Gram Stain   Final    RARE WBC PRESENT, PREDOMINANTLY PMN MODERATE GRAM POSITIVE COCCI IN PAIRS IN CLUSTERS FEW  GRAM NEGATIVE RODS    Culture   Final    FEW Consistent with normal respiratory flora. Performed at [facility], [place] [address], [place], [place] [address]    Report Status 06/15/2019 FINAL  Final    Coagulation Studies: No results for input(s): LABPROT, INR in the last 72 hours.  Urinalysis: No results for input(s): COLORURINE, LABSPEC, PHURINE, GLUCOSEU, HGBUR, BILIRUBINUR, KETONESUR, PROTEINUR, UROBILINOGEN, NITRITE, LEUKOCYTESUR in the last 72 hours.  Invalid input(s): APPERANCEUR    Imaging: No results found.   Medications:   . sodium chloride 10 mL/hr at 06/17/19 2000   . amLODipine  5 mg Oral Daily  . cloNIDine  0.2 mg Transdermal Weekly  . folic acid  1 mg Oral Daily  . heparin  5,000 Units Subcutaneous Q8H  . multivitamin with minerals  1 tablet Oral Daily  . sodium chloride flush  3 mL Intravenous Q12H  . thiamine  100 mg Oral Daily   sodium chloride, ALPRAZolam, hydrALAZINE, ipratropium-albuterol, labetalol, ondansetron **OR** ondansetron (ZOFRAN) IV  Assessment/ Plan:  58 y.o.black male with alcohol abuse and impaired cognitive function, who was admitted to [facility] on 06/10/2019 for evaluation of weakness, diarrhea, nausea, and vomiting.    1.  Acute kidney injury suspected secondary to prolonged volume depletion (prerenal azotemia) and rhabdomyolysis.   Baseline creatinine of 0.78, normal GFR on 01/2018 Patient required continuous renal placement therapy  - nonoliguric urine output.  - No indication for dialysis. Remove dialysis catheter today - May transition from indwelling foley catheter to condom catheter.   2.  Metabolic acidosis.  Patient off of bicarbonate drip.     3.  Anemia with renal failure: normocytic. Hemoglobin 10.2, normocytic. No indication for ESA.   4.  Hypokalemia: improved off bicarb infusion   LOS: 10 Martin Diaz 11/11/20201:45 PM

## 2019-06-20 NOTE — Evaluation (Signed)
Physical Therapy Evaluation Patient Details Name: Martin Diaz MRN: [id number] DOB: [birth year] Today's Date: 06/20/2019   History of Present Illness  Pt is a 58 y.o. male presenting to hospital 06/10/19 with abdominal pain, generalized decline, generalized weakness, shakiness, vomiting, and diarrhea.  Pt admitted with acute renal failure, pancreatitis, hypocalcemia, and rhabdomyolysis.  Intubated 11/3 and extubated 11/5.  R IJ temporary HD catheter placed 11/3 and removed 11/10.  PMH includes alcohol use, cognitive impairment (pt chart pt's uncle reports pt functions at 60 y.o. level).  Clinical Impression  Prior to hospital admission, pt reports being ambulatory (no AD); no family present to verify information.  Per notes pt lives at a boarding house.  Currently pt is mod to max assist semi-supine to sit; max assist to stand up to RW; unable to take any steps with RW and 1 assist; and 2 assist to reposition in bed end of session.  Pt reporting B LE weakness and general pain in B LE's in standing.  Difficulty initiating movement for activities noted and pt requiring increased time for all activities.  Pt would benefit from skilled PT to address noted impairments and functional limitations (see below for any additional details).  Upon hospital discharge, recommend pt discharge to [facility].    Follow Up Recommendations SNF    Equipment Recommendations  Rolling walker with 5" wheels;3in1 (PT);Wheelchair (measurements PT);Wheelchair cushion (measurements PT)    Recommendations for Other Services OT consult     Precautions / Restrictions Precautions Precautions: Fall Precaution Comments: aspiration Restrictions Weight Bearing Restrictions: No      Mobility  Bed Mobility Overal bed mobility: Needs Assistance Bed Mobility: Supine to Sit;Sit to Supine     Supine to sit: Mod assist;Max assist;HOB elevated Sit to supine: +2 for physical assistance   General bed mobility comments: mod to max  assist x1 for trunk > LE's semi-supine to sit; 2 assist sit to supine and to boost up in bed using bed sheets  Transfers Overall transfer level: Needs assistance Equipment used: Rolling walker (2 wheeled);None Transfers: Sit to/from Stand Sit to Stand: Max assist;From elevated surface         General transfer comment: able to perform 3/4th stand up to RW 1st trial standing (pt in flexed posture) and 2nd trial standing (with bed height elevated) pt able to stand upright to RW (after vc's for upright posture); assist to initiate stand up to RW; vc's for UE/LE placement and overall transfer technique; unable to transfer pt to L or to R (with armrest lowered) with 1 assist and max cueing via squat pivot (pt with difficulty initiating movement) so pt assisted back to bed  Ambulation/Gait             General Gait Details: pt unable to take any steps with 1 assist and walker use and max cueing (pt assisted back to sitting on edge of bed)  Stairs            Wheelchair Mobility    Modified Rankin (Stroke Patients Only)       Balance Overall balance assessment: Needs assistance Sitting-balance support: Single extremity supported;Feet supported Sitting balance-Leahy Scale: Fair Sitting balance - Comments: pt requiring at least single UE support for static sitting balance   Standing balance support: Bilateral upper extremity supported Standing balance-Leahy Scale: Poor Standing balance comment: pt pushing B LE's against bed to stabilize in standing with B UE support on RW  Pertinent Vitals/Pain Pain Assessment: Faces Faces Pain Scale: No hurt(no hurt at rest; 4/10 with standing) Pain Location: B LE's Pain Descriptors / Indicators: Discomfort;Sore Pain Intervention(s): Limited activity within patient's tolerance;Monitored during session;Repositioned    Home Living Family/patient expects to be discharged to:: Other (Comment)                  Additional Comments: Per chart pt lives at a boarding house and pt's aunt is his legal guardian    Prior Function Level of Independence: Independent         Comments: Pt reports being independent (no AD) prior to hospitalization; no recent falls; and lives in 1 level home with 3 steps to enter (with B railings)     Hand Dominance        Extremity/Trunk Assessment   Upper Extremity Assessment Upper Extremity Assessment: Generalized weakness    Lower Extremity Assessment Lower Extremity Assessment: LLE deficits/detail;RLE deficits/detail;Difficult to assess due to impaired cognition RLE Deficits / Details: at least 2-/5 DF/PF AROM; at least 3/5 knee flexion/extension and hip flexion; DF to neutral LLE Deficits / Details: at least 2-/5 DF/PF AROM; at least 3/5 knee flexion/extension and hip flexion; DF to neutral       Communication   Communication: Expressive difficulties(increased time to respond; difficult to understand pt intermitently)  Cognition Arousal/Alertness: Awake/alert Behavior During Therapy: Flat affect Overall Cognitive Status: No family/caregiver present to determine baseline cognitive functioning                                 General Comments: Oriented to person and place.  Increased time to respond and initiate movement.  Inconsistently following 1 step commands.      General Comments   Nursing cleared pt for participation in physical therapy.  Pt agreeable to PT session.  Sitter present during session.    Exercises  Transfer training   Assessment/Plan    PT Assessment Patient needs continued PT services  PT Problem List Decreased strength;Decreased activity tolerance;Decreased range of motion;Decreased balance;Decreased mobility;Decreased knowledge of use of DME;Decreased knowledge of precautions;Decreased safety awareness;Pain       PT Treatment Interventions DME instruction;Gait training;Stair training;Functional  mobility training;Therapeutic activities;Therapeutic exercise;Balance training;Patient/family education    PT Goals (Current goals can be found in the Care Plan section)  Acute Rehab PT Goals Patient Stated Goal: to go home PT Goal Formulation: With patient Time For Goal Achievement: 07/04/19 Potential to Achieve Goals: Fair    Frequency Min 2X/week   Barriers to discharge Decreased caregiver support      Co-evaluation               AM-PAC PT "6 Clicks" Mobility  Outcome Measure Help needed turning from your back to your side while in a flat bed without using bedrails?: A Little Help needed moving from lying on your back to sitting on the side of a flat bed without using bedrails?: A Lot Help needed moving to and from a bed to a chair (including a wheelchair)?: Total Help needed standing up from a chair using your arms (e.g., wheelchair or bedside chair)?: A Lot Help needed to walk in hospital room?: Total Help needed climbing 3-5 steps with a railing? : Total 6 Click Score: 10    End of Session Equipment Utilized During Treatment: Gait belt Activity Tolerance: Patient limited by fatigue Patient left: in bed;with call bell/phone within reach;with nursing/sitter in room(Sitter present;  B heels floating via pillow) Nurse Communication: Mobility status;Precautions PT Visit Diagnosis: Other abnormalities of gait and mobility (R26.89);Muscle weakness (generalized) (M62.81);Difficulty in walking, not elsewhere classified (R26.2)    Time: 1536-1600 PT Time Calculation (min) (ACUTE ONLY): 24 min   Charges:   PT Evaluation $PT Eval Low Complexity: 1 Low PT Treatments $Therapeutic Activity: 8-22 mins        Leitha Bleak, PT 06/20/19, 4:38 PM [phone]

## 2019-06-21 LAB — CK: Total CK: 200 U/L (ref 49–397)

## 2019-06-21 LAB — MAGNESIUM: Magnesium: 1.9 mg/dL (ref 1.7–2.4)

## 2019-06-21 MED ORDER — AMLODIPINE BESYLATE 5 MG PO TABS
5.0000 mg | ORAL_TABLET | Freq: Once | ORAL | Status: AC
  Administered 2019-06-21: 5 mg via ORAL
  Filled 2019-06-21: qty 1

## 2019-06-21 MED ORDER — AMLODIPINE BESYLATE 10 MG PO TABS
10.0000 mg | ORAL_TABLET | Freq: Every day | ORAL | Status: DC
  Administered 2019-06-22 – 2019-06-27 (×6): 10 mg via ORAL
  Filled 2019-06-21 (×6): qty 1

## 2019-06-21 NOTE — Care Management Important Message (Signed)
Important Message  Patient Details  Name: Phuoc Pellicano MRN: [id number] Date of Birth: 03-11-1961   Medicare Important Message Given:  Yes     Dannette Barbara 06/21/2019, 1:39 PM

## 2019-06-21 NOTE — Progress Notes (Addendum)
PROGRESS NOTE    Lennex Pietila  [id number] DOB: May 09, 1961 DOA: 06/10/2019 PCP: Patient, No Pcp Per   Brief Narrative:  Nadir Vasques is a 58 y.o. M with a history of ETOH abuse and intellectual disability (lives in a boarding house, Aunt/Uncle guardian) admitted 11/1 with reports of several days of N/V/D, shaking and weakness.    In the ER, Cr 11.56, BUN 188.  HCO3 17, AST 450 / ALT 237, alk phos 77, lipase 339. RUQ [place] negative for gallstones or hepatobiliary abnormality.  Started IV fluids for rhabdo, AKI.  Renal US negative for obstruction. Developed worsening mental status and intubated for airway protection   Assessment & Plan:   Principal Problem:   Acute renal failure (ARF) (HCC) Active Problems:   Pancreatitis, alcoholic, acute   Alcohol use   Hypocalcemia   Non-traumatic rhabdomyolysis   Metabolic acidosis  1 acute renal failure/rhabdomyolysis/metabolic acidosis/left renal cyst (baseline creatinine 0.8) Acute renal failure likely secondary to a prerenal azotemia secondary to volume depletion and rhabdomyolysis.  Patient noted to have a CK level of 24,000 on admission.  Renal ultrasound which was done was negative for hydronephrosis.  Nephrology was consulted who are following the patient.  Patient was started on CRRT and subsequently transition to IHD.  Last HD was 06/14/2019.  Hemodialysis catheter to be removed today per nephrology. Patient with a urine output recorded of 1.675 L over the past 24 hours.  Creatinine slowly trending down and was at 3.34 (06/20/2019) from 11.56 on admission.  Morning labs pending.  Nephrology following and appreciate their input and recommendations.  2.  Metabolic acidosis Likely secondary to problem #1.  Resolved on bicarb drip.  Bicarb drip has been discontinued.  Follow.  3.  Agitated delirium/baseline cognitive delay Felt likely secondary to alcohol withdrawal or ICU delirium superimposed on the baseline intellectual disability.   Patient noted per prior physician to be very impulsive pulling out lines and required sedation with Precedex to prevent inadvertently pulling out his dialysis catheter.  Precedex has been discontinued.  Patient currently on clonidine.  Follow.  4.  Acute respiratory insufficiency in the setting of AMS/possible aspiration Patient was intubated for airway protection in the setting of rhabdomyolysis and acute renal failure.  Patient extubated on 06/14/2019.  Patient was treated empirically for possible aspiration pneumonia completed course of antibiotic treatment.  Patient currently afebrile.  Patient is not hypoxic, with sats of 97% on room air.  No further antibiotics needed at this time.  5.  Hypertension Blood pressure noted to be elevated.  Patient noted not to be on any antihypertensives prior to admission.  Patient currently on clonidine patch.  Increase Norvasc to 10 mg daily.  Continue clonidine patch.  IV labetalol and hydralazine as needed.  6.  Hypocalcemia Resolved.  7.  Pancreatitis Lipase noted to be 339 on admission.  Patient history of alcohol abuse.  Patient required Precedex drips however currently off Precedex drip.  Tolerating oral intake.  Resolved.  8.  History of alcohol abuse Prior MD felt patient was likely being given alcohol to him by his family members or friends when he was out of his group home.  Patient unable to quantify amount of alcohol intake likely due to baseline cognitive impairment.  Continue thiamine, folic acid, multivitamin.  Follow.   DVT prophylaxis: Heparin Code Status: Full Family Communication: No family at bedside. Disposition Plan: SNF versus group home with home health following, to be determined.   Consultants:   Nephrology: Dr. Holley Raring  06/11/2019  PCCM: Dr Lanney Gins 06/12/2019  Procedures:  Renal US 11/1 >> medical renal disease of both kidneys, exophytic indeterminate nodule lateral aspect of the upper to mid left kidney 2.2 cm (query  solid or complex cystic lesion, MR imaging recommended) ETOH 11/1 >> negative  [place] ABD Limited 11/1 >> no evidence of gallstones or hepatobiliary abnormality   Antimicrobials:   Azithromycin 06/12/2019>>>> 06/16/2019  IV Rocephin 06/12/2019>>>>> 06/16/2019   Subjective: Patient laying in bed watching television.  Denies any chest pain or shortness of breath.  Labs pending.  Objective: Vitals:   06/20/19 1951 06/21/19 0429 06/21/19 0735 06/21/19 1102  BP: (!) 176/87 (!) 169/86 (!) 179/91 (!) 178/89  Pulse: 90 81 77 82  Resp: 18 20 20    Temp: 99.3 F (37.4 C) 99.4 F (37.4 C) 98.1 F (36.7 C)   TempSrc: Oral Oral    SpO2: 100% 99% 95%   Weight:  93.4 kg    Height:        Intake/Output Summary (Last 24 hours) at 06/21/2019 1156 Last data filed at 06/21/2019 1015 Gross per 24 hour  Intake 240 ml  Output 1675 ml  Net -1435 ml   Filed Weights   06/18/19 0500 06/19/19 0500 06/21/19 0429  Weight: 96 kg 92.4 kg 93.4 kg    Examination:  General exam: NAD Respiratory system: Coarse breath sounds anterior lung fields.  Normal respiratory effort.  Cardiovascular system: Regular rate and rhythm no murmurs rubs or gallops.  No JVD.  No lower extremity edema.  Gastrointestinal system: Abdomen is mildly distended, soft, nontender, positive bowel sounds.  No rebound.  No guarding.  Central nervous system: Alert and oriented. No focal neurological deficits. Extremities: Symmetric 5 x 5 power. Skin: No rashes, lesions or ulcers Psychiatry: Judgement and insight appear poor. Mood & affect appropriate.     Data Reviewed: I have personally reviewed following labs and imaging studies  CBC: Recent Labs  Lab 06/15/19 0500 06/16/19 0346 06/18/19 0940 06/19/19 0505 06/20/19 0619  WBC 4.0 5.0 6.9 6.9 7.7  NEUTROABS 2.4  --   --  4.7 5.6  HGB 10.4* 11.8* 9.3* 9.7* 10.2*  HCT 31.6* 35.5* 28.1* 30.3* 30.7*  MCV 87.3 84.9 85.4 88.1 86.0  PLT 154 172 175 217 675   Basic Metabolic  Panel: Recent Labs  Lab 06/16/19 0346 06/17/19 0450 06/18/19 0940 06/19/19 0505 06/20/19 0619  NA 142 140 139 141 142  K 4.0 3.6 3.6 3.9 4.0  CL 102 105 104 109 109  CO2 27 25 26 25 24   GLUCOSE 101* 148* 106* 129* 100*  BUN 61* 72* 65* 62* 50*  CREATININE 5.07* 4.62* 4.25* 3.79* 3.34*  CALCIUM 8.2* 8.1* 8.2* 8.5* 8.5*  MG 2.4 2.1 2.1 2.1 2.0  PHOS 5.8* 5.3* 5.6* 5.6* 4.9*   GFR: Estimated Creatinine Clearance: 30.3 mL/min (A) (by C-G formula based on SCr of 3.34 mg/dL (H)). Liver Function Tests: Recent Labs  Lab 06/16/19 0346 06/17/19 0450 06/18/19 0940 06/19/19 0505 06/20/19 0619  AST 81* 67*  --   --   --   ALT 80* 72*  --   --   --   ALKPHOS 75 75  --   --   --   BILITOT 1.2 0.7  --   --   --   PROT 6.5 6.1*  --   --   --   ALBUMIN 2.9*  2.8* 2.7*  2.8* 2.6* 2.7* 2.9*   Recent Labs  Lab 06/16/19 0346  LIPASE 74*   Recent Labs  Lab 06/16/19 0346  AMMONIA 21   Coagulation Profile: No results for input(s): INR, PROTIME in the last 168 hours. Cardiac Enzymes: Recent Labs  Lab 06/15/19 0500  CKTOTAL 2,424*   BNP (last 3 results) No results for input(s): PROBNP in the last 8760 hours. HbA1C: No results for input(s): HGBA1C in the last 72 hours. CBG: Recent Labs  Lab 06/14/19 1536 06/14/19 1956  GLUCAP 91 102*   Lipid Profile: No results for input(s): CHOL, HDL, LDLCALC, TRIG, CHOLHDL, LDLDIRECT in the last 72 hours. Thyroid Function Tests: No results for input(s): TSH, T4TOTAL, FREET4, T3FREE, THYROIDAB in the last 72 hours. Anemia Panel: No results for input(s): VITAMINB12, FOLATE, FERRITIN, TIBC, IRON, RETICCTPCT in the last 72 hours. Sepsis Labs: No results for input(s): PROCALCITON, LATICACIDVEN in the last 168 hours.  Recent Results (from the past 240 hour(s))  MRSA PCR Screening     Status: None   Collection Time: 06/11/19 12:30 PM   Specimen: Nasopharyngeal  Result Value Ref Range Status   MRSA by PCR NEGATIVE NEGATIVE Final     Comment:        The GeneXpert MRSA Assay (FDA approved for NASAL specimens only), is one component of a comprehensive MRSA colonization surveillance program. It is not intended to diagnose MRSA infection nor to guide or monitor treatment for MRSA infections. Performed at [facility], [address] [place], [place] [address]   CULTURE, BLOOD (ROUTINE X 2) w Reflex to ID Panel     Status: None   Collection Time: 06/12/19  1:34 PM   Specimen: BLOOD  Result Value Ref Range Status   Specimen Description BLOOD BLOOD RIGHT HAND  Final   Special Requests   Final    BOTTLES DRAWN AEROBIC AND ANAEROBIC Blood Culture adequate volume   Culture   Final    NO GROWTH 5 DAYS Performed at [facility], [address] [place], [place] [address]    Report Status 06/17/2019 FINAL  Final  CULTURE, BLOOD (ROUTINE X 2) w Reflex to ID Panel     Status: None   Collection Time: 06/12/19  1:35 PM   Specimen: BLOOD  Result Value Ref Range Status   Specimen Description BLOOD BLOOD LEFT FOREARM  Final   Special Requests   Final    BOTTLES DRAWN AEROBIC AND ANAEROBIC Blood Culture adequate volume   Culture   Final    NO GROWTH 5 DAYS Performed at [facility], [address], [place], [place] [address]    Report Status 06/17/2019 FINAL  Final  Culture, respiratory (non-expectorated)     Status: None   Collection Time: 06/13/19 12:10 AM   Specimen: Tracheal Aspirate; Respiratory  Result Value Ref Range Status   Specimen Description   Final    TRACHEAL ASPIRATE Performed at [facility], [address], [place], [place] [address]    Special Requests   Final    NONE Performed at [facility], [place], [place] [address]    Gram Stain   Final    RARE WBC PRESENT, PREDOMINANTLY PMN MODERATE GRAM POSITIVE COCCI IN PAIRS IN CLUSTERS FEW GRAM NEGATIVE RODS    Culture   Final    FEW Consistent with normal respiratory flora. Performed  at [facility], [place] [address] [place], [place] [address]    Report Status 06/15/2019 FINAL  Final         Radiology Studies:  No results found.      Scheduled Meds: . [START ON 06/22/2019] amLODipine  10 mg Oral Daily  . amLODipine  5 mg Oral Once  . cloNIDine  0.2 mg Transdermal Weekly  . folic acid  1 mg Oral Daily  . heparin  5,000 Units Subcutaneous Q8H  . multivitamin with minerals  1 tablet Oral Daily  . sodium chloride flush  3 mL Intravenous Q12H  . thiamine  100 mg Oral Daily   Continuous Infusions: . sodium chloride 10 mL/hr at 06/17/19 2000     LOS: 11 days    Time spent: 35 minutes    Irine Seal, MD [facility]  If 7PM-7AM, please contact night-coverage [email or web address] 06/21/2019, 11:56 AM

## 2019-06-21 NOTE — Progress Notes (Signed)
[facility]  ROUNDING NOTE   Subjective:   No labs this morning. No sitter at bedside. Patient pulled off his condom catheter.   Not able to cooperate with interview this morning.    Objective:  Vital signs in last 24 hours:  Temp:  [98.1 F (36.7 C)-99.4 F (37.4 C)] 98.1 F (36.7 C) (11/12 0735) Pulse Rate:  [77-90] 77 (11/12 0735) Resp:  [18-20] 20 (11/12 0735) BP: (169-179)/(79-91) 179/91 (11/12 0735) SpO2:  [95 %-100 %] 95 % (11/12 0735) Weight:  [93.4 kg] 93.4 kg (11/12 0429)  Weight change:  Filed Weights   06/18/19 0500 06/19/19 0500 06/21/19 0429  Weight: 96 kg 92.4 kg 93.4 kg    Intake/Output: I/O last 3 completed shifts: In: 1084.2 [P.O.:600; I.V.:484.2] Out: 2478 [Urine:2475; Stool:3]   Intake/Output this shift:  Total I/O In: -  Out: 200 [Urine:200]  Physical Exam: General:  laying in bed   Head: Normocephalic atraumatic  Eyes: Anicteric  Neck: Supple, trachea midline  Lungs:  Clear to auscultation   Heart: regular  Abdomen:  Soft, nontender, bowel sounds present  Extremities: No peripheral edema.  Neurologic: Not sedated.   Skin: No lesions        Basic Metabolic Panel: Recent Labs  Lab 06/16/19 0346 06/17/19 0450 06/18/19 0940 06/19/19 0505 06/20/19 0619  NA 142 140 139 141 142  K 4.0 3.6 3.6 3.9 4.0  CL 102 105 104 109 109  CO2 27 25 26 25 24   GLUCOSE 101* 148* 106* 129* 100*  BUN 61* 72* 65* 62* 50*  CREATININE 5.07* 4.62* 4.25* 3.79* 3.34*  CALCIUM 8.2* 8.1* 8.2* 8.5* 8.5*  MG 2.4 2.1 2.1 2.1 2.0  PHOS 5.8* 5.3* 5.6* 5.6* 4.9*    Liver Function Tests: Recent Labs  Lab 06/16/19 0346 06/17/19 0450 06/18/19 0940 06/19/19 0505 06/20/19 0619  AST 81* 67*  --   --   --   ALT 80* 72*  --   --   --   ALKPHOS 75 75  --   --   --   BILITOT 1.2 0.7  --   --   --   PROT 6.5 6.1*  --   --   --   ALBUMIN 2.9*  2.8* 2.7*  2.8* 2.6* 2.7* 2.9*   Recent Labs  Lab 06/16/19 0346  LIPASE 74*   Recent Labs  Lab  06/16/19 0346  AMMONIA 21    CBC: Recent Labs  Lab 06/15/19 0500 06/16/19 0346 06/18/19 0940 06/19/19 0505 06/20/19 0619  WBC 4.0 5.0 6.9 6.9 7.7  NEUTROABS 2.4  --   --  4.7 5.6  HGB 10.4* 11.8* 9.3* 9.7* 10.2*  HCT 31.6* 35.5* 28.1* 30.3* 30.7*  MCV 87.3 84.9 85.4 88.1 86.0  PLT 154 172 175 217 254    Cardiac Enzymes: Recent Labs  Lab 06/15/19 0500  CKTOTAL 2,424*    BNP: Invalid input(s): POCBNP  CBG: Recent Labs  Lab 06/14/19 1131 06/14/19 1536 06/14/19 1956  GLUCAP 25 91 102*    Microbiology: Results for orders placed or performed during the hospital encounter of 06/10/19  SARS Coronavirus 2 by RT PCR (hospital order, performed in [place] hospital lab)     Status: None   Collection Time: 06/10/19 10:02 PM  Result Value Ref Range Status   SARS Coronavirus 2 NEGATIVE NEGATIVE Final    Comment: (NOTE) If result is NEGATIVE SARS-CoV-2 target nucleic acids are NOT DETECTED. The SARS-CoV-2 RNA is generally detectable in upper  and lower  respiratory specimens during the acute phase of infection. The lowest  concentration of SARS-CoV-2 viral copies this assay can detect is 250  copies / mL. A negative result does not preclude SARS-CoV-2 infection  and should not be used as the sole basis for treatment or other  patient management decisions.  A negative result may occur with  improper specimen collection / handling, submission of specimen other  than nasopharyngeal swab, presence of viral mutation(s) within the  areas targeted by this assay, and inadequate number of viral copies  (<250 copies / mL). A negative result must be combined with clinical  observations, patient history, and epidemiological information. If result is POSITIVE SARS-CoV-2 target nucleic acids are DETECTED. The SARS-CoV-2 RNA is generally detectable in upper and lower  respiratory specimens dur ing the acute phase of infection.  Positive  results are indicative of active infection  with SARS-CoV-2.  Clinical  correlation with patient history and other diagnostic information is  necessary to determine patient infection status.  Positive results do  not rule out bacterial infection or co-infection with other viruses. If result is PRESUMPTIVE POSTIVE SARS-CoV-2 nucleic acids MAY BE PRESENT.   A presumptive positive result was obtained on the submitted specimen  and confirmed on repeat testing.  While 2019 novel coronavirus  (SARS-CoV-2) nucleic acids may be present in the submitted sample  additional confirmatory testing may be necessary for epidemiological  and / or clinical management purposes  to differentiate between  SARS-CoV-2 and other Sarbecovirus currently known to infect humans.  If clinically indicated additional testing with an alternate test  methodology [phone]) is advised. The SARS-CoV-2 RNA is generally  detectable in upper and lower respiratory sp ecimens during the acute  phase of infection. The expected result is Negative. Fact Sheet for Patients:  StrictlyIdeas.no Fact Sheet for Healthcare Providers: BankingDealers.co.za This test is not yet approved or cleared by the [place] FDA and has been authorized for detection and/or diagnosis of SARS-CoV-2 by FDA under an Emergency Use Authorization (EUA).  This EUA will remain in effect (meaning this test can be used) for the duration of the COVID-19 declaration under Section 564(b)(1) of the Act, 21 U.S.C. section 360bbb-3(b)(1), unless the authorization is terminated or revoked sooner. Performed at [facility], [place] [address], [place], [place] [address]   MRSA PCR Screening     Status: None   Collection Time: 06/11/19 12:30 PM   Specimen: Nasopharyngeal  Result Value Ref Range Status   MRSA by PCR NEGATIVE NEGATIVE Final    Comment:        The GeneXpert MRSA Assay (FDA approved for NASAL specimens only), is one component of  a comprehensive MRSA colonization surveillance program. It is not intended to diagnose MRSA infection nor to guide or monitor treatment for MRSA infections. Performed at [facility], [address] [place], [place] [address]   CULTURE, BLOOD (ROUTINE X 2) w Reflex to ID Panel     Status: None   Collection Time: 06/12/19  1:34 PM   Specimen: BLOOD  Result Value Ref Range Status   Specimen Description BLOOD BLOOD RIGHT HAND  Final   Special Requests   Final    BOTTLES DRAWN AEROBIC AND ANAEROBIC Blood Culture adequate volume   Culture   Final    NO GROWTH 5 DAYS Performed at [facility], [address] [place], [place] [address]    Report Status 06/17/2019 FINAL  Final  CULTURE, BLOOD (ROUTINE  X 2) w Reflex to ID Panel     Status: None   Collection Time: 06/12/19  1:35 PM   Specimen: BLOOD  Result Value Ref Range Status   Specimen Description BLOOD BLOOD LEFT FOREARM  Final   Special Requests   Final    BOTTLES DRAWN AEROBIC AND ANAEROBIC Blood Culture adequate volume   Culture   Final    NO GROWTH 5 DAYS Performed at [facility], [address] [place], [place] [address]    Report Status 06/17/2019 FINAL  Final  Culture, respiratory (non-expectorated)     Status: None   Collection Time: 06/13/19 12:10 AM   Specimen: Tracheal Aspirate; Respiratory  Result Value Ref Range Status   Specimen Description   Final    TRACHEAL ASPIRATE Performed at [facility], [address] [place], [place] [address]    Special Requests   Final    NONE Performed at [facility], [place], [place] [address]    Gram Stain   Final    RARE WBC PRESENT, PREDOMINANTLY PMN MODERATE GRAM POSITIVE COCCI IN PAIRS IN CLUSTERS FEW GRAM NEGATIVE RODS    Culture   Final    FEW Consistent with normal respiratory flora. Performed at [facility], [place] [address], [place], [place] [address]    Report Status 06/15/2019 FINAL   Final    Coagulation Studies: No results for input(s): LABPROT, INR in the last 72 hours.  Urinalysis: No results for input(s): COLORURINE, LABSPEC, PHURINE, GLUCOSEU, HGBUR, BILIRUBINUR, KETONESUR, PROTEINUR, UROBILINOGEN, NITRITE, LEUKOCYTESUR in the last 72 hours.  Invalid input(s): APPERANCEUR    Imaging: No results found.   Medications:   . sodium chloride 10 mL/hr at 06/17/19 2000   . amLODipine  5 mg Oral Daily  . cloNIDine  0.2 mg Transdermal Weekly  . folic acid  1 mg Oral Daily  . heparin  5,000 Units Subcutaneous Q8H  . multivitamin with minerals  1 tablet Oral Daily  . sodium chloride flush  3 mL Intravenous Q12H  . thiamine  100 mg Oral Daily   sodium chloride, ALPRAZolam, hydrALAZINE, ipratropium-albuterol, labetalol, ondansetron **OR** ondansetron (ZOFRAN) IV  Assessment/ Plan:  58 y.o.black male with alcohol abuse and impaired cognitive function, who was admitted to [facility] on 06/10/2019 for evaluation of weakness, diarrhea, nausea, and vomiting.    1.  Acute kidney injury with metabolic acidosis suspected secondary to prolonged volume depletion (prerenal azotemia) and rhabdomyolysis.   Baseline creatinine of 0.78, normal GFR on 01/2018 Patient required continuous renal placement therapy  - nonoliguric urine output.   2.  Anemia with renal failure:   No indication for ESA.   3. Hypertension: 179/91. Elevated. No home medications - started on amlodipine and clonidine patch this admission.    LOS: 11 Martin Diaz 11/12/202010:03 AM

## 2019-06-21 NOTE — Progress Notes (Signed)
SLP  Note  Patient Details Name: Cordarryl Ohm MRN: [id number] DOB: 1960-11-07   Cancelled treatment:       Reason Eval/Treat Not Completed: Other (comment) Nsg and patient report toleration of current Dysphagia II (chopped) diet w/thin liquids with no overt s/s aspiration. Pt stated he is in agreement with chopped food for easier swallowing. Nsg reported pt prefers eating small portions of sweets off meal trays over main dish items and veggies. Pt may benefit from dietician consult to improve value of nutritional intake with pt preference. Nsg in  Agreement. SLP to sign off at this time, no acute needs identified. Please re-consult with any future change in status requiring reassessment.   Banessa Mao, MA, CCC-SLP 06/21/2019, 2:00 PM

## 2019-06-22 LAB — RENAL FUNCTION PANEL
Albumin: 3.1 g/dL — ABNORMAL LOW (ref 3.5–5.0)
Anion gap: 9 (ref 5–15)
BUN: 33 mg/dL — ABNORMAL HIGH (ref 6–20)
CO2: 24 mmol/L (ref 22–32)
Calcium: 9.2 mg/dL (ref 8.9–10.3)
Chloride: 109 mmol/L (ref 98–111)
Creatinine, Ser: 2.33 mg/dL — ABNORMAL HIGH (ref 0.61–1.24)
GFR calc Af Amer: 35 mL/min — ABNORMAL LOW (ref >60)
GFR calc non Af Amer: 30 mL/min — ABNORMAL LOW (ref >60)
Glucose, Bld: 108 mg/dL — ABNORMAL HIGH (ref 70–99)
Phosphorus: 5 mg/dL — ABNORMAL HIGH (ref 2.5–4.6)
Potassium: 3.9 mmol/L (ref 3.5–5.1)
Sodium: 142 mmol/L (ref 135–145)

## 2019-06-22 LAB — GLUCOSE, CAPILLARY: Glucose-Capillary: 88 mg/dL (ref 70–99)

## 2019-06-22 LAB — CBC
HCT: 31.4 % — ABNORMAL LOW (ref 39.0–52.0)
Hemoglobin: 10.5 g/dL — ABNORMAL LOW (ref 13.0–17.0)
MCH: 28.4 pg (ref 26.0–34.0)
MCHC: 33.4 g/dL (ref 30.0–36.0)
MCV: 84.9 fL (ref 80.0–100.0)
Platelets: 288 10*3/uL (ref 150–400)
RBC: 3.7 MIL/uL — ABNORMAL LOW (ref 4.22–5.81)
RDW: 12.7 % (ref 11.5–15.5)
WBC: 5.9 10*3/uL (ref 4.0–10.5)
nRBC: 0 % (ref 0.0–0.2)

## 2019-06-22 LAB — MAGNESIUM: Magnesium: 1.8 mg/dL (ref 1.7–2.4)

## 2019-06-22 MED ORDER — METOPROLOL TARTRATE 25 MG PO TABS
25.0000 mg | ORAL_TABLET | Freq: Two times a day (BID) | ORAL | Status: DC
  Administered 2019-06-22 – 2019-06-27 (×11): 25 mg via ORAL
  Filled 2019-06-22 (×11): qty 1

## 2019-06-22 MED ORDER — VITAMIN C 500 MG PO TABS
250.0000 mg | ORAL_TABLET | Freq: Two times a day (BID) | ORAL | Status: DC
  Administered 2019-06-22 – 2019-06-27 (×10): 250 mg via ORAL
  Filled 2019-06-22 (×10): qty 1

## 2019-06-22 MED ORDER — HALOPERIDOL LACTATE 5 MG/ML IJ SOLN
2.0000 mg | Freq: Once | INTRAMUSCULAR | Status: AC
  Administered 2019-06-23: 2 mg via INTRAMUSCULAR
  Filled 2019-06-22: qty 1

## 2019-06-22 MED ORDER — ENSURE ENLIVE PO LIQD
237.0000 mL | Freq: Two times a day (BID) | ORAL | Status: DC
  Administered 2019-06-23 – 2019-06-27 (×9): 237 mL via ORAL

## 2019-06-22 MED ORDER — MAGNESIUM SULFATE 2 GM/50ML IV SOLN
2.0000 g | Freq: Once | INTRAVENOUS | Status: AC
  Administered 2019-06-22: 2 g via INTRAVENOUS
  Filled 2019-06-22: qty 50

## 2019-06-22 NOTE — Progress Notes (Signed)
Initial Nutrition Assessment  DOCUMENTATION CODES:   Not applicable  INTERVENTION:  -48 hr calorie count initiated; will follow up on Monday -Updated Health Touch with patient preferences -Encouraged po intake  -Ensure Enlive po BID, each supplement provides 350 kcal and 20 grams of protein -Vitamin C 250 mg po BID  Continue MVI, Vitamin B1, Folic acid  NUTRITION DIAGNOSIS:   Inadequate oral intake related to social / environmental circumstances, poor appetite(history of EtOH abuse; intellectual disability) as evidenced by mild muscle depletion, mild fat depletion, other (comment)(30% avg po intake of last 8 documented meals).  GOAL:   Patient will meet greater than or equal to 90% of their needs  MONITOR:   PO intake, I & O's, Skin, Supplement acceptance, Labs, Weight trends  REASON FOR ASSESSMENT:   Consult Assessment of nutrition requirement/status, Poor PO, Calorie Count  ASSESSMENT:  58 year old male admitted with acute renal failure and alcohol associated pancreatitis. Past medical history significant for alcohol use and intellectual disability who presents to ED for evaluation of nausea, vomiting, diarrhea, generalized weakness, and shakes over the past several days.  11/3 Intubated; CV cath 11/5 Extubated 11/10 Dialysis catheter removed  Per chart review, patient noted to be very impulsive pulling out lines and required sedation with Precedex to prevent inadvertently pulling out dialysis catheter. Patient required intubation and CRRT in the setting of rhabdomyolysis and acute renal failure.  Patient with untouched dinner tray at bedside at RD visit, pt stated "I don't want none" when RD asked if he had eaten reports that he doed not like the food; he had consumed half of Ensure. RD inquired about patient preferences; he recalls liking cornflakes with banana, steak, meatloaf, pinto beans, cornbread, mashed potatoes, pudding, and Mt. Dew. Patient noted with green beans,  mashed potatoes, and meatloaf for dinner. RD encouraged pt to eat and reported to pt that his dinner tray consisted of all things he recalled as liking. Patient repeated that he did not want anything. Currently eating 30% of last 8 recorded meals.   Patient is at increased risk for malnutrition; mild/moderate fat and muscle depletions noted on exam. No weight history available for review and unable to obtain detailed nutrition history from patient due to intellectual disabilities; home daily intake suspected inadequate given history of alcohol abuse. Calorie count has been initiated, will provide Ensure BID and have updated Health Touch with patient preferences in efforts to encourage po intake.   Medications reviewed and include: Folic acid, MVI, Vitamin B1 Labs: Phosphorus 5 (H), Cr 2.33 (H), BUN 33 (H)    NUTRITION - FOCUSED PHYSICAL EXAM:   Most Recent Value  Orbital Region  Moderate depletion  Upper Arm Region  Unable to assess  Thoracic and Lumbar Region  Unable to assess  Buccal Region  Mild depletion  Temple Region  Mild depletion  Clavicle Bone Region  Mild depletion  Clavicle and Acromion Bone Region  No depletion  Scapular Bone Region  Unable to assess  Dorsal Hand  Mild depletion  Patellar Region  Moderate depletion  Anterior Thigh Region  Unable to assess  Posterior Calf Region  Unable to assess [moderate pitting edema to BLE]  Edema (RD Assessment)  Moderate [BLE]  Hair  Reviewed  Eyes  Reviewed  Mouth  Reviewed  Skin  Reviewed [dry, ecchymosis,  RLE]  Nails  Reviewed       Diet Order:   Diet Order            DIET  DYS 2 Room service appropriate? Yes with Assist; Fluid consistency: Thin  Diet effective now              EDUCATION NEEDS:   Not appropriate for education at this time  Skin:  Skin Assessment: Reviewed RN Assessment  Last BM:  11/11 (type 5;brown;lg)  Height:   Ht Readings from Last 1 Encounters:  06/11/19 6\' 4"  (1.93 m)    Weight:    Wt Readings from Last 1 Encounters:  06/21/19 93.4 kg    Ideal Body Weight:  91.8 kg  BMI:  Body mass index is 25.08 kg/m.  Estimated Nutritional Needs:   Kcal:  2250-2430 (MSJ 1.2-1.3)  Protein:  113-121  Fluid:  >/= 2.2 L/day   Lajuan Lines, RD, LDN Clinical Nutrition Office [phone] After Hours/Weekend Pager: [phone]

## 2019-06-22 NOTE — Plan of Care (Signed)
  Problem: Safety: Goal: Ability to remain free from injury will improve Outcome: Progressing  Moved closer to desk, in low bed

## 2019-06-22 NOTE — NC FL2 (Signed)
[place] LEVEL OF CARE SCREENING TOOL     IDENTIFICATION  Patient Name: Martin Diaz Birthdate: February 23, 1961 Sex: male Admission Date (Current Location): 06/10/2019  [place] and [place] Number:  Selena Lesser [id number] Facility and Address:  [facility], [address] [place], [place] [address]      Provider Number: [id number]  Attending Physician Name and Address:  Eugenie Filler, MD  Relative Name and Phone Number:  [name]   [phone] or [name]   [phone]    Current Level of Care: Hospital Recommended Level of Care: [place] Prior Approval Number:    Date Approved/Denied:   PASRR Number: [id number]  Discharge Plan: SNF    Current Diagnoses: Patient Active Problem List   Diagnosis Date Noted  . Metabolic acidosis   . Non-traumatic rhabdomyolysis   . Acute renal failure (ARF) ([place]) 06/10/2019  . Pancreatitis, alcoholic, acute 123456  . Alcohol use 06/10/2019  . Hypocalcemia 06/10/2019    Orientation RESPIRATION BLADDER Height & Weight     Self  Normal Continent Weight: 206 lb (93.4 kg) Height:  6\' 4"  (193 cm)  BEHAVIORAL SYMPTOMS/MOOD NEUROLOGICAL BOWEL NUTRITION STATUS      Continent Diet(Dysphagia 2)  AMBULATORY STATUS COMMUNICATION OF NEEDS Skin   Limited Assist Verbally Normal                       Personal Care Assistance Level of Assistance  Bathing, Dressing, Feeding Bathing Assistance: Limited assistance Feeding assistance: Independent Dressing Assistance: Limited assistance     Functional Limitations Info  Sight, Speech, Hearing Sight Info: Adequate Hearing Info: Adequate Speech Info: Adequate    SPECIAL CARE FACTORS FREQUENCY  PT (By licensed PT), OT (By licensed OT), Speech therapy     PT Frequency: Minimum 5x a week OT Frequency: Minimum 5x a week     Speech Therapy Frequency: Minimum 2x a week      Contractures Contractures Info: Not  present    Additional Factors Info  Code Status, Allergies Code Status Info: Full Code Allergies Info: NKA           Current Medications (06/22/2019):  This is the current hospital active medication list Current Facility-Administered Medications  Medication Dose Route Frequency Provider Last Rate Last Dose  . 0.9 %  sodium chloride infusion   Intravenous PRN Awilda Bill, NP 10 mL/hr at 06/17/19 2000    . ALPRAZolam Duanne Moron) tablet 1 mg  1 mg Oral TID PRN Ottie Glazier, MD   1 mg at 06/20/19 0309  . amLODipine (NORVASC) tablet 10 mg  10 mg Oral Daily Eugenie Filler, MD   10 mg at 06/22/19 1027  . cloNIDine (CATAPRES - Dosed in mg/24 hr) patch 0.2 mg  0.2 mg Transdermal Weekly Ottie Glazier, MD   0.2 mg at 06/17/19 1204  . folic acid (FOLVITE) tablet 1 mg  1 mg Oral Daily Kareen, Gair, RPH   1 mg at 06/22/19 1027  . heparin injection 5,000 Units  5,000 Units Subcutaneous Q8H Lenore Cordia, MD   5,000 Units at 06/20/19 1359  . hydrALAZINE (APRESOLINE) injection 10 mg  10 mg Intravenous Q4H PRN Merlene Laughter F, NP   10 mg at 06/21/19 1743  . ipratropium-albuterol (DUONEB) 0.5-2.5 (3) MG/3ML nebulizer solution 3 mL  3 mL Nebulization Q4H PRN Ottie Glazier, MD      . labetalol (NORMODYNE) injection 10 mg  10 mg Intravenous Q4H PRN Rosana Hoes,  Guinevere Scarlet, NP   10 mg at 06/21/19 2035  . magnesium sulfate IVPB 2 g 50 mL  2 g Intravenous Once Eugenie Filler, MD      . metoprolol tartrate (LOPRESSOR) tablet 25 mg  25 mg Oral BID Eugenie Filler, MD   25 mg at 06/22/19 1027  . multivitamin with minerals tablet 1 tablet  1 tablet Oral Daily Orrie, Rampley, RPH   1 tablet at 06/22/19 1027  . ondansetron (ZOFRAN) tablet 4 mg  4 mg Oral Q6H PRN Lenore Cordia, MD       Or  . ondansetron (ZOFRAN) injection 4 mg  4 mg Intravenous Q6H PRN Zada Finders R, MD      . sodium chloride flush (NS) 0.9 % injection 3 mL  3 mL Intravenous Q12H Lenore Cordia, MD   3 mL at 06/21/19 2041   . thiamine (VITAMIN B-1) tablet 100 mg  100 mg Oral Daily Tracyn, Borghese, RPH   100 mg at 06/22/19 1027     Discharge Medications: Please see discharge summary for a list of discharge medications.  Relevant Imaging Results:  Relevant Lab Results:   Additional Information SSN [id number]  Ross Ludwig, LCSW

## 2019-06-22 NOTE — Progress Notes (Signed)
PROGRESS NOTE    Martin Diaz  DUK:[id number] DOB: 1960/12/12 DOA: 06/10/2019 PCP: Patient, No Pcp Per   Brief Narrative:  Martin Diaz is a 58 y.o. M with a history of ETOH abuse and intellectual disability (lives in a boarding house, Aunt/Uncle guardian) admitted 11/1 with reports of several days of N/V/D, shaking and weakness.    In the ER, Cr 11.56, BUN 188.  HCO3 17, AST 450 / ALT 237, alk phos 77, lipase 339. RUQ [place] negative for gallstones or hepatobiliary abnormality.  Started IV fluids for rhabdo, AKI.  Renal US negative for obstruction. Developed worsening mental status and intubated for airway protection   Assessment & Plan:   Principal Problem:   Acute renal failure (ARF) (HCC) Active Problems:   Pancreatitis, alcoholic, acute   Alcohol use   Hypocalcemia   Non-traumatic rhabdomyolysis   Metabolic acidosis  1 acute renal failure/rhabdomyolysis/metabolic acidosis/left renal cyst (baseline creatinine 0.8) Acute renal failure likely secondary to a prerenal azotemia secondary to volume depletion and rhabdomyolysis.  Patient noted to have a CK level of 24,000 on admission.  Renal ultrasound which was done was negative for hydronephrosis.  Nephrology was consulted who are following the patient.  Patient was started on CRRT and subsequently transition to IHD.  Last HD was 06/14/2019.  Hemodialysis catheter to be removed per nephrology. Patient with a urine output recorded of 1.950 L over the past 24 hours.  Creatinine slowly trending down and at 2.33 from 3.34 (06/20/2019) from 11.56 on admission.  Nephrology following and appreciate their input and recommendations.  2.  Metabolic acidosis Likely secondary to problem #1.  Resolved on bicarb drip.  Bicarb drip has been discontinued.  Follow.  3.  Agitated delirium/baseline cognitive delay Felt likely secondary to alcohol withdrawal or ICU delirium superimposed on the baseline intellectual disability.  Patient noted per prior  physician to be very impulsive pulling out lines and required sedation with Precedex to prevent inadvertently pulling out his dialysis catheter.  Precedex has been discontinued.  Patient currently on clonidine.  Follow.  4.  Acute respiratory insufficiency in the setting of AMS/possible aspiration Patient was intubated for airway protection in the setting of rhabdomyolysis and acute renal failure.  Patient extubated on 06/14/2019.  Patient was treated empirically for possible aspiration pneumonia completed course of antibiotic treatment.  Patient currently afebrile.  Patient is not hypoxic, with sats of 97% on room air.  No further antibiotics needed at this time.  5.  Hypertension Blood pressure still elevated.  Continue clonidine patch.  Continue Norvasc at 10 mg daily.  Will place on Lopressor 25 mg twice daily for better blood pressure control.  Continue IV labetalol and hydralazine as needed.    6.  Hypocalcemia Resolved.  7.  Pancreatitis Lipase noted to be 339 on admission.  Patient history of alcohol abuse.  Patient required Precedex drips however currently off Precedex drip.  Tolerating oral intake.  Resolved.  8.  History of alcohol abuse Prior MD felt patient was likely being given alcohol to him by his family members or friends when he was out of his group home.  Patient unable to quantify amount of alcohol intake likely due to baseline cognitive impairment.  Continue thiamine, folic acid, multivitamin.  Follow.   DVT prophylaxis: Heparin Code Status: Full Family Communication: No family at bedside. Disposition Plan: SNF   Consultants:   Nephrology: Dr. Holley Raring 06/11/2019  PCCM: Dr Lanney Gins 06/12/2019  Procedures:  Renal US 11/1 >> medical renal disease of  both kidneys, exophytic indeterminate nodule lateral aspect of the upper to mid left kidney 2.2 cm (query solid or complex cystic lesion, MR imaging recommended) ETOH 11/1 >> negative  [place] ABD Limited 11/1 >> no evidence of  gallstones or hepatobiliary abnormality   Antimicrobials:   Azithromycin 06/12/2019>>>> 06/16/2019  IV Rocephin 06/12/2019>>>>> 06/16/2019   Subjective: Patient sleeping.  Arousable however drifts back to sleep.  Denies any chest pain or shortness of breath.   Objective: Vitals:   06/22/19 0421 06/22/19 0822 06/22/19 0835 06/22/19 1616  BP: (!) 180/95 (!) 180/84 (!) 180/93 (!) 161/92  Pulse: 82 81 75 77  Resp: 20  19 19   Temp: 98.3 F (36.8 C) 98.9 F (37.2 C) 98.6 F (37 C) 98.3 F (36.8 C)  TempSrc: Oral Oral Oral   SpO2: 100% 100% 99% 98%  Weight:      Height:        Intake/Output Summary (Last 24 hours) at 06/22/2019 1829 Last data filed at 06/22/2019 1700 Gross per 24 hour  Intake 0 ml  Output 2000 ml  Net -2000 ml   Filed Weights   06/18/19 0500 06/19/19 0500 06/21/19 0429  Weight: 96 kg 92.4 kg 93.4 kg    Examination:  General exam: NAD Respiratory system: Clear to auscultation bilaterally anterior lung fields.  No wheezes, no crackles, no rhonchi.  Normal respiratory effort. Cardiovascular system: RRR no murmurs rubs or gallops.  No JVD.  No lower extremity edema.  Gastrointestinal system: Abdomen is soft, mildly distended, positive bowel sounds.  No rebound.  No guarding.  Central nervous system: Alert and oriented. No focal neurological deficits. Extremities: Symmetric 5 x 5 power. Skin: No rashes, lesions or ulcers Psychiatry: Judgement and insight appear poor. Mood & affect appropriate.     Data Reviewed: I have personally reviewed following labs and imaging studies  CBC: Recent Labs  Lab 06/16/19 0346 06/18/19 0940 06/19/19 0505 06/20/19 0619 06/22/19 0704  WBC 5.0 6.9 6.9 7.7 5.9  NEUTROABS  --   --  4.7 5.6  --   HGB 11.8* 9.3* 9.7* 10.2* 10.5*  HCT 35.5* 28.1* 30.3* 30.7* 31.4*  MCV 84.9 85.4 88.1 86.0 84.9  PLT 172 175 217 254 924   Basic Metabolic Panel: Recent Labs  Lab 06/17/19 0450 06/18/19 0940 06/19/19 0505 06/20/19  0619 06/21/19 1805 06/22/19 0704  NA 140 139 141 142  --  142  K 3.6 3.6 3.9 4.0  --  3.9  CL 105 104 109 109  --  109  CO2 25 26 25 24   --  24  GLUCOSE 148* 106* 129* 100*  --  108*  BUN 72* 65* 62* 50*  --  33*  CREATININE 4.62* 4.25* 3.79* 3.34*  --  2.33*  CALCIUM 8.1* 8.2* 8.5* 8.5*  --  9.2  MG 2.1 2.1 2.1 2.0 1.9 1.8  PHOS 5.3* 5.6* 5.6* 4.9*  --  5.0*   GFR: Estimated Creatinine Clearance: 43.5 mL/min (A) (by C-G formula based on SCr of 2.33 mg/dL (H)). Liver Function Tests: Recent Labs  Lab 06/16/19 0346 06/17/19 0450 06/18/19 0940 06/19/19 0505 06/20/19 0619 06/22/19 0704  AST 81* 67*  --   --   --   --   ALT 80* 72*  --   --   --   --   ALKPHOS 75 75  --   --   --   --   BILITOT 1.2 0.7  --   --   --   --  PROT 6.5 6.1*  --   --   --   --   ALBUMIN 2.9*  2.8* 2.7*  2.8* 2.6* 2.7* 2.9* 3.1*   Recent Labs  Lab 06/16/19 0346  LIPASE 74*   Recent Labs  Lab 06/16/19 0346  AMMONIA 21   Coagulation Profile: No results for input(s): INR, PROTIME in the last 168 hours. Cardiac Enzymes: Recent Labs  Lab 06/21/19 1805  CKTOTAL 200   BNP (last 3 results) No results for input(s): PROBNP in the last 8760 hours. HbA1C: No results for input(s): HGBA1C in the last 72 hours. CBG: Recent Labs  Lab 06/22/19 0825  GLUCAP 88   Lipid Profile: No results for input(s): CHOL, HDL, LDLCALC, TRIG, CHOLHDL, LDLDIRECT in the last 72 hours. Thyroid Function Tests: No results for input(s): TSH, T4TOTAL, FREET4, T3FREE, THYROIDAB in the last 72 hours. Anemia Panel: No results for input(s): VITAMINB12, FOLATE, FERRITIN, TIBC, IRON, RETICCTPCT in the last 72 hours. Sepsis Labs: No results for input(s): PROCALCITON, LATICACIDVEN in the last 168 hours.  Recent Results (from the past 240 hour(s))  Culture, respiratory (non-expectorated)     Status: None   Collection Time: 06/13/19 12:10 AM   Specimen: Tracheal Aspirate; Respiratory  Result Value Ref Range Status    Specimen Description   Final    TRACHEAL ASPIRATE Performed at [facility], [address] [place], [place] [address]    Special Requests   Final    NONE Performed at [facility], [place], [place] [address]    Gram Stain   Final    RARE WBC PRESENT, PREDOMINANTLY PMN MODERATE GRAM POSITIVE COCCI IN PAIRS IN CLUSTERS FEW GRAM NEGATIVE RODS    Culture   Final    FEW Consistent with normal respiratory flora. Performed at [facility], [place] [address], [place], [place] [address]    Report Status 06/15/2019 FINAL  Final         Radiology Studies: No results found.      Scheduled Meds: . amLODipine  10 mg Oral Daily  . cloNIDine  0.2 mg Transdermal Weekly  . folic acid  1 mg Oral Daily  . heparin  5,000 Units Subcutaneous Q8H  . metoprolol tartrate  25 mg Oral BID  . multivitamin with minerals  1 tablet Oral Daily  . sodium chloride flush  3 mL Intravenous Q12H  . thiamine  100 mg Oral Daily   Continuous Infusions: . sodium chloride 250 mL (06/22/19 1033)     LOS: 12 days    Time spent: 35 minutes    Irine Seal, MD [facility]  If 7PM-7AM, please contact night-coverage [email or web address] 06/22/2019, 6:29 PM

## 2019-06-22 NOTE — Progress Notes (Signed)
Physical Therapy Treatment Patient Details Name: Martin Diaz MRN: [id number] DOB: 11/21/1960 Today's Date: 06/22/2019    History of Present Illness Pt is a 58 y.o. male presenting to hospital 06/10/19 with abdominal pain, generalized decline, generalized weakness, shakiness, vomiting, and diarrhea.  Pt admitted with acute renal failure, pancreatitis, hypocalcemia, and rhabdomyolysis.  Intubated 11/3 and extubated 11/5.  R IJ temporary HD catheter placed 11/3 and removed 11/10.  PMH includes alcohol use, cognitive impairment (pt chart pt's uncle reports pt functions at 24 y.o. level).    PT Comments    Patient received in bed, looking very lethargic. Able to tell me where he is. Difficult to understand at times. Agrees to LE strengthening exercises, but only does a few of each then declines further exercise. Requires max assist for bed mobility. Patient limited by weakness. He will continue to benefit from skilled PT to improve strength and functional independence with mobility.        Follow Up Recommendations  SNF     Equipment Recommendations  Rolling walker with 5" wheels;3in1 (PT);Wheelchair (measurements PT);Wheelchair cushion (measurements PT)    Recommendations for Other Services       Precautions / Restrictions Precautions Precautions: Fall Precaution Comments: aspiration Restrictions Weight Bearing Restrictions: No    Mobility  Bed Mobility Overal bed mobility: Needs Assistance Bed Mobility: Supine to Sit     Supine to sit: Max assist;HOB elevated Sit to supine: Max assist;+2 for physical assistance   General bed mobility comments: Limited initiation of movement, slow to respond.  Transfers                 General transfer comment: declines this visit  Ambulation/Gait             General Gait Details: unable   Stairs             Wheelchair Mobility    Modified Rankin (Stroke Patients Only)       Balance Overall balance  assessment: Needs assistance Sitting-balance support: Feet supported;Bilateral upper extremity supported Sitting balance-Leahy Scale: Fair                                      Cognition Arousal/Alertness: Awake/alert Behavior During Therapy: Flat affect Overall Cognitive Status: No family/caregiver present to determine baseline cognitive functioning                                 General Comments: Oriented to person and place.  Increased time to respond and initiate movement.  Inconsistently following 1 step commands.      Exercises Other Exercises Other Exercises: B LE exercses: with assistance and increased time: AP, SLR, heel slides, x 5 reps each. declines further exercises    General Comments        Pertinent Vitals/Pain Pain Assessment: No/denies pain    Home Living                      Prior Function            PT Goals (current goals can now be found in the care plan section) Acute Rehab PT Goals Patient Stated Goal: to go home PT Goal Formulation: With patient Time For Goal Achievement: 07/04/19 Potential to Achieve Goals: Fair Progress towards PT goals: Progressing toward goals    Frequency  Min 2X/week      PT Plan Current plan remains appropriate    Co-evaluation              AM-PAC PT "6 Clicks" Mobility   Outcome Measure  Help needed turning from your back to your side while in a flat bed without using bedrails?: A Lot Help needed moving from lying on your back to sitting on the side of a flat bed without using bedrails?: A Lot Help needed moving to and from a bed to a chair (including a wheelchair)?: Total Help needed standing up from a chair using your arms (e.g., wheelchair or bedside chair)?: Total Help needed to walk in hospital room?: Total Help needed climbing 3-5 steps with a railing? : Total 6 Click Score: 8    End of Session   Activity Tolerance: Patient limited by fatigue Patient  left: in bed;with call bell/phone within reach;with bed alarm set Nurse Communication: Mobility status PT Visit Diagnosis: Other abnormalities of gait and mobility (R26.89);Difficulty in walking, not elsewhere classified (R26.2);Muscle weakness (generalized) (M62.81)     Time: 1040-1055 PT Time Calculation (min) (ACUTE ONLY): 15 min  Charges:  $Therapeutic Exercise: 8-22 mins                     Wanona Stare, PT, GCS 06/22/19,11:33 AM

## 2019-06-22 NOTE — Progress Notes (Signed)
[facility]  ROUNDING NOTE   Subjective:   Not able to give history this morning. Laying in bed  Creatinine 2.33 (3.34)   Objective:  Vital signs in last 24 hours:  Temp:  [97.9 F (36.6 C)-98.9 F (37.2 C)] 98.6 F (37 C) (11/13 0835) Pulse Rate:  [75-107] 75 (11/13 0835) Resp:  [16-20] 19 (11/13 0835) BP: (130-196)/(80-103) 180/93 (11/13 0835) SpO2:  [99 %-100 %] 99 % (11/13 0835)  Weight change:  Filed Weights   06/18/19 0500 06/19/19 0500 06/21/19 0429  Weight: 96 kg 92.4 kg 93.4 kg    Intake/Output: I/O last 3 completed shifts: In: 240 [P.O.:240] Out: 2475 [Urine:2475]   Intake/Output this shift:  No intake/output data recorded.  Physical Exam: General:  laying in bed   Head: Normocephalic atraumatic  Eyes: Anicteric  Neck: Supple, trachea midline  Lungs:  Clear to auscultation   Heart: regular  Abdomen:  Soft, nontender, bowel sounds present  Extremities: No peripheral edema.  Neurologic: Not sedated. Able to follow simple commands  Skin: No lesions        Basic Metabolic Panel: Recent Labs  Lab 06/17/19 0450 06/18/19 0940 06/19/19 0505 06/20/19 0619 06/21/19 1805 06/22/19 0704  NA 140 139 141 142  --  142  K 3.6 3.6 3.9 4.0  --  3.9  CL 105 104 109 109  --  109  CO2 25 26 25 24   --  24  GLUCOSE 148* 106* 129* 100*  --  108*  BUN 72* 65* 62* 50*  --  33*  CREATININE 4.62* 4.25* 3.79* 3.34*  --  2.33*  CALCIUM 8.1* 8.2* 8.5* 8.5*  --  9.2  MG 2.1 2.1 2.1 2.0 1.9 1.8  PHOS 5.3* 5.6* 5.6* 4.9*  --  5.0*    Liver Function Tests: Recent Labs  Lab 06/16/19 0346 06/17/19 0450 06/18/19 0940 06/19/19 0505 06/20/19 0619 06/22/19 0704  AST 81* 67*  --   --   --   --   ALT 80* 72*  --   --   --   --   ALKPHOS 75 75  --   --   --   --   BILITOT 1.2 0.7  --   --   --   --   PROT 6.5 6.1*  --   --   --   --   ALBUMIN 2.9*  2.8* 2.7*  2.8* 2.6* 2.7* 2.9* 3.1*   Recent Labs  Lab 06/16/19 0346  LIPASE 74*   Recent Labs   Lab 06/16/19 0346  AMMONIA 21    CBC: Recent Labs  Lab 06/16/19 0346 06/18/19 0940 06/19/19 0505 06/20/19 0619 06/22/19 0704  WBC 5.0 6.9 6.9 7.7 5.9  NEUTROABS  --   --  4.7 5.6  --   HGB 11.8* 9.3* 9.7* 10.2* 10.5*  HCT 35.5* 28.1* 30.3* 30.7* 31.4*  MCV 84.9 85.4 88.1 86.0 84.9  PLT 172 175 217 254 288    Cardiac Enzymes: Recent Labs  Lab 06/21/19 1805  CKTOTAL 200    BNP: Invalid input(s): POCBNP  CBG: Recent Labs  Lab 06/22/19 0825  GLUCAP 21    Microbiology: Results for orders placed or performed during the hospital encounter of 06/10/19  SARS Coronavirus 2 by RT PCR (hospital order, performed in [place] hospital lab)     Status: None   Collection Time: 06/10/19 10:02 PM  Result Value Ref Range Status   SARS Coronavirus 2 NEGATIVE NEGATIVE Final  Comment: (NOTE) If result is NEGATIVE SARS-CoV-2 target nucleic acids are NOT DETECTED. The SARS-CoV-2 RNA is generally detectable in upper and lower  respiratory specimens during the acute phase of infection. The lowest  concentration of SARS-CoV-2 viral copies this assay can detect is 250  copies / mL. A negative result does not preclude SARS-CoV-2 infection  and should not be used as the sole basis for treatment or other  patient management decisions.  A negative result may occur with  improper specimen collection / handling, submission of specimen other  than nasopharyngeal swab, presence of viral mutation(s) within the  areas targeted by this assay, and inadequate number of viral copies  (<250 copies / mL). A negative result must be combined with clinical  observations, patient history, and epidemiological information. If result is POSITIVE SARS-CoV-2 target nucleic acids are DETECTED. The SARS-CoV-2 RNA is generally detectable in upper and lower  respiratory specimens dur ing the acute phase of infection.  Positive  results are indicative of active infection with SARS-CoV-2.  Clinical   correlation with patient history and other diagnostic information is  necessary to determine patient infection status.  Positive results do  not rule out bacterial infection or co-infection with other viruses. If result is PRESUMPTIVE POSTIVE SARS-CoV-2 nucleic acids MAY BE PRESENT.   A presumptive positive result was obtained on the submitted specimen  and confirmed on repeat testing.  While 2019 novel coronavirus  (SARS-CoV-2) nucleic acids may be present in the submitted sample  additional confirmatory testing may be necessary for epidemiological  and / or clinical management purposes  to differentiate between  SARS-CoV-2 and other Sarbecovirus currently known to infect humans.  If clinically indicated additional testing with an alternate test  methodology [phone]) is advised. The SARS-CoV-2 RNA is generally  detectable in upper and lower respiratory sp ecimens during the acute  phase of infection. The expected result is Negative. Fact Sheet for Patients:  StrictlyIdeas.no Fact Sheet for Healthcare Providers: BankingDealers.co.za This test is not yet approved or cleared by the [place] FDA and has been authorized for detection and/or diagnosis of SARS-CoV-2 by FDA under an Emergency Use Authorization (EUA).  This EUA will remain in effect (meaning this test can be used) for the duration of the COVID-19 declaration under Section 564(b)(1) of the Act, 21 U.S.C. section 360bbb-3(b)(1), unless the authorization is terminated or revoked sooner. Performed at [facility], [place] [address], [place], [place] [address]   MRSA PCR Screening     Status: None   Collection Time: 06/11/19 12:30 PM   Specimen: Nasopharyngeal  Result Value Ref Range Status   MRSA by PCR NEGATIVE NEGATIVE Final    Comment:        The GeneXpert MRSA Assay (FDA approved for NASAL specimens only), is one component of a comprehensive MRSA  colonization surveillance program. It is not intended to diagnose MRSA infection nor to guide or monitor treatment for MRSA infections. Performed at [facility], [address] [place], [place] [address]   CULTURE, BLOOD (ROUTINE X 2) w Reflex to ID Panel     Status: None   Collection Time: 06/12/19  1:34 PM   Specimen: BLOOD  Result Value Ref Range Status   Specimen Description BLOOD BLOOD RIGHT HAND  Final   Special Requests   Final    BOTTLES DRAWN AEROBIC AND ANAEROBIC Blood Culture adequate volume   Culture   Final    NO GROWTH 5 DAYS Performed at [facility], [address] [place], [place] [address]    Report Status 06/17/2019 FINAL  Final  CULTURE, BLOOD (ROUTINE X 2) w Reflex to ID Panel     Status: None   Collection Time: 06/12/19  1:35 PM   Specimen: BLOOD  Result Value Ref Range Status   Specimen Description BLOOD BLOOD LEFT FOREARM  Final   Special Requests   Final    BOTTLES DRAWN AEROBIC AND ANAEROBIC Blood Culture adequate volume   Culture   Final    NO GROWTH 5 DAYS Performed at [facility], [address], [place], [place] [address]    Report Status 06/17/2019 FINAL  Final  Culture, respiratory (non-expectorated)     Status: None   Collection Time: 06/13/19 12:10 AM   Specimen: Tracheal Aspirate; Respiratory  Result Value Ref Range Status   Specimen Description   Final    TRACHEAL ASPIRATE Performed at [facility], [address] [place], [place] [address]    Special Requests   Final    NONE Performed at [facility], [place], [place] [address]    Gram Stain   Final    RARE WBC PRESENT, PREDOMINANTLY PMN MODERATE GRAM POSITIVE COCCI IN PAIRS IN CLUSTERS FEW GRAM NEGATIVE RODS    Culture   Final    FEW Consistent with normal respiratory flora. Performed at [facility], [place] [address] [place], [place] [address]    Report Status 06/15/2019 FINAL  Final    Coagulation  Studies: No results for input(s): LABPROT, INR in the last 72 hours.  Urinalysis: No results for input(s): COLORURINE, LABSPEC, PHURINE, GLUCOSEU, HGBUR, BILIRUBINUR, KETONESUR, PROTEINUR, UROBILINOGEN, NITRITE, LEUKOCYTESUR in the last 72 hours.  Invalid input(s): APPERANCEUR    Imaging: No results found.   Medications:   . sodium chloride 10 mL/hr at 06/17/19 2000  . magnesium sulfate bolus IVPB     . amLODipine  10 mg Oral Daily  . cloNIDine  0.2 mg Transdermal Weekly  . folic acid  1 mg Oral Daily  . heparin  5,000 Units Subcutaneous Q8H  . metoprolol tartrate  25 mg Oral BID  . multivitamin with minerals  1 tablet Oral Daily  . sodium chloride flush  3 mL Intravenous Q12H  . thiamine  100 mg Oral Daily   sodium chloride, ALPRAZolam, hydrALAZINE, ipratropium-albuterol, labetalol, ondansetron **OR** ondansetron (ZOFRAN) IV  Assessment/ Plan:  58 y.o.black male with alcohol abuse and educational disability who was admitted to [facility] on 06/10/2019 for evaluation of weakness, diarrhea, nausea, and vomiting.    1.  Acute kidney injury with metabolic acidosis suspected secondary to prolonged volume depletion (prerenal azotemia) and rhabdomyolysis.   Baseline creatinine of 0.78, normal GFR on 01/2018 Patient required continuous renal placement therapy  - nonoliguric urine output.  - Off IV fluids - Encourage PO intake  2.  Anemia with renal failure:   No indication for ESA. Hemoglobin 10.5  3. Hypertension: 180/93. Elevated. No home medications - started on metoprolol, amlodipine and clonidine patch this admission.    LOS: 12 Darcell Yacoub 11/13/202010:24 AM

## 2019-06-22 NOTE — TOC Initial Note (Signed)
Transition of Care [facility]) - Initial/Assessment Note    Patient Details  Name: Martin Diaz MRN: [id number] Date of Birth: Jun 20, 1961  Transition of Care [facility]) CM/SW Contact:    Ross Ludwig, LCSW Phone Number: 06/22/2019, 5:26 PM  Clinical Narrative:                  Patient is a 58 year old male who is alert and oriented x2.  Patient has some intellectual delay and lives in a boarding house.  Patient's legal guardian is his aunt, CSW was informed to contact her with any decision making.  According to patient's aunt, he has not been to snf for rehab, CSW explained how insurance will pay for stay, and what to expect.  Patient's aunt gave CSW permission to begin bed search in [name].  Expected Discharge Plan: Skilled Nursing Facility Barriers to Discharge: Continued Medical Work up   Patient Goals and CMS Choice Patient states their goals for this hospitalization and ongoing recovery are:: Patient plans to go to SNF then return after some rehab back to the boarding house. CMS Medicare.gov Compare Post Acute Care list provided to:: Patient Represenative (must comment) Choice offered to / list presented to : [place] / Guardian  Expected Discharge Plan and Services Expected Discharge Plan: [place] In-house Referral: Clinical Social Work   Post Acute Care Choice: [place] Living arrangements for the past 2 months: [place]                 DME Arranged: N/A         HH Arranged: NA          Prior Living Arrangements/Services Living arrangements for the past 2 months: [place] with:: Roommate Patient language and need for interpreter reviewed:: Yes Do you feel safe going back to the place where you live?: No   Patient's family feels he needs SNF for short term rehab, before he is able to return back to the boarding house.  Need for Family Participation in Patient Care: Yes (Comment) Care giver support system in  place?: Yes (comment)   Criminal Activity/Legal Involvement Pertinent to Current Situation/Hospitalization: No - Comment as needed  Activities of Daily Living      Permission Sought/Granted Permission sought to share information with : Family Supports Permission granted to share information with : Yes, Release of Information Signed, Yes, Verbal Permission Granted  Share Information with NAME: [name]   [phone] or [name]   [id number]  Permission granted to share info w AGENCY: SNF admissions        Emotional Assessment Appearance:: Appears stated age   Affect (typically observed): Accepting, Adaptable, Appropriate, Calm, Stable Orientation: : Oriented to Self, Oriented to Place Alcohol / Substance Use: Alcohol Use Psych Involvement: No (comment)  Admission diagnosis:  Renal failure [N19] Abdominal pain [R10.9] Acute liver failure without hepatic coma [K72.00] Alcohol-induced acute pancreatitis, unspecified complication status [id number] Patient Active Problem List   Diagnosis Date Noted  . Metabolic acidosis   . Non-traumatic rhabdomyolysis   . Acute renal failure (ARF) ([place]) 06/10/2019  . Pancreatitis, alcoholic, acute [id number]  . Alcohol use 06/10/2019  . Hypocalcemia 06/10/2019   PCP:  Patient, No Pcp Per Pharmacy:  No Pharmacies Listed    Social Determinants of Health (SDOH) Interventions    Readmission Risk Interventions No flowsheet data found.

## 2019-06-23 LAB — BASIC METABOLIC PANEL WITH GFR
Anion gap: 8 (ref 5–15)
BUN: 31 mg/dL — ABNORMAL HIGH (ref 6–20)
CO2: 25 mmol/L (ref 22–32)
Calcium: 9.8 mg/dL (ref 8.9–10.3)
Chloride: 108 mmol/L (ref 98–111)
Creatinine, Ser: 2.14 mg/dL — ABNORMAL HIGH (ref 0.61–1.24)
GFR calc Af Amer: 39 mL/min — ABNORMAL LOW
GFR calc non Af Amer: 33 mL/min — ABNORMAL LOW
Glucose, Bld: 111 mg/dL — ABNORMAL HIGH (ref 70–99)
Potassium: 3.7 mmol/L (ref 3.5–5.1)
Sodium: 141 mmol/L (ref 135–145)

## 2019-06-23 LAB — MAGNESIUM: Magnesium: 2.1 mg/dL (ref 1.7–2.4)

## 2019-06-23 NOTE — Plan of Care (Signed)
  Problem: Education: Goal: Knowledge of General Education information will improve Description Including pain rating scale, medication(s)/side effects and non-pharmacologic comfort measures Outcome: Progressing   Problem: Health Behavior/Discharge Planning: Goal: Ability to manage health-related needs will improve Outcome: Progressing   

## 2019-06-23 NOTE — Progress Notes (Signed)
Notified NP of pt with agitation, trying to get out of bed and stating that he wants to go home and that he was going home. Attempted to redirect pt, but was not able. Attempted to explain to pt the purpose and importance of having the monitor on and an IV. Pt refusing. Orders placed. Will continue to monitor and assess.

## 2019-06-23 NOTE — Progress Notes (Signed)
[facility]  ROUNDING NOTE   Subjective:   Creatinine 2.14 (2.33) (3.34)  Uncle at bedside.   Patient not able to answer questions.   Objective:  Vital signs in last 24 hours:  Temp:  [98.1 F (36.7 C)-98.6 F (37 C)] 98.3 F (36.8 C) (11/14 0257) Pulse Rate:  [75-84] 78 (11/14 0259) Resp:  [19] 19 (11/13 1616) BP: (155-180)/(92-95) 166/92 (11/14 0259) SpO2:  [98 %-100 %] 98 % (11/14 0259) Weight:  [90.6 kg] (P) 90.6 kg (11/14 0254)  Weight change:  Filed Weights   06/19/19 0500 06/21/19 0429 06/23/19 0254  Weight: 92.4 kg 93.4 kg (P) 90.6 kg    Intake/Output: I/O last 3 completed shifts: In: 0  Out: 2000 [Urine:2000]   Intake/Output this shift:  No intake/output data recorded.  Physical Exam: General:  laying in bed   Head: Normocephalic atraumatic  Eyes: Anicteric  Neck: Supple, trachea midline  Lungs:  Clear to auscultation   Heart: regular  Abdomen:  Soft, nontender, bowel sounds present  Extremities: No peripheral edema.  Neurologic: Not sedated. Able to follow simple commands  Skin: No lesions        Basic Metabolic Panel: Recent Labs  Lab 06/17/19 0450 06/18/19 0940 06/19/19 0505 06/20/19 0619 06/21/19 1805 06/22/19 0704 06/23/19 0603  NA 140 139 141 142  --  142 141  K 3.6 3.6 3.9 4.0  --  3.9 3.7  CL 105 104 109 109  --  109 108  CO2 25 26 25 24   --  24 25  GLUCOSE 148* 106* 129* 100*  --  108* 111*  BUN 72* 65* 62* 50*  --  33* 31*  CREATININE 4.62* 4.25* 3.79* 3.34*  --  2.33* 2.14*  CALCIUM 8.1* 8.2* 8.5* 8.5*  --  9.2 9.8  MG 2.1 2.1 2.1 2.0 1.9 1.8 2.1  PHOS 5.3* 5.6* 5.6* 4.9*  --  5.0*  --     Liver Function Tests: Recent Labs  Lab 06/17/19 0450 06/18/19 0940 06/19/19 0505 06/20/19 0619 06/22/19 0704  AST 67*  --   --   --   --   ALT 72*  --   --   --   --   ALKPHOS 75  --   --   --   --   BILITOT 0.7  --   --   --   --   PROT 6.1*  --   --   --   --   ALBUMIN 2.7*  2.8* 2.6* 2.7* 2.9* 3.1*   No  results for input(s): LIPASE, AMYLASE in the last 168 hours. No results for input(s): AMMONIA in the last 168 hours.  CBC: Recent Labs  Lab 06/18/19 0940 06/19/19 0505 06/20/19 0619 06/22/19 0704  WBC 6.9 6.9 7.7 5.9  NEUTROABS  --  4.7 5.6  --   HGB 9.3* 9.7* 10.2* 10.5*  HCT 28.1* 30.3* 30.7* 31.4*  MCV 85.4 88.1 86.0 84.9  PLT 175 217 254 288    Cardiac Enzymes: Recent Labs  Lab 06/21/19 1805  CKTOTAL 200    BNP: Invalid input(s): POCBNP  CBG: Recent Labs  Lab 06/22/19 0825  GLUCAP 28    Microbiology: Results for orders placed or performed during the hospital encounter of 06/10/19  SARS Coronavirus 2 by RT PCR (hospital order, performed in [place] hospital lab)     Status: None   Collection Time: 06/10/19 10:02 PM  Result Value Ref Range Status   SARS Coronavirus  2 NEGATIVE NEGATIVE Final    Comment: (NOTE) If result is NEGATIVE SARS-CoV-2 target nucleic acids are NOT DETECTED. The SARS-CoV-2 RNA is generally detectable in upper and lower  respiratory specimens during the acute phase of infection. The lowest  concentration of SARS-CoV-2 viral copies this assay can detect is 250  copies / mL. A negative result does not preclude SARS-CoV-2 infection  and should not be used as the sole basis for treatment or other  patient management decisions.  A negative result may occur with  improper specimen collection / handling, submission of specimen other  than nasopharyngeal swab, presence of viral mutation(s) within the  areas targeted by this assay, and inadequate number of viral copies  (<250 copies / mL). A negative result must be combined with clinical  observations, patient history, and epidemiological information. If result is POSITIVE SARS-CoV-2 target nucleic acids are DETECTED. The SARS-CoV-2 RNA is generally detectable in upper and lower  respiratory specimens dur ing the acute phase of infection.  Positive  results are indicative of active  infection with SARS-CoV-2.  Clinical  correlation with patient history and other diagnostic information is  necessary to determine patient infection status.  Positive results do  not rule out bacterial infection or co-infection with other viruses. If result is PRESUMPTIVE POSTIVE SARS-CoV-2 nucleic acids MAY BE PRESENT.   A presumptive positive result was obtained on the submitted specimen  and confirmed on repeat testing.  While 2019 novel coronavirus  (SARS-CoV-2) nucleic acids may be present in the submitted sample  additional confirmatory testing may be necessary for epidemiological  and / or clinical management purposes  to differentiate between  SARS-CoV-2 and other Sarbecovirus currently known to infect humans.  If clinically indicated additional testing with an alternate test  methodology [phone]) is advised. The SARS-CoV-2 RNA is generally  detectable in upper and lower respiratory sp ecimens during the acute  phase of infection. The expected result is Negative. Fact Sheet for Patients:  StrictlyIdeas.no Fact Sheet for Healthcare Providers: BankingDealers.co.za This test is not yet approved or cleared by the [place] FDA and has been authorized for detection and/or diagnosis of SARS-CoV-2 by FDA under an Emergency Use Authorization (EUA).  This EUA will remain in effect (meaning this test can be used) for the duration of the COVID-19 declaration under Section 564(b)(1) of the Act, 21 U.S.C. section 360bbb-3(b)(1), unless the authorization is terminated or revoked sooner. Performed at [facility], [place] [address] [place], [place] [address]   MRSA PCR Screening     Status: None   Collection Time: 06/11/19 12:30 PM   Specimen: Nasopharyngeal  Result Value Ref Range Status   MRSA by PCR NEGATIVE NEGATIVE Final    Comment:        The GeneXpert MRSA Assay (FDA approved for NASAL specimens only), is one component of  a comprehensive MRSA colonization surveillance program. It is not intended to diagnose MRSA infection nor to guide or monitor treatment for MRSA infections. Performed at [facility], [address] [place], [place] [address]   CULTURE, BLOOD (ROUTINE X 2) w Reflex to ID Panel     Status: None   Collection Time: 06/12/19  1:34 PM   Specimen: BLOOD  Result Value Ref Range Status   Specimen Description BLOOD BLOOD RIGHT HAND  Final   Special Requests   Final    BOTTLES DRAWN AEROBIC AND ANAEROBIC Blood Culture adequate volume   Culture   Final    NO  GROWTH 5 DAYS Performed at [facility], [address] [place], [place] [address]    Report Status 06/17/2019 FINAL  Final  CULTURE, BLOOD (ROUTINE X 2) w Reflex to ID Panel     Status: None   Collection Time: 06/12/19  1:35 PM   Specimen: BLOOD  Result Value Ref Range Status   Specimen Description BLOOD BLOOD LEFT FOREARM  Final   Special Requests   Final    BOTTLES DRAWN AEROBIC AND ANAEROBIC Blood Culture adequate volume   Culture   Final    NO GROWTH 5 DAYS Performed at [facility], [address] [place], [place] [address]    Report Status 06/17/2019 FINAL  Final  Culture, respiratory (non-expectorated)     Status: None   Collection Time: 06/13/19 12:10 AM   Specimen: Tracheal Aspirate; Respiratory  Result Value Ref Range Status   Specimen Description   Final    TRACHEAL ASPIRATE Performed at [facility], [address] [place], [place] [address]    Special Requests   Final    NONE Performed at [facility], [place], [place] [address]    Gram Stain   Final    RARE WBC PRESENT, PREDOMINANTLY PMN MODERATE GRAM POSITIVE COCCI IN PAIRS IN CLUSTERS FEW GRAM NEGATIVE RODS    Culture   Final    FEW Consistent with normal respiratory flora. Performed at [facility], [place] [address] [place], [place] [address]    Report Status 06/15/2019 FINAL   Final    Coagulation Studies: No results for input(s): LABPROT, INR in the last 72 hours.  Urinalysis: No results for input(s): COLORURINE, LABSPEC, PHURINE, GLUCOSEU, HGBUR, BILIRUBINUR, KETONESUR, PROTEINUR, UROBILINOGEN, NITRITE, LEUKOCYTESUR in the last 72 hours.  Invalid input(s): APPERANCEUR    Imaging: No results found.   Medications:    . amLODipine  10 mg Oral Daily  . cloNIDine  0.2 mg Transdermal Weekly  . feeding supplement (ENSURE ENLIVE)  237 mL Oral BID BM  . folic acid  1 mg Oral Daily  . heparin  5,000 Units Subcutaneous Q8H  . metoprolol tartrate  25 mg Oral BID  . multivitamin with minerals  1 tablet Oral Daily  . sodium chloride flush  3 mL Intravenous Q12H  . thiamine  100 mg Oral Daily  . vitamin C  250 mg Oral BID   ALPRAZolam, hydrALAZINE, ipratropium-albuterol, labetalol, ondansetron **OR** ondansetron (ZOFRAN) IV  Assessment/ Plan:  58 y.o.black male with alcohol abuse and educational disability who was admitted to [facility] on 06/10/2019 for evaluation of weakness, diarrhea, nausea, and vomiting.    1.  Acute kidney injury with metabolic acidosis suspected secondary to prolonged volume depletion (prerenal azotemia) and rhabdomyolysis.   Baseline creatinine of 0.78, normal GFR on 01/2018 Patient required continuous renal placement therapy  - nonoliguric urine output.  - Off IV fluids - Encourage PO intake  2.  Anemia with renal failure:   No indication for ESA. Hemoglobin 10.5  3. Hypertension: Elevated. No home medications - started on metoprolol, amlodipine and clonidine patch this admission.    LOS: 13 Rashad Auld 11/14/20208:24 AM

## 2019-06-23 NOTE — Plan of Care (Signed)
  Problem: Education: Goal: Knowledge of General Education information will improve Description: Including pain rating scale, medication(s)/side effects and non-pharmacologic comfort measures Outcome: Progressing   Problem: Safety: Goal: Ability to remain free from injury will improve Outcome: Progressing   

## 2019-06-23 NOTE — Progress Notes (Signed)
PROGRESS NOTE    Martin Diaz  BSJ:[id number] DOB: 1960/08/22 DOA: 06/10/2019 PCP: Patient, No Pcp Per   Brief Narrative:  Martin Diaz is a 58 y.o. M with a history of ETOH abuse and intellectual disability (lives in a boarding house, Aunt/Uncle guardian) admitted 11/1 with reports of several days of N/V/D, shaking and weakness.    In the ER, Cr 11.56, BUN 188.  HCO3 17, AST 450 / ALT 237, alk phos 77, lipase 339. RUQ [place] negative for gallstones or hepatobiliary abnormality.  Started IV fluids for rhabdo, AKI.  Renal US negative for obstruction. Developed worsening mental status and intubated for airway protection   Assessment & Plan:   Principal Problem:   Acute renal failure (ARF) (H[facility]) Active Problems:   Pancreatitis, alcoholic, acute   Alcohol use   Hypocalcemia   Non-traumatic rhabdomyolysis   Metabolic acidosis  1 acute renal failure/rhabdomyolysis/metabolic acidosis/left renal cyst (baseline creatinine 0.8) Acute renal failure likely secondary to a prerenal azotemia secondary to volume depletion and rhabdomyolysis.  Patient noted to have a CK level of 24,000 on admission.  Renal ultrasound which was done was negative for hydronephrosis.  Nephrology was consulted who are following the patient.  Patient was started on CRRT and subsequently transition to IHD.  Last HD was 06/14/2019.  Hemodialysis catheter to be removed per nephrology. Patient with a urine output recorded of 1.250 L over the past 24 hours.  Creatinine slowly trending down and at 2.14 from 2.33 from 3.34 (06/20/2019) from 11.56 on admission.  Nephrology following and appreciate their input and recommendations.  2.  Metabolic acidosis Likely secondary to problem #1.  Resolved on bicarb drip.  Bicarb drip has been discontinued.  Follow.  3.  Agitated delirium/baseline cognitive delay Felt likely secondary to alcohol withdrawal or ICU delirium superimposed on the baseline intellectual disability.  Patient noted per  prior physician to be very impulsive pulling out lines and required sedation with Precedex to prevent inadvertently pulling out his dialysis catheter.  Precedex has been discontinued.  Patient currently on clonidine.  Follow.  4.  Acute respiratory insufficiency in the setting of AMS/possible aspiration Patient was intubated for airway protection in the setting of rhabdomyolysis and acute renal failure.  Patient extubated on 06/14/2019.  Patient was treated empirically for possible aspiration pneumonia completed course of antibiotic treatment.  Patient currently afebrile.  Patient is not hypoxic, with sats of 99% on room air.  No further antibiotics needed at this time.  5.  Hypertension Blood pressure still elevated.  Continue clonidine patch.  Lopressor increased to 25 mg twice daily yesterday which we will continue for now.  Continue Norvasc at 10 mg daily.  IV labetalol and hydralazine as needed.   6.  Hypocalcemia Resolved.  7.  Pancreatitis Lipase noted to be 339 on admission.  Patient history of alcohol abuse.  Patient required Precedex drips however currently off Precedex drip.  Tolerating oral intake.  Resolved.  8.  History of alcohol abuse Prior MD felt patient was likely being given alcohol to him by his family members or friends when he was out of his group home.  Patient unable to quantify amount of alcohol intake likely due to baseline cognitive impairment.  Continue thiamine, folic acid, multivitamin.  Follow.   DVT prophylaxis: Heparin Code Status: Full Family Communication: No family at bedside. Disposition Plan: SNF when clinically improved.  Resolution of acute renal failure.   Consultants:   Nephrology: Dr. Holley Raring 06/11/2019  PCCM: Dr Lanney Gins 06/12/2019  Procedures:  Renal US 11/1 >> medical renal disease of both kidneys, exophytic indeterminate nodule lateral aspect of the upper to mid left kidney 2.2 cm (query solid or complex cystic lesion, MR imaging  recommended) ETOH 11/1 >> negative  [place] ABD Limited 11/1 >> no evidence of gallstones or hepatobiliary abnormality   Antimicrobials:   Azithromycin 06/12/2019>>>> 06/16/2019  IV Rocephin 06/12/2019>>>>> 06/16/2019   Subjective: Patient noted to have some bouts of agitation earlier on this morning.  Patient noted stuck in the corner of the bed and asking for help to sit up.  Denies chest pain or shortness of breath.   Objective: Vitals:   06/23/19 0254 06/23/19 0257 06/23/19 0259 06/23/19 0902  BP:  (!) 170/95 (!) 166/92 (!) 159/89  Pulse:  84 78 (!) 102  Resp:      Temp:  98.3 F (36.8 C)  98.5 F (36.9 C)  TempSrc:  Oral  Oral  SpO2:  100% 98% 100%  Weight: 90.6 kg     Height:        Intake/Output Summary (Last 24 hours) at 06/23/2019 1153 Last data filed at 06/23/2019 7353 Gross per 24 hour  Intake 0 ml  Output 1000 ml  Net -1000 ml   Filed Weights   06/19/19 0500 06/21/19 0429 06/23/19 0254  Weight: 92.4 kg 93.4 kg 90.6 kg    Examination:  General exam: NAD Respiratory system: CTAB.  No wheezes, no crackles, no rhonchi.  Normal respiratory effort.  Cardiovascular system: RRR no murmurs rubs or gallops.  No JVD.  No lower extremity edema.  Gastrointestinal system: Abdomen is soft, mildly distended, positive bowel sounds.  No rebound.  No guarding.   Central nervous system: Alert. No focal neurological deficits. Extremities: Symmetric 5 x 5 power. Skin: No rashes, lesions or ulcers Psychiatry: Judgement and insight appear poor. Mood & affect appropriate.     Data Reviewed: I have personally reviewed following labs and imaging studies  CBC: Recent Labs  Lab 06/18/19 0940 06/19/19 0505 06/20/19 0619 06/22/19 0704  WBC 6.9 6.9 7.7 5.9  NEUTROABS  --  4.7 5.6  --   HGB 9.3* 9.7* 10.2* 10.5*  HCT 28.1* 30.3* 30.7* 31.4*  MCV 85.4 88.1 86.0 84.9  PLT 175 217 254 299   Basic Metabolic Panel: Recent Labs  Lab 06/17/19 0450 06/18/19 0940 06/19/19 0505  06/20/19 0619 06/21/19 1805 06/22/19 0704 06/23/19 0603  NA 140 139 141 142  --  142 141  K 3.6 3.6 3.9 4.0  --  3.9 3.7  CL 105 104 109 109  --  109 108  CO2 _0 --  24 25  GLUCOSE 148* 106* 129* 100*  --  108* 111*  BUN 72* 65* 62* 50*  --  33* 31*  CREATININE 4.62* 4.25* 3.79* 3.34*  --  2.33* 2.14*  CALCIUM 8.1* 8.2* 8.5* 8.5*  --  9.2 9.8  MG 2.1 2.1 2.1 2.0 1.9 1.8 2.1  PHOS 5.3* 5.6* 5.6* 4.9*  --  5.0*  --    GFR: Estimated Creatinine Clearance: 47.3 mL/min (A) (by C-G formula based on SCr of 2.14 mg/dL (H)). Liver Function Tests: Recent Labs  Lab 06/17/19 0450 06/18/19 0940 06/19/19 0505 06/20/19 0619 06/22/19 0704  AST 67*  --   --   --   --   ALT 72*  --   --   --   --   ALKPHOS 75  --   --   --   --  BILITOT 0.7  --   --   --   --   PROT 6.1*  --   --   --   --   ALBUMIN 2.7*  2.8* 2.6* 2.7* 2.9* 3.1*   No results for input(s): LIPASE, AMYLASE in the last 168 hours. No results for input(s): AMMONIA in the last 168 hours. Coagulation Profile: No results for input(s): INR, PROTIME in the last 168 hours. Cardiac Enzymes: Recent Labs  Lab 06/21/19 1805  CKTOTAL 200   BNP (last 3 results) No results for input(s): PROBNP in the last 8760 hours. HbA1C: No results for input(s): HGBA1C in the last 72 hours. CBG: Recent Labs  Lab 06/22/19 0825  GLUCAP 88   Lipid Profile: No results for input(s): CHOL, HDL, LDLCALC, TRIG, CHOLHDL, LDLDIRECT in the last 72 hours. Thyroid Function Tests: No results for input(s): TSH, T4TOTAL, FREET4, T3FREE, THYROIDAB in the last 72 hours. Anemia Panel: No results for input(s): VITAMINB12, FOLATE, FERRITIN, TIBC, IRON, RETICCTPCT in the last 72 hours. Sepsis Labs: No results for input(s): PROCALCITON, LATICACIDVEN in the last 168 hours.  No results found for this or any previous visit (from the past 240 hour(s)).       Radiology Studies: No results found.      Scheduled Meds: . amLODipine  10 mg  Oral Daily  . cloNIDine  0.2 mg Transdermal Weekly  . feeding supplement (ENSURE ENLIVE)  237 mL Oral BID BM  . folic acid  1 mg Oral Daily  . heparin  5,000 Units Subcutaneous Q8H  . metoprolol tartrate  25 mg Oral BID  . multivitamin with minerals  1 tablet Oral Daily  . sodium chloride flush  3 mL Intravenous Q12H  . thiamine  100 mg Oral Daily  . vitamin C  250 mg Oral BID   Continuous Infusions:    LOS: 13 days    Time spent: 35 minutes    Irine Seal, MD [facility]  If 7PM-7AM, please contact night-coverage [email or web address] 06/23/2019, 11:53 AM

## 2019-06-24 LAB — RENAL FUNCTION PANEL
Albumin: 3.3 g/dL — ABNORMAL LOW (ref 3.5–5.0)
Anion gap: 10 (ref 5–15)
BUN: 33 mg/dL — ABNORMAL HIGH (ref 6–20)
CO2: 24 mmol/L (ref 22–32)
Calcium: 10.2 mg/dL (ref 8.9–10.3)
Chloride: 106 mmol/L (ref 98–111)
Creatinine, Ser: 2.15 mg/dL — ABNORMAL HIGH (ref 0.61–1.24)
GFR calc Af Amer: 38 mL/min — ABNORMAL LOW (ref >60)
GFR calc non Af Amer: 33 mL/min — ABNORMAL LOW (ref >60)
Glucose, Bld: 101 mg/dL — ABNORMAL HIGH (ref 70–99)
Phosphorus: 6 mg/dL — ABNORMAL HIGH (ref 2.5–4.6)
Potassium: 4 mmol/L (ref 3.5–5.1)
Sodium: 140 mmol/L (ref 135–145)

## 2019-06-24 LAB — GLUCOSE, CAPILLARY
Glucose-Capillary: 108 mg/dL — ABNORMAL HIGH (ref 70–99)
Glucose-Capillary: 157 mg/dL — ABNORMAL HIGH (ref 70–99)

## 2019-06-24 LAB — MAGNESIUM: Magnesium: 1.7 mg/dL (ref 1.7–2.4)

## 2019-06-24 MED ORDER — FUROSEMIDE 10 MG/ML IJ SOLN
40.0000 mg | Freq: Every day | INTRAMUSCULAR | Status: DC
  Administered 2019-06-24 – 2019-06-27 (×4): 40 mg via INTRAVENOUS
  Filled 2019-06-24 (×4): qty 4

## 2019-06-24 MED ORDER — HYDRALAZINE HCL 25 MG PO TABS
25.0000 mg | ORAL_TABLET | Freq: Three times a day (TID) | ORAL | Status: DC
  Administered 2019-06-24 – 2019-06-26 (×7): 25 mg via ORAL
  Filled 2019-06-24 (×7): qty 1

## 2019-06-24 MED ORDER — FUROSEMIDE 10 MG/ML IJ SOLN: 40.0000 mg | Freq: Every day | INTRAMUSCULAR | Status: DC

## 2019-06-24 MED ORDER — MAGNESIUM SULFATE 2 GM/50ML IV SOLN
2.0000 g | Freq: Once | INTRAVENOUS | Status: AC
  Administered 2019-06-24: 2 g via INTRAVENOUS
  Filled 2019-06-24: qty 50

## 2019-06-24 NOTE — Progress Notes (Signed)
[facility]  ROUNDING NOTE   Subjective:   No complaints. Patient pleasantly watching television  Creatinine 2.15 (2.14)  Condom catheter placed.   Objective:  Vital signs in last 24 hours:  Temp:  [98.1 F (36.7 C)-99.1 F (37.3 C)] 98.7 F (37.1 C) (11/15 0802) Pulse Rate:  [72-102] 72 (11/15 0802) Resp:  [15-19] 19 (11/15 0339) BP: (151-173)/(76-95) 173/76 (11/15 0802) SpO2:  [97 %-100 %] 97 % (11/15 0802) Weight:  [87.1 kg] 87.1 kg (11/15 0339)  Weight change: -3.538 kg Filed Weights   06/21/19 0429 06/23/19 0254 06/24/19 0339  Weight: 93.4 kg 90.6 kg 87.1 kg    Intake/Output: I/O last 3 completed shifts: In: -  Out: 550 [Urine:550]   Intake/Output this shift:  Total I/O In: 3 [I.V.:3] Out: -   Physical Exam: General:  laying in bed   Head: Normocephalic atraumatic  Eyes: Anicteric  Neck: Supple, trachea midline  Lungs:  Clear to auscultation   Heart: regular  Abdomen:  Soft, nontender, bowel sounds present  Extremities: No peripheral edema.  Neurologic: Not sedated. Able to follow commands  Skin: No lesions        Basic Metabolic Panel: Recent Labs  Lab 06/18/19 0940 06/19/19 0505 06/20/19 0619 06/21/19 1805 06/22/19 0704 06/23/19 0603 06/24/19 0541  NA 139 141 142  --  142 141 140  K 3.6 3.9 4.0  --  3.9 3.7 4.0  CL 104 109 109  --  109 108 106  CO2 26 25 24   --  24 25 24   GLUCOSE 106* 129* 100*  --  108* 111* 101*  BUN 65* 62* 50*  --  33* 31* 33*  CREATININE 4.25* 3.79* 3.34*  --  2.33* 2.14* 2.15*  CALCIUM 8.2* 8.5* 8.5*  --  9.2 9.8 10.2  MG 2.1 2.1 2.0 1.9 1.8 2.1 1.7  PHOS 5.6* 5.6* 4.9*  --  5.0*  --  6.0*    Liver Function Tests: Recent Labs  Lab 06/18/19 0940 06/19/19 0505 06/20/19 0619 06/22/19 0704 06/24/19 0541  ALBUMIN 2.6* 2.7* 2.9* 3.1* 3.3*   No results for input(s): LIPASE, AMYLASE in the last 168 hours. No results for input(s): AMMONIA in the last 168 hours.  CBC: Recent Labs  Lab  06/18/19 0940 06/19/19 0505 06/20/19 0619 06/22/19 0704  WBC 6.9 6.9 7.7 5.9  NEUTROABS  --  4.7 5.6  --   HGB 9.3* 9.7* 10.2* 10.5*  HCT 28.1* 30.3* 30.7* 31.4*  MCV 85.4 88.1 86.0 84.9  PLT 175 217 254 288    Cardiac Enzymes: Recent Labs  Lab 06/21/19 1805  CKTOTAL 200    BNP: Invalid input(s): POCBNP  CBG: Recent Labs  Lab 06/22/19 0825 06/24/19 0808  GLUCAP 54 108*    Microbiology: Results for orders placed or performed during the hospital encounter of 06/10/19  SARS Coronavirus 2 by RT PCR (hospital order, performed in [place] hospital lab)     Status: None   Collection Time: 06/10/19 10:02 PM  Result Value Ref Range Status   SARS Coronavirus 2 NEGATIVE NEGATIVE Final    Comment: (NOTE) If result is NEGATIVE SARS-CoV-2 target nucleic acids are NOT DETECTED. The SARS-CoV-2 RNA is generally detectable in upper and lower  respiratory specimens during the acute phase of infection. The lowest  concentration of SARS-CoV-2 viral copies this assay can detect is 250  copies / mL. A negative result does not preclude SARS-CoV-2 infection  and should not be used as the sole  basis for treatment or other  patient management decisions.  A negative result may occur with  improper specimen collection / handling, submission of specimen other  than nasopharyngeal swab, presence of viral mutation(s) within the  areas targeted by this assay, and inadequate number of viral copies  (<250 copies / mL). A negative result must be combined with clinical  observations, patient history, and epidemiological information. If result is POSITIVE SARS-CoV-2 target nucleic acids are DETECTED. The SARS-CoV-2 RNA is generally detectable in upper and lower  respiratory specimens dur ing the acute phase of infection.  Positive  results are indicative of active infection with SARS-CoV-2.  Clinical  correlation with patient history and other diagnostic information is  necessary to determine  patient infection status.  Positive results do  not rule out bacterial infection or co-infection with other viruses. If result is PRESUMPTIVE POSTIVE SARS-CoV-2 nucleic acids MAY BE PRESENT.   A presumptive positive result was obtained on the submitted specimen  and confirmed on repeat testing.  While 2019 novel coronavirus  (SARS-CoV-2) nucleic acids may be present in the submitted sample  additional confirmatory testing may be necessary for epidemiological  and / or clinical management purposes  to differentiate between  SARS-CoV-2 and other Sarbecovirus currently known to infect humans.  If clinically indicated additional testing with an alternate test  methodology [phone]) is advised. The SARS-CoV-2 RNA is generally  detectable in upper and lower respiratory sp ecimens during the acute  phase of infection. The expected result is Negative. Fact Sheet for Patients:  StrictlyIdeas.no Fact Sheet for Healthcare Providers: BankingDealers.co.za This test is not yet approved or cleared by the [place] FDA and has been authorized for detection and/or diagnosis of SARS-CoV-2 by FDA under an Emergency Use Authorization (EUA).  This EUA will remain in effect (meaning this test can be used) for the duration of the COVID-19 declaration under Section 564(b)(1) of the Act, 21 U.S.C. section 360bbb-3(b)(1), unless the authorization is terminated or revoked sooner. Performed at [facility], [place] [address], [place], [place] [address]   MRSA PCR Screening     Status: None   Collection Time: 06/11/19 12:30 PM   Specimen: Nasopharyngeal  Result Value Ref Range Status   MRSA by PCR NEGATIVE NEGATIVE Final    Comment:        The GeneXpert MRSA Assay (FDA approved for NASAL specimens only), is one component of a comprehensive MRSA colonization surveillance program. It is not intended to diagnose MRSA infection nor to guide or monitor  treatment for MRSA infections. Performed at [facility], [address] [place], [place] [address]   CULTURE, BLOOD (ROUTINE X 2) w Reflex to ID Panel     Status: None   Collection Time: 06/12/19  1:34 PM   Specimen: BLOOD  Result Value Ref Range Status   Specimen Description BLOOD BLOOD RIGHT HAND  Final   Special Requests   Final    BOTTLES DRAWN AEROBIC AND ANAEROBIC Blood Culture adequate volume   Culture   Final    NO GROWTH 5 DAYS Performed at [facility], [address] [place], [place] [address]    Report Status 06/17/2019 FINAL  Final  CULTURE, BLOOD (ROUTINE X 2) w Reflex to ID Panel     Status: None   Collection Time: 06/12/19  1:35 PM   Specimen: BLOOD  Result Value Ref Range Status   Specimen Description BLOOD BLOOD LEFT FOREARM  Final   Special Requests  Final    BOTTLES DRAWN AEROBIC AND ANAEROBIC Blood Culture adequate volume   Culture   Final    NO GROWTH 5 DAYS Performed at [facility], [address] [place], [place] [address]    Report Status 06/17/2019 FINAL  Final  Culture, respiratory (non-expectorated)     Status: None   Collection Time: 06/13/19 12:10 AM   Specimen: Tracheal Aspirate; Respiratory  Result Value Ref Range Status   Specimen Description   Final    TRACHEAL ASPIRATE Performed at [facility], [address], [place], [place] [address]    Special Requests   Final    NONE Performed at [facility], [place], [place] [address]    Gram Stain   Final    RARE WBC PRESENT, PREDOMINANTLY PMN MODERATE GRAM POSITIVE COCCI IN PAIRS IN CLUSTERS FEW GRAM NEGATIVE RODS    Culture   Final    FEW Consistent with normal respiratory flora. Performed at [facility], [place] [address], [place], [place] [address]    Report Status 06/15/2019 FINAL  Final    Coagulation Studies: No results for input(s): LABPROT, INR in the last 72 hours.  Urinalysis: No results for  input(s): COLORURINE, LABSPEC, PHURINE, GLUCOSEU, HGBUR, BILIRUBINUR, KETONESUR, PROTEINUR, UROBILINOGEN, NITRITE, LEUKOCYTESUR in the last 72 hours.  Invalid input(s): APPERANCEUR    Imaging: No results found.   Medications:   . magnesium sulfate bolus IVPB 2 g (06/24/19 0921)   . amLODipine  10 mg Oral Daily  . cloNIDine  0.2 mg Transdermal Weekly  . feeding supplement (ENSURE ENLIVE)  237 mL Oral BID BM  . folic acid  1 mg Oral Daily  . furosemide  40 mg Intravenous Daily  . heparin  5,000 Units Subcutaneous Q8H  . hydrALAZINE  25 mg Oral Q8H  . metoprolol tartrate  25 mg Oral BID  . multivitamin with minerals  1 tablet Oral Daily  . sodium chloride flush  3 mL Intravenous Q12H  . thiamine  100 mg Oral Daily  . vitamin C  250 mg Oral BID   ALPRAZolam, hydrALAZINE, ipratropium-albuterol, labetalol, ondansetron **OR** ondansetron (ZOFRAN) IV  Assessment/ Plan:  58 y.o.black male with alcohol abuse and educational disability who was admitted to [facility] on 06/10/2019 for evaluation of weakness, diarrhea, nausea, and vomiting.    1.  Acute kidney injury with metabolic acidosis suspected secondary to prolonged volume depletion (prerenal azotemia) and rhabdomyolysis.   Baseline creatinine of 0.78, normal GFR on 01/2018 Patient required continuous renal placement therapy  - nonoliguric urine output.  - Off IV fluids - Encourage PO intake  2.  Anemia with renal failure:   No indication for ESA. Hemoglobin 10.5  3. Hypertension: Elevated. No home medications - started on metoprolol, amlodipine and clonidine patch this admission.  - IV furosemide.    LOS: 14 Ericberto Padget 11/15/20209:27 AM

## 2019-06-24 NOTE — Plan of Care (Signed)
  Problem: Education: Goal: Knowledge of General Education information will improve Description: Including pain rating scale, medication(s)/side effects and non-pharmacologic comfort measures Outcome: Progressing   Problem: Safety: Goal: Ability to remain free from injury will improve Outcome: Progressing   

## 2019-06-24 NOTE — TOC Progression Note (Signed)
Transition of Care [facility]) - Progression Note    Patient Details  Name: Martin Diaz MRN: [id number] Date of Birth: October 06, 1960  Transition of Care [facility]) CM/SW Contact  Latanya Maudlin, RN Phone Number: 06/24/2019, 8:36 AM  Clinical Narrative:   Spoke with Aunt who is POA. As is stands [facility] is only current bed offer. They would like time to see if more offers come in as they "are not thrilled with [facility]". I informed her out [facility] team could reach out tomorrow if there were updates on bed offers.     Expected Discharge Plan: [place] Barriers to Discharge: Continued Medical Work up  Expected Discharge Plan and Services Expected Discharge Plan: [place] In-house Referral: Clinical Social Work   Post Acute Care Choice: [place] Living arrangements for the past 2 months: [place]                 DME Arranged: N/A         HH Arranged: NA           Social Determinants of Health (SDOH) Interventions    Readmission Risk Interventions No flowsheet data found.

## 2019-06-24 NOTE — Progress Notes (Signed)
PROGRESS NOTE    Martin Diaz  XBL:[id number] DOB: 1961-01-09 DOA: 06/10/2019 PCP: Patient, No Pcp Per   Brief Narrative:   Martin Diaz is a 58 y.o. M with a history of ETOH abuse and intellectual disability (lives in a boarding house, Aunt/Uncle guardian) admitted 11/1 with reports of several days of N/V/D, shaking and weakness.    In the ER, Cr 11.56, BUN 188.  HCO3 17, AST 450 / ALT 237, alk phos 77, lipase 339. RUQ [place] negative for gallstones or hepatobiliary abnormality.  Started IV fluids for rhabdo, AKI.  Renal US negative for obstruction. Developed worsening mental status and intubated for airway protection   Assessment & Plan:   Principal Problem:   Acute renal failure (ARF) (HCC) Active Problems:   Pancreatitis, alcoholic, acute   Alcohol use   Hypocalcemia   Non-traumatic rhabdomyolysis   Metabolic acidosis  1 acute renal failure/rhabdomyolysis/metabolic acidosis/left renal cyst (baseline creatinine 0.8) Acute renal failure likely secondary to a prerenal azotemia secondary to volume depletion and rhabdomyolysis.  Patient noted to have a CK level of 24,000 on admission.  Renal ultrasound which was done was negative for hydronephrosis.  Nephrology was consulted who are following the patient.  Patient was started on CRRT and subsequently transition to IHD.  Last HD was 06/14/2019.  Hemodialysis catheter to be removed per nephrology. Patient with a urine output recorded not properly recorded over the past 24 hours.  Creatinine seems to be plateauing and currently at 2.15 from 2.14 from 2.33 from 3.34 (06/20/2019) from 11.56 on admission.  Nephrology following and appreciate their input and recommendations.  2.  Metabolic acidosis Likely secondary to problem #1.  Resolved with bicarb drip.   3.  Agitated delirium/baseline cognitive delay Felt likely secondary to alcohol withdrawal or ICU delirium superimposed on the baseline intellectual disability.  Patient noted per prior  physician to be very impulsive pulling out lines and required sedation with Precedex to prevent inadvertently pulling out his dialysis catheter.  Precedex has been discontinued.  Patient currently on clonidine.  Follow.  4.  Acute respiratory insufficiency in the setting of AMS/possible aspiration Patient was intubated for airway protection in the setting of rhabdomyolysis and acute renal failure.  Patient extubated on 06/14/2019.  Patient was treated empirically for possible aspiration pneumonia completed course of antibiotic treatment.  Patient currently afebrile.  Patient is not hypoxic, with sats of 96% on room air.  No further antibiotics needed at this time.  5.  Hypertension Still with significantly elevated blood pressure.  Continue clonidine patch.  Continue Lopressor 25 mg twice daily.  Continue Norvasc 10 mg daily.  Will start hydralazine 25 mg p.o. 3 times daily for better blood pressure control.  IV labetalol and hydralazine as needed.    6.  Hypocalcemia Resolved.  7.  Pancreatitis Lipase noted to be 339 on admission.  Patient history of alcohol abuse.  Patient required Precedex drips however currently off Precedex drip.  Tolerating oral intake.  Resolved.  8.  History of alcohol abuse Prior MD felt patient was likely being given alcohol to him by his family members or friends when he was out of his group home.  Patient unable to quantify amount of alcohol intake likely due to baseline cognitive impairment.  Continue thiamine, folic acid, multivitamin.  Follow.   DVT prophylaxis: Heparin Code Status: Full Family Communication: No family at bedside. Disposition Plan: SNF when clinically improved.  Resolution of acute renal failure and when cleared by nephrology.   Consultants:  Nephrology: Dr. Holley Raring 06/11/2019  PCCM: Dr Lanney Gins 06/12/2019  Procedures:  Renal US 11/1 >> medical renal disease of both kidneys, exophytic indeterminate nodule lateral aspect of the upper to mid  left kidney 2.2 cm (query solid or complex cystic lesion, MR imaging recommended) ETOH 11/1 >> negative  [place] ABD Limited 11/1 >> no evidence of gallstones or hepatobiliary abnormality   Antimicrobials:   Azithromycin 06/12/2019>>>> 06/16/2019  IV Rocephin 06/12/2019>>>>> 06/16/2019   Subjective: Patient sleeping deeply.  Arousable.  Drifts back off to sleep.  Objective: Vitals:   06/23/19 1617 06/23/19 2039 06/24/19 0339 06/24/19 0802  BP: (!) 158/84 (!) 172/95 (!) 151/84 (!) 173/76  Pulse: (!) 102 (!) 101 91 72  Resp: 15 19 19    Temp: 98.1 F (36.7 C) 98.4 F (36.9 C) 99.1 F (37.3 C) 98.7 F (37.1 C)  TempSrc: Oral Oral Oral Oral  SpO2: 99% 98% 100% 97%  Weight:   87.1 kg   Height:        Intake/Output Summary (Last 24 hours) at 06/24/2019 1213 Last data filed at 06/24/2019 1024 Gross per 24 hour  Intake 363 ml  Output 350 ml  Net 13 ml   Filed Weights   06/21/19 0429 06/23/19 0254 06/24/19 0339  Weight: 93.4 kg 90.6 kg 87.1 kg    Examination:  General exam: NAD Respiratory system: Clear to auscultation bilaterally anterior lung fields.  No wheezes, no crackles, no rhonchi.  Normal respiratory effort.  Cardiovascular system: Regular rate rhythm no murmurs rubs or gallops.  No JVD.  No lower extremity edema.  Gastrointestinal system: Abdomen is mildly distended, soft, positive bowel sounds.  No rebound.  No guarding.   Central nervous system: Alert. No focal neurological deficits. Extremities: Symmetric 5 x 5 power. Skin: No rashes, lesions or ulcers Psychiatry: Judgement and insight appear poor. Mood & affect appropriate.     Data Reviewed: I have personally reviewed following labs and imaging studies  CBC: Recent Labs  Lab 06/18/19 0940 06/19/19 0505 06/20/19 0619 06/22/19 0704  WBC 6.9 6.9 7.7 5.9  NEUTROABS  --  4.7 5.6  --   HGB 9.3* 9.7* 10.2* 10.5*  HCT 28.1* 30.3* 30.7* 31.4*  MCV 85.4 88.1 86.0 84.9  PLT 175 217 254 748   Basic Metabolic  Panel: Recent Labs  Lab 06/18/19 0940 06/19/19 0505 06/20/19 0619 06/21/19 1805 06/22/19 0704 06/23/19 0603 06/24/19 0541  NA 139 141 142  --  142 141 140  K 3.6 3.9 4.0  --  3.9 3.7 4.0  CL 104 109 109  --  109 108 106  CO2 26 25 24   --  24 25 24   GLUCOSE 106* 129* 100*  --  108* 111* 101*  BUN 65* 62* 50*  --  33* 31* 33*  CREATININE 4.25* 3.79* 3.34*  --  2.33* 2.14* 2.15*  CALCIUM 8.2* 8.5* 8.5*  --  9.2 9.8 10.2  MG 2.1 2.1 2.0 1.9 1.8 2.1 1.7  PHOS 5.6* 5.6* 4.9*  --  5.0*  --  6.0*   GFR: Estimated Creatinine Clearance: 47.1 mL/min (A) (by C-G formula based on SCr of 2.15 mg/dL (H)). Liver Function Tests: Recent Labs  Lab 06/18/19 0940 06/19/19 0505 06/20/19 0619 06/22/19 0704 06/24/19 0541  ALBUMIN 2.6* 2.7* 2.9* 3.1* 3.3*   No results for input(s): LIPASE, AMYLASE in the last 168 hours. No results for input(s): AMMONIA in the last 168 hours. Coagulation Profile: No results for input(s): INR, PROTIME in the last 168  hours. Cardiac Enzymes: Recent Labs  Lab 06/21/19 1805  CKTOTAL 200   BNP (last 3 results) No results for input(s): PROBNP in the last 8760 hours. HbA1C: No results for input(s): HGBA1C in the last 72 hours. CBG: Recent Labs  Lab 06/22/19 0825 06/24/19 0808  GLUCAP 88 108*   Lipid Profile: No results for input(s): CHOL, HDL, LDLCALC, TRIG, CHOLHDL, LDLDIRECT in the last 72 hours. Thyroid Function Tests: No results for input(s): TSH, T4TOTAL, FREET4, T3FREE, THYROIDAB in the last 72 hours. Anemia Panel: No results for input(s): VITAMINB12, FOLATE, FERRITIN, TIBC, IRON, RETICCTPCT in the last 72 hours. Sepsis Labs: No results for input(s): PROCALCITON, LATICACIDVEN in the last 168 hours.  No results found for this or any previous visit (from the past 240 hour(s)).       Radiology Studies: No results found.      Scheduled Meds: . amLODipine  10 mg Oral Daily  . cloNIDine  0.2 mg Transdermal Weekly  . feeding supplement  (ENSURE ENLIVE)  237 mL Oral BID BM  . folic acid  1 mg Oral Daily  . furosemide  40 mg Intravenous Daily  . heparin  5,000 Units Subcutaneous Q8H  . hydrALAZINE  25 mg Oral Q8H  . metoprolol tartrate  25 mg Oral BID  . multivitamin with minerals  1 tablet Oral Daily  . sodium chloride flush  3 mL Intravenous Q12H  . thiamine  100 mg Oral Daily  . vitamin C  250 mg Oral BID   Continuous Infusions:    LOS: 14 days    Time spent: 35 minutes    Irine Seal, MD [facility]  If 7PM-7AM, please contact night-coverage [email or web address] 06/24/2019, 12:13 PM

## 2019-06-25 LAB — CBC
HCT: 33 % — ABNORMAL LOW (ref 39.0–52.0)
Hemoglobin: 10.6 g/dL — ABNORMAL LOW (ref 13.0–17.0)
MCH: 28.5 pg (ref 26.0–34.0)
MCHC: 32.1 g/dL (ref 30.0–36.0)
MCV: 88.7 fL (ref 80.0–100.0)
Platelets: 279 10*3/uL (ref 150–400)
RBC: 3.72 MIL/uL — ABNORMAL LOW (ref 4.22–5.81)
RDW: 12.6 % (ref 11.5–15.5)
WBC: 7.8 10*3/uL (ref 4.0–10.5)
nRBC: 0 % (ref 0.0–0.2)

## 2019-06-25 LAB — RENAL FUNCTION PANEL
Albumin: 3.2 g/dL — ABNORMAL LOW (ref 3.5–5.0)
Anion gap: 12 (ref 5–15)
BUN: 31 mg/dL — ABNORMAL HIGH (ref 6–20)
CO2: 25 mmol/L (ref 22–32)
Calcium: 10.3 mg/dL (ref 8.9–10.3)
Chloride: 106 mmol/L (ref 98–111)
Creatinine, Ser: 1.92 mg/dL — ABNORMAL HIGH (ref 0.61–1.24)
GFR calc Af Amer: 44 mL/min — ABNORMAL LOW (ref >60)
GFR calc non Af Amer: 38 mL/min — ABNORMAL LOW (ref >60)
Glucose, Bld: 110 mg/dL — ABNORMAL HIGH (ref 70–99)
Phosphorus: 7.3 mg/dL — ABNORMAL HIGH (ref 2.5–4.6)
Potassium: 3.8 mmol/L (ref 3.5–5.1)
Sodium: 143 mmol/L (ref 135–145)

## 2019-06-25 LAB — MAGNESIUM: Magnesium: 1.9 mg/dL (ref 1.7–2.4)

## 2019-06-25 NOTE — Progress Notes (Signed)
[facility]  ROUNDING NOTE   Subjective:   Length of stay: 15 days 11/15 0701 - 11/16 0700 In: [id number] [P.O.:480; I.V.:3] Out: 1700 [Urine:1700] Denies any acute complaints Serum creatinine slightly improved   Objective:  Vital signs in last 24 hours:  Temp:  [98.4 F (36.9 C)-99.3 F (37.4 C)] 98.4 F (36.9 C) (11/16 0825) Pulse Rate:  [73-81] 73 (11/16 0825) Resp:  [18-20] 19 (11/16 0825) BP: (134-161)/(77-89) 159/89 (11/16 0825) SpO2:  [96 %-98 %] 97 % (11/16 0825) Weight:  [81.6 kg] 81.6 kg (11/16 0414)  Weight change: -5.443 kg Filed Weights   06/23/19 0254 06/24/19 0339 06/25/19 0414  Weight: 90.6 kg 87.1 kg 81.6 kg    Intake/Output: I/O last 3 completed shifts: In: 71 [P.O.:480; I.V.:3] Out: 2050 [Urine:2050]   Intake/Output this shift:  No intake/output data recorded.  Physical Exam: General:  laying in bed   Head: Normocephalic atraumatic  Eyes: Anicteric  Neck: Supple,   Lungs:  Clear to auscultation   Heart: regular  Abdomen:  Soft, nontender, bowel sounds present  Extremities: No peripheral edema.  Neurologic:  Lethargic, answers few questions, mumbles  Skin: No lesions        Basic Metabolic Panel: Recent Labs  Lab 06/19/19 0505 06/20/19 0619 06/21/19 1805 06/22/19 0704 06/23/19 0603 06/24/19 0541 06/25/19 0448  NA 141 142  --  142 141 140 143  K 3.9 4.0  --  3.9 3.7 4.0 3.8  CL 109 109  --  109 108 106 106  CO2 25 24  --  24 25 24 25   GLUCOSE 129* 100*  --  108* 111* 101* 110*  BUN 62* 50*  --  33* 31* 33* 31*  CREATININE 3.79* 3.34*  --  2.33* 2.14* 2.15* 1.92*  CALCIUM 8.5* 8.5*  --  9.2 9.8 10.2 10.3  MG 2.1 2.0 1.9 1.8 2.1 1.7 1.9  PHOS 5.6* 4.9*  --  5.0*  --  6.0* 7.3*    Liver Function Tests: Recent Labs  Lab 06/19/19 0505 06/20/19 0619 06/22/19 0704 06/24/19 0541 06/25/19 0448  ALBUMIN 2.7* 2.9* 3.1* 3.3* 3.2*   No results for input(s): LIPASE, AMYLASE in the last 168 hours. No results for input(s):  AMMONIA in the last 168 hours.  CBC: Recent Labs  Lab 06/19/19 0505 06/20/19 0619 06/22/19 0704 06/25/19 0448  WBC 6.9 7.7 5.9 7.8  NEUTROABS 4.7 5.6  --   --   HGB 9.7* 10.2* 10.5* 10.6*  HCT 30.3* 30.7* 31.4* 33.0*  MCV 88.1 86.0 84.9 88.7  PLT 217 254 288 279    Cardiac Enzymes: Recent Labs  Lab 06/21/19 1805  CKTOTAL 200    BNP: Invalid input(s): POCBNP  CBG: Recent Labs  Lab 06/22/19 0825 06/24/19 0808 06/24/19 2221  GLUCAP 11 108* 157*    Microbiology: Results for orders placed or performed during the hospital encounter of 06/10/19  SARS Coronavirus 2 by RT PCR (hospital order, performed in [place] hospital lab)     Status: None   Collection Time: 06/10/19 10:02 PM  Result Value Ref Range Status   SARS Coronavirus 2 NEGATIVE NEGATIVE Final    Comment: (NOTE) If result is NEGATIVE SARS-CoV-2 target nucleic acids are NOT DETECTED. The SARS-CoV-2 RNA is generally detectable in upper and lower  respiratory specimens during the acute phase of infection. The lowest  concentration of SARS-CoV-2 viral copies this assay can detect is 250  copies / mL. A negative result does not preclude SARS-CoV-2  infection  and should not be used as the sole basis for treatment or other  patient management decisions.  A negative result may occur with  improper specimen collection / handling, submission of specimen other  than nasopharyngeal swab, presence of viral mutation(s) within the  areas targeted by this assay, and inadequate number of viral copies  (<250 copies / mL). A negative result must be combined with clinical  observations, patient history, and epidemiological information. If result is POSITIVE SARS-CoV-2 target nucleic acids are DETECTED. The SARS-CoV-2 RNA is generally detectable in upper and lower  respiratory specimens dur ing the acute phase of infection.  Positive  results are indicative of active infection with SARS-CoV-2.  Clinical  correlation  with patient history and other diagnostic information is  necessary to determine patient infection status.  Positive results do  not rule out bacterial infection or co-infection with other viruses. If result is PRESUMPTIVE POSTIVE SARS-CoV-2 nucleic acids MAY BE PRESENT.   A presumptive positive result was obtained on the submitted specimen  and confirmed on repeat testing.  While 2019 novel coronavirus  (SARS-CoV-2) nucleic acids may be present in the submitted sample  additional confirmatory testing may be necessary for epidemiological  and / or clinical management purposes  to differentiate between  SARS-CoV-2 and other Sarbecovirus currently known to infect humans.  If clinically indicated additional testing with an alternate test  methodology [phone]) is advised. The SARS-CoV-2 RNA is generally  detectable in upper and lower respiratory sp ecimens during the acute  phase of infection. The expected result is Negative. Fact Sheet for Patients:  StrictlyIdeas.no Fact Sheet for Healthcare Providers: BankingDealers.co.za This test is not yet approved or cleared by the [place] FDA and has been authorized for detection and/or diagnosis of SARS-CoV-2 by FDA under an Emergency Use Authorization (EUA).  This EUA will remain in effect (meaning this test can be used) for the duration of the COVID-19 declaration under Section 564(b)(1) of the Act, 21 U.S.C. section 360bbb-3(b)(1), unless the authorization is terminated or revoked sooner. Performed at [facility], [place] [address] [place], [place] [address]   MRSA PCR Screening     Status: None   Collection Time: 06/11/19 12:30 PM   Specimen: Nasopharyngeal  Result Value Ref Range Status   MRSA by PCR NEGATIVE NEGATIVE Final    Comment:        The GeneXpert MRSA Assay (FDA approved for NASAL specimens only), is one component of a comprehensive MRSA colonization surveillance  program. It is not intended to diagnose MRSA infection nor to guide or monitor treatment for MRSA infections. Performed at [facility], [address] [place], [place] [address]   CULTURE, BLOOD (ROUTINE X 2) w Reflex to ID Panel     Status: None   Collection Time: 06/12/19  1:34 PM   Specimen: BLOOD  Result Value Ref Range Status   Specimen Description BLOOD BLOOD RIGHT HAND  Final   Special Requests   Final    BOTTLES DRAWN AEROBIC AND ANAEROBIC Blood Culture adequate volume   Culture   Final    NO GROWTH 5 DAYS Performed at [facility], [address] [place], [place] [address]    Report Status 06/17/2019 FINAL  Final  CULTURE, BLOOD (ROUTINE X 2) w Reflex to ID Panel     Status: None   Collection Time: 06/12/19  1:35 PM   Specimen: BLOOD  Result Value Ref Range Status   Specimen Description BLOOD  BLOOD LEFT FOREARM  Final   Special Requests   Final    BOTTLES DRAWN AEROBIC AND ANAEROBIC Blood Culture adequate volume   Culture   Final    NO GROWTH 5 DAYS Performed at [facility], [address] [place], [place] [address]    Report Status 06/17/2019 FINAL  Final  Culture, respiratory (non-expectorated)     Status: None   Collection Time: 06/13/19 12:10 AM   Specimen: Tracheal Aspirate; Respiratory  Result Value Ref Range Status   Specimen Description   Final    TRACHEAL ASPIRATE Performed at [facility], [address] [place], [place] [address]    Special Requests   Final    NONE Performed at [facility], [place], [place] [address]    Gram Stain   Final    RARE WBC PRESENT, PREDOMINANTLY PMN MODERATE GRAM POSITIVE COCCI IN PAIRS IN CLUSTERS FEW GRAM NEGATIVE RODS    Culture   Final    FEW Consistent with normal respiratory flora. Performed at [facility], [place] [address], [place], [place] [address]    Report Status 06/15/2019 FINAL  Final    Coagulation Studies: No results for  input(s): LABPROT, INR in the last 72 hours.  Urinalysis: No results for input(s): COLORURINE, LABSPEC, PHURINE, GLUCOSEU, HGBUR, BILIRUBINUR, KETONESUR, PROTEINUR, UROBILINOGEN, NITRITE, LEUKOCYTESUR in the last 72 hours.  Invalid input(s): APPERANCEUR    Imaging: No results found.   Medications:    . amLODipine  10 mg Oral Daily  . cloNIDine  0.2 mg Transdermal Weekly  . feeding supplement (ENSURE ENLIVE)  237 mL Oral BID BM  . folic acid  1 mg Oral Daily  . furosemide  40 mg Intravenous Daily  . heparin  5,000 Units Subcutaneous Q8H  . hydrALAZINE  25 mg Oral Q8H  . metoprolol tartrate  25 mg Oral BID  . multivitamin with minerals  1 tablet Oral Daily  . sodium chloride flush  3 mL Intravenous Q12H  . thiamine  100 mg Oral Daily  . vitamin C  250 mg Oral BID   ALPRAZolam, hydrALAZINE, ipratropium-albuterol, labetalol, ondansetron **OR** ondansetron (ZOFRAN) IV  Assessment/ Plan:  58 y.o.black male with alcohol abuse and educational disability who was admitted to [facility] on 06/10/2019 for evaluation of weakness, diarrhea, nausea, and vomiting.    1.  Acute kidney injury with metabolic acidosis suspected secondary to prolonged volume depletion (prerenal azotemia) and rhabdomyolysis.   Baseline creatinine of 0.78, normal GFR on 01/2018 Patient required continuous renal placement therapy  - nonoliguric urine output.  - Encourage PO intake -Serum creatinine is improving slowly, will monitor closely  2.  Anemia with renal failure:   No indication for ESA.  Lab Results  Component Value Date   HGB 10.6 (L) 06/25/2019       LOS: 15 Dayzee Trower 11/16/202010:58 AM

## 2019-06-25 NOTE — Care Management Important Message (Signed)
Important Message  Patient Details  Name: Martin Diaz MRN: [id number] Date of Birth: March 18, 1961   Medicare Important Message Given:  Yes     Dannette Barbara 06/25/2019, 12:21 PM

## 2019-06-25 NOTE — TOC Progression Note (Addendum)
Transition of Care [facility]) - Progression Note    Patient Details  Name: Martin Diaz MRN: [id number] Date of Birth: August 20, 1960  Transition of Care [facility]) CM/SW Contact  Ross Ludwig, [place] Phone Number: 06/25/2019, 5:16 PM  Clinical Narrative:     CSW attempted to contact patient's Aunt regarding SNF placement options.  Received recorder, that patient's voice mail is full, CSW to try again at a later time.  Per weekend case manager, patient's aunt did not like [facility] as a choice, she wanted to wait for other offers.  Patient's information was faxed to [facility] too.  5:30pm  CSW received call back from patient's aunt, she asked if CSW can check near [place], [place] to see if they have any beds available.  CSW attempted to contact admissions worker at [facility], [facility] left a message awaiting for call back with admissions worker.  Expected Discharge Plan: [place] Barriers to Discharge: Continued Medical Work up  Expected Discharge Plan and Services Expected Discharge Plan: [place] In-house Referral: Clinical Social Work   Post Acute Care Choice: [place] Living arrangements for the past 2 months: [place]                 DME Arranged: N/A         HH Arranged: NA           Social Determinants of Health (SDOH) Interventions    Readmission Risk Interventions No flowsheet data found.

## 2019-06-25 NOTE — Progress Notes (Signed)
Physical Therapy Treatment Patient Details Name: Martin Diaz MRN: [id number] DOB: June 17, 1961 Today's Date: 06/25/2019    History of Present Illness Pt is a 58 y.o. male presenting to hospital 06/10/19 with abdominal pain, generalized decline, generalized weakness, shakiness, vomiting, and diarrhea.  Pt admitted with acute renal failure, pancreatitis, hypocalcemia, and rhabdomyolysis.  Intubated 11/3 and extubated 11/5.  R IJ temporary HD catheter placed 11/3 and removed 11/10.  PMH includes alcohol use, cognitive impairment (pt chart pt's uncle reports pt functions at 76 y.o. level).    PT Comments    Pt requiring 2 assist with bed mobility d/t pt's difficulty initiating movement.  Initially max assist for sitting balance d/t posterior lean but with cueing and repositioning pt eventually able to sit edge of bed with B UE support and close SBA.  2 assist to stand pt up to RW (pt in mildly flexed posture requiring cueing to improve posture).  Pt unable to take any steps with L LE but able to move R LE an inch forward with cueing and B UE support on RW.  Will continue to focus on strengthening and progressive functional mobility per pt tolerance.    Follow Up Recommendations  SNF     Equipment Recommendations  Rolling walker with 5" wheels;3in1 (PT);Wheelchair (measurements PT);Wheelchair cushion (measurements PT)    Recommendations for Other Services OT consult     Precautions / Restrictions Precautions Precautions: Fall Precaution Comments: aspiration Restrictions Weight Bearing Restrictions: No    Mobility  Bed Mobility Overal bed mobility: Needs Assistance Bed Mobility: Supine to Sit     Supine to sit: Max assist;+2 for physical assistance;HOB elevated Sit to supine: Max assist;+2 for physical assistance;HOB elevated   General bed mobility comments: assist for trunk and B LE's d/t minimal initiation of movement  Transfers Overall transfer level: Needs  assistance Equipment used: Rolling walker (2 wheeled);None Transfers: Sit to/from Stand Sit to Stand: Mod assist;+2 physical assistance         General transfer comment: vc's and tactile cues for UE/LE placement; assist to initiate and come to stand (pt still with mild flexed posture even with vc's and tactile cues)  Ambulation/Gait Ambulation/Gait assistance: +2 physical assistance   Assistive device: Rolling walker (2 wheeled)   Gait velocity: decreased   General Gait Details: pt unable to take any steps with L LE with max cueing and 2 assist; able to move R LE an inch forward x2 trials with 2 assist   Stairs             Wheelchair Mobility    Modified Rankin (Stroke Patients Only)       Balance Overall balance assessment: Needs assistance Sitting-balance support: Bilateral upper extremity supported;Feet supported Sitting balance-Leahy Scale: Poor Sitting balance - Comments: pt initially requiring max assist for sitting balance d/t significant posterior lean but with cueing and assist (and repositioning B UE's to assist) pt able to sit with close SBA for safety   Standing balance support: Bilateral upper extremity supported Standing balance-Leahy Scale: Poor Standing balance comment: pt pushing B LE's against bed to stabilize in standing with B UE support on RW                            Cognition Arousal/Alertness: Awake/alert Behavior During Therapy: Flat affect Overall Cognitive Status: History of cognitive impairments - at baseline  General Comments: Increased time to respond and initiate movement.  Inconsistent with following 1 step commands.  Oriented to person (name and month of birthday only).      Exercises      General Comments   Nursing cleared pt for participation in physical therapy.  Pt agreeable to PT session.      Pertinent Vitals/Pain Pain Assessment: Faces Faces Pain Scale: No  hurt Pain Intervention(s): Limited activity within patient's tolerance;Monitored during session;Repositioned  Vitals (HR and O2 on room air) stable and WFL throughout treatment session.    Home Living                      Prior Function            PT Goals (current goals can now be found in the care plan section) Acute Rehab PT Goals Patient Stated Goal: to go home PT Goal Formulation: With patient Time For Goal Achievement: 07/04/19 Potential to Achieve Goals: Fair Progress towards PT goals: Progressing toward goals    Frequency    Min 2X/week      PT Plan Current plan remains appropriate    Co-evaluation              AM-PAC PT "6 Clicks" Mobility   Outcome Measure  Help needed turning from your back to your side while in a flat bed without using bedrails?: A Lot Help needed moving from lying on your back to sitting on the side of a flat bed without using bedrails?: A Lot Help needed moving to and from a bed to a chair (including a wheelchair)?: Total Help needed standing up from a chair using your arms (e.g., wheelchair or bedside chair)?: Total Help needed to walk in hospital room?: Total Help needed climbing 3-5 steps with a railing? : Total 6 Click Score: 8    End of Session Equipment Utilized During Treatment: Gait belt Activity Tolerance: Patient limited by fatigue Patient left: in bed;with call bell/phone within reach;with bed alarm set;with family/visitor present;Other (comment)(bed in lowest position) Nurse Communication: Mobility status;Precautions PT Visit Diagnosis: Other abnormalities of gait and mobility (R26.89);Difficulty in walking, not elsewhere classified (R26.2);Muscle weakness (generalized) (M62.81)     Time: [id number] PT Time Calculation (min) (ACUTE ONLY): 39 min  Charges:  $Therapeutic Activity: 38-52 mins                     Leitha Bleak, PT 06/25/19, 4:25 PM [phone]

## 2019-06-25 NOTE — Progress Notes (Addendum)
PROGRESS NOTE    Martin Diaz  VPX:[id number] DOB: 05-23-1961 DOA: 06/10/2019 PCP: Patient, No Pcp Per   Brief Narrative:  Martin Diaz is a 58 y.o. M with a history of ETOH abuse and intellectual disability (lives in a boarding house, Aunt/Uncle guardian) admitted 11/1 with reports of several days of N/V/D, shaking and weakness.    In the ER, Cr 11.56, BUN 188.  HCO3 17, AST 450 / ALT 237, alk phos 77, lipase 339. RUQ [place] negative for gallstones or hepatobiliary abnormality.  Started IV fluids for rhabdo, AKI.  Renal US negative for obstruction. Developed worsening mental status and intubated for airway protection   Assessment & Plan:   Principal Problem:   Acute renal failure (ARF) (HCC) Active Problems:   Pancreatitis, alcoholic, acute   Alcohol use   Hypocalcemia   Non-traumatic rhabdomyolysis   Metabolic acidosis  1 acute renal failure/rhabdomyolysis/metabolic acidosis/left renal cyst (baseline creatinine 0.8) Acute renal failure likely secondary to a prerenal azotemia secondary to volume depletion and rhabdomyolysis.  Patient noted to have a CK level of 24,000 on admission.  Renal ultrasound which was done was negative for hydronephrosis.  Nephrology was consulted who are following the patient.  Patient was started on CRRT and subsequently transition to IHD.  Last HD was 06/14/2019.  Hemodialysis catheter  removed per nephrology. Patient with a urine output of 1.7 L over the past 24 hours after patient started on Lasix 40 mg IV daily per nephrology.  Patient currently -6.383 L during this hospitalization.  Renal function started to trend back down and creatinine currently at 1.92 from 2.15 from 2.14 from 2.33 from 3.34 (06/20/2019) from 11.56 on admission.  Nephrology following and appreciate their input and recommendations.  2.  Metabolic acidosis Likely secondary to problem #1.  Resolved with bicarb drip.   3.  Agitated delirium/baseline cognitive delay Felt likely secondary  to alcohol withdrawal or ICU delirium superimposed on the baseline intellectual disability.  Patient noted per prior physician to be very impulsive pulling out lines and required sedation with Precedex to prevent inadvertently pulling out his dialysis catheter.  Precedex has been discontinued.  Patient currently on clonidine.  Follow.  4.  Acute respiratory insufficiency in the setting of AMS/possible aspiration Patient was intubated for airway protection in the setting of rhabdomyolysis and acute renal failure.  Patient extubated on 06/14/2019.  Patient was treated empirically for possible aspiration pneumonia completed course of antibiotic treatment.  Patient currently afebrile.  Patient is not hypoxic, with sats of 97% on room air.  No further antibiotics needed at this time.  5.  Hypertension Provement with blood pressure with addition of hydralazine.  Continue current regimen of clonidine patch, Lopressor 25 mg twice daily, Norvasc 10 mg daily, hydralazine 25 mg 3 times daily.  IV labetalol and hydralazine as needed.    6.  Hypocalcemia Resolved.  7.  Pancreatitis Lipase noted to be 339 on admission.  Patient history of alcohol abuse.  Patient required Precedex drips however currently off Precedex drip.  Tolerating oral intake.  Resolved.  8.  History of alcohol abuse Prior MD felt patient was likely being given alcohol to him by his family members or friends when he was out of his group home.  Patient unable to quantify amount of alcohol intake likely due to baseline cognitive impairment.  No signs of DTs.  Continue thiamine, folic acid, multivitamin.  Follow.   DVT prophylaxis: Heparin Code Status: Full Family Communication: No family at bedside. Disposition Plan:  SNF when clinically improved.  Resolution of acute renal failure and when cleared by nephrology.   Consultants:   Nephrology: Dr. Holley Raring 06/11/2019  PCCM: Dr Lanney Gins 06/12/2019  Procedures:  Renal US 11/1 >> medical  renal disease of both kidneys, exophytic indeterminate nodule lateral aspect of the upper to mid left kidney 2.2 cm (query solid or complex cystic lesion, MR imaging recommended) ETOH 11/1 >> negative  [place] ABD Limited 11/1 >> no evidence of gallstones or hepatobiliary abnormality   Antimicrobials:   Azithromycin 06/12/2019>>>> 06/16/2019  IV Rocephin 06/12/2019>>>>> 06/16/2019   Subjective: Patient sleeping but arousable.  Denies any chest pain or shortness of breath.   Objective: Vitals:   06/24/19 1524 06/24/19 2043 06/25/19 0414 06/25/19 0825  BP: 134/77 (!) 161/86 139/81 (!) 159/89  Pulse: 79 81 76 73  Resp: _0 Temp: 98.8 F (37.1 C) 99.3 F (37.4 C) 99 F (37.2 C) 98.4 F (36.9 C)  TempSrc: Oral Oral Oral Oral  SpO2: 96% 98% 98% 97%  Weight:   81.6 kg   Height:        Intake/Output Summary (Last 24 hours) at 06/25/2019 1058 Last data filed at 06/25/2019 0418 Gross per 24 hour  Intake 120 ml  Output 1700 ml  Net -1580 ml   Filed Weights   06/23/19 0254 06/24/19 0339 06/25/19 0414  Weight: 90.6 kg 87.1 kg 81.6 kg    Examination:  General exam: NAD Respiratory system: CTAB.  No wheezes, no crackles, no rhonchi.  Normal respiratory effort.   Cardiovascular system: Regular rate rhythm no murmurs rubs or gallops.  No JVD.  Trace to 1+ bilateral lower extremity edema. Gastrointestinal system: Abdomen is soft, mildly distended, positive bowel sounds.  No rebound.  No guarding.  Central nervous system: Alert. No focal neurological deficits. Extremities: Symmetric 5 x 5 power. Skin: No rashes, lesions or ulcers Psychiatry: Judgement and insight appear poor. Mood & affect appropriate.     Data Reviewed: I have personally reviewed following labs and imaging studies  CBC: Recent Labs  Lab 06/19/19 0505 06/20/19 0619 06/22/19 0704 06/25/19 0448  WBC 6.9 7.7 5.9 7.8  NEUTROABS 4.7 5.6  --   --   HGB 9.7* 10.2* 10.5* 10.6*  HCT 30.3* 30.7* 31.4* 33.0*   MCV 88.1 86.0 84.9 88.7  PLT 217 254 288 315   Basic Metabolic Panel: Recent Labs  Lab 06/19/19 0505 06/20/19 0619 06/21/19 1805 06/22/19 0704 06/23/19 0603 06/24/19 0541 06/25/19 0448  NA 141 142  --  142 141 140 143  K 3.9 4.0  --  3.9 3.7 4.0 3.8  CL 109 109  --  109 108 106 106  CO2 25 24  --  _1 GLUCOSE 129* 100*  --  108* 111* 101* 110*  BUN 62* 50*  --  33* 31* 33* 31*  CREATININE 3.79* 3.34*  --  2.33* 2.14* 2.15* 1.92*  CALCIUM 8.5* 8.5*  --  9.2 9.8 10.2 10.3  MG 2.1 2.0 1.9 1.8 2.1 1.7 1.9  PHOS 5.6* 4.9*  --  5.0*  --  6.0* 7.3*   GFR: Estimated Creatinine Clearance: 49.6 mL/min (A) (by C-G formula based on SCr of 1.92 mg/dL (H)). Liver Function Tests: Recent Labs  Lab 06/19/19 0505 06/20/19 0619 06/22/19 0704 06/24/19 0541 06/25/19 0448  ALBUMIN 2.7* 2.9* 3.1* 3.3* 3.2*   No results for input(s): LIPASE, AMYLASE in the last 168 hours. No results for input(s): AMMONIA in the  last 168 hours. Coagulation Profile: No results for input(s): INR, PROTIME in the last 168 hours. Cardiac Enzymes: Recent Labs  Lab 06/21/19 1805  CKTOTAL 200   BNP (last 3 results) No results for input(s): PROBNP in the last 8760 hours. HbA1C: No results for input(s): HGBA1C in the last 72 hours. CBG: Recent Labs  Lab 06/22/19 0825 06/24/19 0808 06/24/19 2221  GLUCAP 88 108* 157*   Lipid Profile: No results for input(s): CHOL, HDL, LDLCALC, TRIG, CHOLHDL, LDLDIRECT in the last 72 hours. Thyroid Function Tests: No results for input(s): TSH, T4TOTAL, FREET4, T3FREE, THYROIDAB in the last 72 hours. Anemia Panel: No results for input(s): VITAMINB12, FOLATE, FERRITIN, TIBC, IRON, RETICCTPCT in the last 72 hours. Sepsis Labs: No results for input(s): PROCALCITON, LATICACIDVEN in the last 168 hours.  No results found for this or any previous visit (from the past 240 hour(s)).       Radiology Studies: No results found.      Scheduled Meds: .  amLODipine  10 mg Oral Daily  . cloNIDine  0.2 mg Transdermal Weekly  . feeding supplement (ENSURE ENLIVE)  237 mL Oral BID BM  . folic acid  1 mg Oral Daily  . furosemide  40 mg Intravenous Daily  . heparin  5,000 Units Subcutaneous Q8H  . hydrALAZINE  25 mg Oral Q8H  . metoprolol tartrate  25 mg Oral BID  . multivitamin with minerals  1 tablet Oral Daily  . sodium chloride flush  3 mL Intravenous Q12H  . thiamine  100 mg Oral Daily  . vitamin C  250 mg Oral BID   Continuous Infusions:    LOS: 15 days    Time spent: 35 minutes    Irine Seal, MD [facility]  If 7PM-7AM, please contact night-coverage [email or web address] 06/25/2019, 10:58 AM

## 2019-06-25 NOTE — Progress Notes (Signed)
Calorie Count  Note  Unable to calculate complete calorie count, receipts not collected.  48 hour calorie count ordered.  Diet: Dysphagia 2 fine chop Supplements: Ensure Enlive BID   Day 1:  Lunch: Unable to calculate Dinner: Unable to calculate Breakfast: 604 calories/ 24g protien 10:24am Total:604 calories/ 24 grams protien  Day 2: Lunch: 287 calories/ 12g protein 1:20pm Dinner: Unable to calculate Breakfast: 332 calories/ 14g protein  Total: 619 calories/ 26 grams protein  Total intake:  1223kcal (36% of estimated needs) 50 grams of protein (28% of estimated needs) This isn't reflective of a total day's intake. Meal ticket receipts missing, RD unable to complete an accurate count.   Estimated nutritional needs: Kcal: 1700-1900 Protein: 90-100 grams Fluid: >/= 1.7 L  Nutrition Dx: Inadequate oral intake related to social / environmental circumstances, poor appetite(history of EtOH abuse; intellectual disability) as evidenced by mild muscle depletion, mild fat depletion, other (comment)(30% avg po intake of last 8 documented meals).  Goal: Patient will meet greater than or equal to 90% of their needs  Intervention:  -Ensure Enlive po BID, each supplement provides 350 kcal and 20 grams of protein -Vitamin C 250 mg po BID Continue MVI, Vitamin B1, Folic acid  Meda Klinefelter, Dietetic Intern

## 2019-06-26 LAB — RENAL FUNCTION PANEL
Albumin: 3.2 g/dL — ABNORMAL LOW (ref 3.5–5.0)
Anion gap: 7 (ref 5–15)
BUN: 31 mg/dL — ABNORMAL HIGH (ref 6–20)
CO2: 29 mmol/L (ref 22–32)
Calcium: 10.3 mg/dL (ref 8.9–10.3)
Chloride: 105 mmol/L (ref 98–111)
Creatinine, Ser: 1.87 mg/dL — ABNORMAL HIGH (ref 0.61–1.24)
GFR calc Af Amer: 46 mL/min — ABNORMAL LOW (ref >60)
GFR calc non Af Amer: 39 mL/min — ABNORMAL LOW (ref >60)
Glucose, Bld: 133 mg/dL — ABNORMAL HIGH (ref 70–99)
Phosphorus: 5.7 mg/dL — ABNORMAL HIGH (ref 2.5–4.6)
Potassium: 3.5 mmol/L (ref 3.5–5.1)
Sodium: 141 mmol/L (ref 135–145)

## 2019-06-26 LAB — MAGNESIUM: Magnesium: 1.9 mg/dL (ref 1.7–2.4)

## 2019-06-26 MED ORDER — HYDRALAZINE HCL 50 MG PO TABS
50.0000 mg | ORAL_TABLET | Freq: Three times a day (TID) | ORAL | Status: DC
  Administered 2019-06-26 – 2019-06-27 (×4): 50 mg via ORAL
  Filled 2019-06-26 (×4): qty 1

## 2019-06-26 MED ORDER — MEGESTROL ACETATE 400 MG/10ML PO SUSP
400.0000 mg | Freq: Two times a day (BID) | ORAL | Status: DC
  Administered 2019-06-26 – 2019-06-27 (×3): 400 mg via ORAL
  Filled 2019-06-26 (×4): qty 10

## 2019-06-26 MED ORDER — INFLUENZA VAC SPLIT QUAD 0.5 ML IM SUSY: 0.5000 mL | PREFILLED_SYRINGE | INTRAMUSCULAR | Status: DC

## 2019-06-26 NOTE — Progress Notes (Signed)
Physical Therapy Treatment Patient Details Name: Martin Diaz MRN: [id number] DOB: October 19, 1960 Today's Date: 06/26/2019    History of Present Illness Pt is a 58 y.o. male presenting to hospital 06/10/19 with abdominal pain, generalized decline, generalized weakness, shakiness, vomiting, and diarrhea.  Pt admitted with acute renal failure, pancreatitis, hypocalcemia, and rhabdomyolysis.  Intubated 11/3 and extubated 11/5.  R IJ temporary HD catheter placed 11/3 and removed 11/10.  PMH includes alcohol use, cognitive impairment (pt chart pt's uncle reports pt functions at 71 y.o. level).    PT Comments    Pt requiring some encouragement to participate in therapy initially but then pt agreeable to trying to get to the chair.  Pt with difficulty initiating movement with UE's/LE's requiring 2 assist semi-supine to sitting edge of bed (pt initially appearing very "stiff" in general).  Pt initially with posterior lean sitting edge of bed requiring max assist for sitting balance but with repositioning improved to close SBA.  Then pt able to stand up to RW with min to mod assist x2 and then take steps with min assist x2 and walker use bed to recliner (pt with difficulty initiating steps with L LE more than R LE requiring assist for R weight shift and then physical assist to move L LE at times).  Will continue to focus on strengthening, initiation of movement, and progressing functional mobility per pt tolerance.    Follow Up Recommendations  SNF      Equipment Recommendations  Rolling walker with 5" wheels;3in1 (PT);Wheelchair (measurements PT);Wheelchair cushion (measurements PT)    Recommendations for Other Services OT consult     Precautions / Restrictions Precautions Precautions: Fall Precaution Comments: aspiration Restrictions Weight Bearing Restrictions: No    Mobility  Bed Mobility Overal bed mobility: Needs Assistance Bed Mobility: Supine to Sit     Supine to sit: +2 for physical  assistance;HOB elevated     General bed mobility comments: assist for trunk and B LE's d/t difficulty initiating movement  Transfers Overall transfer level: Needs assistance Equipment used: Rolling walker (2 wheeled) Transfers: Sit to/from Stand Sit to Stand: Min assist;Mod assist;+2 physical assistance         General transfer comment: vc's and tactile cues for UE/LE placement; assist to initiate and come to stand (pt with mild flexed posture even with vc's and tactile cues)  Ambulation/Gait Ambulation/Gait assistance: Min assist;+2 physical assistance Gait Distance (Feet): 3 Feet(bed to recliner) Assistive device: Rolling walker (2 wheeled)   Gait velocity: decreased   General Gait Details: pt with difficulty initiating taking steps with L LE more than R LE; vc's/tactile cues/visual demo for taking steps with each LE; assist to shift weight to R and physical assist to move L LE 25% of the time to take steps (otherwise very small stepping movement noted on L LE); increased time to perform   Stairs             Wheelchair Mobility    Modified Rankin (Stroke Patients Only)       Balance Overall balance assessment: Needs assistance Sitting-balance support: Bilateral upper extremity supported;Feet supported Sitting balance-Leahy Scale: Poor Sitting balance - Comments: pt initially requiring max assist for sitting balance d/t significant posterior lean but with cueing and assist (and repositioning B UE's to assist) pt able to sit with close SBA for safety   Standing balance support: Bilateral upper extremity supported Standing balance-Leahy Scale: Poor Standing balance comment: pt initially pushing B LE's against bed to stabilize in standing with B  UE support on RW but improved to CGA x2 standing with B UE support on RW (and LE's not pushing against bed)                            Cognition Arousal/Alertness: Awake/alert Behavior During Therapy: Flat  affect Overall Cognitive Status: History of cognitive impairments - at baseline                                 General Comments: Increased time to respond and initiate movement.  Inconsistent with following 1 step commands.  Oriented to person (name and month of birthday only).      Exercises      General Comments   Nursing cleared pt for participation in physical therapy.  Pt agreeable to PT session with encouragement.  Family arrived end of session (pt's aunt and uncle).      Pertinent Vitals/Pain Pain Assessment: Faces Faces Pain Scale: No hurt Pain Intervention(s): Limited activity within patient's tolerance;Monitored during session;Repositioned  Vitals (HR and O2 on room air) stable and WFL throughout treatment session.    Home Living                      Prior Function            PT Goals (current goals can now be found in the care plan section) Acute Rehab PT Goals Patient Stated Goal: to go home PT Goal Formulation: With patient Time For Goal Achievement: 07/04/19 Potential to Achieve Goals: Fair Progress towards PT goals: Progressing toward goals    Frequency    Min 2X/week      PT Plan Current plan remains appropriate    Co-evaluation              AM-PAC PT "6 Clicks" Mobility   Outcome Measure  Help needed turning from your back to your side while in a flat bed without using bedrails?: A Lot Help needed moving from lying on your back to sitting on the side of a flat bed without using bedrails?: A Lot Help needed moving to and from a bed to a chair (including a wheelchair)?: Total Help needed standing up from a chair using your arms (e.g., wheelchair or bedside chair)?: Total Help needed to walk in hospital room?: Total Help needed climbing 3-5 steps with a railing? : Total 6 Click Score: 8    End of Session Equipment Utilized During Treatment: Gait belt Activity Tolerance: Patient tolerated treatment well Patient  left: in chair;with call bell/phone within reach;with chair alarm set;with family/visitor present Nurse Communication: Mobility status;Precautions PT Visit Diagnosis: Other abnormalities of gait and mobility (R26.89);Difficulty in walking, not elsewhere classified (R26.2);Muscle weakness (generalized) (M62.81)     Time: 1435-1500 PT Time Calculation (min) (ACUTE ONLY): 25 min  Charges:  $Therapeutic Activity: 23-37 mins                    Leitha Bleak, PT 06/26/19, 3:16 PM [phone]

## 2019-06-26 NOTE — Progress Notes (Signed)
PROGRESS NOTE    Martin Diaz  XBJ:[id number] DOB: May 14, 1961 DOA: 06/10/2019 PCP: Patient, No Pcp Per   Brief Narrative:  Martin Diaz is a 58 y.o. M with a history of ETOH abuse and intellectual disability (lives in a boarding house, Aunt/Uncle guardian) admitted 11/1 with reports of several days of N/V/D, shaking and weakness.    In the ER, Cr 11.56, BUN 188.  HCO3 17, AST 450 / ALT 237, alk phos 77, lipase 339. RUQ [place] negative for gallstones or hepatobiliary abnormality.  Started IV fluids for rhabdo, AKI.  Renal US negative for obstruction. Developed worsening mental status and intubated for airway protection   Assessment & Plan:   Principal Problem:   Acute renal failure (ARF) (HCC) Active Problems:   Pancreatitis, alcoholic, acute   Alcohol use   Hypocalcemia   Non-traumatic rhabdomyolysis   Metabolic acidosis  1 acute renal failure/rhabdomyolysis/metabolic acidosis/left renal cyst (baseline creatinine 0.8) Acute renal failure likely secondary to a prerenal azotemia secondary to volume depletion and rhabdomyolysis.  Patient noted to have a CK level of 24,000 on admission.  Renal ultrasound which was done was negative for hydronephrosis.  Nephrology was consulted who are following the patient.  Patient was started on CRRT and subsequently transition to IHD.  Last HD was 06/14/2019.  Hemodialysis catheter  removed per nephrology.  Patient on Lasix 40 mg IV daily per nephrology.  Patient with a urine output of 1 L over the past 24 hours.  Patient is -10.424 L during this hospitalization.  Renal function started to trend back down and creatinine currently at 1.87 from 1.92 from 2.15 from 2.14 from 2.33 from 3.34 (06/20/2019) from 11.56 on admission.  Nephrology following and appreciate their input and recommendations.  2.  Metabolic acidosis Likely secondary to problem #1.  Resolved with bicarb drip.   3.  Agitated delirium/baseline cognitive delay Felt likely secondary to  alcohol withdrawal or ICU delirium superimposed on the baseline intellectual disability.  Patient noted per prior physician to be very impulsive pulling out lines and required sedation with Precedex to prevent inadvertently pulling out his dialysis catheter.  Precedex has been discontinued.  Patient currently on clonidine.  Follow.  4.  Acute respiratory insufficiency in the setting of AMS/possible aspiration Patient was intubated for airway protection in the setting of rhabdomyolysis and acute renal failure.  Patient extubated on 06/14/2019.  Patient was treated empirically for possible aspiration pneumonia completed course of antibiotic treatment.  Patient currently afebrile.  Patient is not hypoxic, with sats of 98% on room air.  No further antibiotics needed at this time.  5.  Hypertension Blood pressure improving with addition of hydralazine.  Continue current dose of clonidine patch, Lopressor twice daily, Norvasc 10 mg daily.  Increase hydralazine to 50 mg 3 times daily.  IV labetalol and IV hydralazine as needed.    6.  Hypocalcemia Resolved.  7.  Pancreatitis Lipase noted to be 339 on admission.  Patient history of alcohol abuse.  Patient required Precedex drips during the hospitalization currently off Precedex.  Tolerating oral intake.  Resolved.  8.  History of alcohol abuse Prior MD felt patient was likely being given alcohol to him by his family members or friends when he was out of his group home.  Patient unable to quantify amount of alcohol intake likely due to baseline cognitive impairment.  No signs of DTs.  Continue thiamine, folic acid, multivitamin.  Follow.   DVT prophylaxis: Heparin Code Status: Full Family Communication: No family  at bedside. Disposition Plan: SNF when clinically improved.  Resolution of acute renal failure and when cleared by nephrology.   Consultants:   Nephrology: Dr. Holley Raring 06/11/2019  PCCM: Dr Lanney Gins 06/12/2019  Procedures:  Renal US 11/1  >> medical renal disease of both kidneys, exophytic indeterminate nodule lateral aspect of the upper to mid left kidney 2.2 cm (query solid or complex cystic lesion, MR imaging recommended) ETOH 11/1 >> negative  [place] ABD Limited 11/1 >> no evidence of gallstones or hepatobiliary abnormality   Antimicrobials:   Azithromycin 06/12/2019>>>> 06/16/2019  IV Rocephin 06/12/2019>>>>> 06/16/2019   Subjective: Patient asleep.  Arousable.  Denies chest pain or shortness of breath.  Drifts back off to sleep.    Objective: Vitals:   06/25/19 2030 06/26/19 0446 06/26/19 0449 06/26/19 0740  BP: (!) 154/83  (!) 167/85 (!) 155/92  Pulse: 87  66 74  Resp: 19  19 16   Temp: 98.5 F (36.9 C)  99 F (37.2 C) 98.4 F (36.9 C)  TempSrc: Oral  Oral Oral  SpO2: 98%  99% 99%  Weight:  81.6 kg    Height:        Intake/Output Summary (Last 24 hours) at 06/26/2019 1203 Last data filed at 06/26/2019 0446 Gross per 24 hour  Intake -  Output 600 ml  Net -600 ml   Filed Weights   06/24/19 0339 06/25/19 0414 06/26/19 0446  Weight: 87.1 kg 81.6 kg 81.6 kg    Examination:  General exam: NAD Respiratory system: Lungs clear to auscultation bilaterally.  No wheezes, no crackles, no rhonchi.  Normal respiratory effort.  Cardiovascular system: RRR no murmurs rubs or gallops.  No JVD.  No lower extremity edema.  Gastrointestinal system: Abdomen is soft, mildly distended, positive bowel sounds.  No rebound.  No guarding.  Central nervous system: No focal neurological deficits. Extremities: Symmetric 5 x 5 power. Skin: No rashes, lesions or ulcers Psychiatry: Judgement and insight appear poor. Mood & affect appropriate.     Data Reviewed: I have personally reviewed following labs and imaging studies  CBC: Recent Labs  Lab 06/20/19 0619 06/22/19 0704 06/25/19 0448  WBC 7.7 5.9 7.8  NEUTROABS 5.6  --   --   HGB 10.2* 10.5* 10.6*  HCT 30.7* 31.4* 33.0*  MCV 86.0 84.9 88.7  PLT 254 288 062   Basic  Metabolic Panel: Recent Labs  Lab 06/20/19 0619  06/22/19 0704 06/23/19 0603 06/24/19 0541 06/25/19 0448 06/26/19 0615  NA 142  --  142 141 140 143 141  K 4.0  --  3.9 3.7 4.0 3.8 3.5  CL 109  --  109 108 106 106 105  CO2 24  --  24 25 24 25 29   GLUCOSE 100*  --  108* 111* 101* 110* 133*  BUN 50*  --  33* 31* 33* 31* 31*  CREATININE 3.34*  --  2.33* 2.14* 2.15* 1.92* 1.87*  CALCIUM 8.5*  --  9.2 9.8 10.2 10.3 10.3  MG 2.0   < > 1.8 2.1 1.7 1.9 1.9  PHOS 4.9*  --  5.0*  --  6.0* 7.3* 5.7*   < > = values in this interval not displayed.   GFR: Estimated Creatinine Clearance: 50.9 mL/min (A) (by C-G formula based on SCr of 1.87 mg/dL (H)). Liver Function Tests: Recent Labs  Lab 06/20/19 0619 06/22/19 0704 06/24/19 0541 06/25/19 0448 06/26/19 0615  ALBUMIN 2.9* 3.1* 3.3* 3.2* 3.2*   No results for input(s): LIPASE, AMYLASE in the  last 168 hours. No results for input(s): AMMONIA in the last 168 hours. Coagulation Profile: No results for input(s): INR, PROTIME in the last 168 hours. Cardiac Enzymes: Recent Labs  Lab 06/21/19 1805  CKTOTAL 200   BNP (last 3 results) No results for input(s): PROBNP in the last 8760 hours. HbA1C: No results for input(s): HGBA1C in the last 72 hours. CBG: Recent Labs  Lab 06/22/19 0825 06/24/19 0808 06/24/19 2221  GLUCAP 88 108* 157*   Lipid Profile: No results for input(s): CHOL, HDL, LDLCALC, TRIG, CHOLHDL, LDLDIRECT in the last 72 hours. Thyroid Function Tests: No results for input(s): TSH, T4TOTAL, FREET4, T3FREE, THYROIDAB in the last 72 hours. Anemia Panel: No results for input(s): VITAMINB12, FOLATE, FERRITIN, TIBC, IRON, RETICCTPCT in the last 72 hours. Sepsis Labs: No results for input(s): PROCALCITON, LATICACIDVEN in the last 168 hours.  No results found for this or any previous visit (from the past 240 hour(s)).       Radiology Studies: No results found.      Scheduled Meds: . amLODipine  10 mg Oral Daily   . cloNIDine  0.2 mg Transdermal Weekly  . feeding supplement (ENSURE ENLIVE)  237 mL Oral BID BM  . folic acid  1 mg Oral Daily  . furosemide  40 mg Intravenous Daily  . heparin  5,000 Units Subcutaneous Q8H  . hydrALAZINE  50 mg Oral Q8H  . [START ON 06/27/2019] influenza vac split quadrivalent PF  0.5 mL Intramuscular Tomorrow-1000  . metoprolol tartrate  25 mg Oral BID  . multivitamin with minerals  1 tablet Oral Daily  . sodium chloride flush  3 mL Intravenous Q12H  . thiamine  100 mg Oral Daily  . vitamin C  250 mg Oral BID   Continuous Infusions:    LOS: 16 days    Time spent: 35 minutes    Irine Seal, MD [facility]  If 7PM-7AM, please contact night-coverage [email or web address] 06/26/2019, 12:03 PM

## 2019-06-26 NOTE — TOC Progression Note (Addendum)
Transition of Care [facility]) - Progression Note    Patient Details  Name: Martin Diaz MRN: [id number] Date of Birth: July 02, 1961  Transition of Care [facility]) CM/SW Contact  Ross Ludwig, [place] Phone Number: 06/26/2019, 12:28 PM  Clinical Narrative:    CSW received phone call from Pamala Hurry [phone], [place], they requested clinical information to be sent to facility, [facility] faxed requested clinicals to [phone].  1:30pm  CSW received phone call from [facility], they have declined patient.  CSW updated patient's Aunt and Uncle who were at bedside.  CSW presented other bed offers, patient's Aunt has chosen [facility]  CSW contacted [facility] and they can accept patient once he is medically ready for discharge and once negative Covid test results are received.   Expected Discharge Plan: [place] Barriers to Discharge: Continued Medical Work up  Expected Discharge Plan and Services Expected Discharge Plan: [place] In-house Referral: Clinical Social Work   Post Acute Care Choice: [place] Living arrangements for the past 2 months: [place]                 DME Arranged: N/A         HH Arranged: NA           Social Determinants of Health (SDOH) Interventions    Readmission Risk Interventions No flowsheet data found.

## 2019-06-26 NOTE — Plan of Care (Signed)
Patient still not having a good appetite, prefers ice cream, and ensures over his meal trays. He does also require assistance at meals.   Problem: Clinical Measurements: Goal: Ability to maintain clinical measurements within normal limits will improve Outcome: Progressing   Problem: Safety: Goal: Ability to remain free from injury will improve Outcome: Progressing

## 2019-06-26 NOTE — Progress Notes (Signed)
Nutrition Follow Up Note   DOCUMENTATION CODES:   Not applicable  INTERVENTION:   -Ensure Enlive po BID, each supplement provides 350 kcal and 20 grams of protein  -Vitamin C 250 mg po BID  Continue MVI, Vitamin B1, Folic acid  Recommend consider appetite stimulant   NUTRITION DIAGNOSIS:   Inadequate oral intake related to social / environmental circumstances, poor appetite(history of EtOH abuse; intellectual disability) as evidenced by mild muscle depletion, mild fat depletion, other (comment)(30% avg po intake of last 8 documented meals).  GOAL:   Patient will meet greater than or equal to 90% of their needs  -not met   MONITOR:   PO intake, I & O's, Skin, Supplement acceptance, Labs, Weight trends  ASSESSMENT:   58 y.o. M with a history of ETOH abuse and intellectual disability (lives in a boarding house, Aunt/Uncle guardian) admitted 11/1 with reports of several days of N/V/D, shaking and weakness.   Calorie count completed yesterday; pt meeting <50% of his estimated needs via oral intake. Pt is drinking some Ensure. Would recommend consider appetite stimulant. Per chart, pt is down ~23lbs since admit. Unsure what pt's UBW is as there is no documented weight history in chart.   Medications reviewed and include: folic acid, lasix, MVI, thiamine, vitamin C  Labs reviewed: BUN 31(H), creat 1.87(H), P 5.7(H), Mg 1.9 wnl Hgb 10.6(L), Hct 33.0(L)  Diet Order:   Diet Order            DIET DYS 2 Room service appropriate? Yes with Assist; Fluid consistency: Thin  Diet effective now             EDUCATION NEEDS:   Not appropriate for education at this time  Skin:  Skin Assessment: Reviewed RN Assessment  Last BM:  11/17- type 4  Height:   Ht Readings from Last 1 Encounters:  06/11/19 6' 4"  (1.93 m)    Weight:   Wt Readings from Last 1 Encounters:  06/26/19 81.6 kg    Ideal Body Weight:  91.8 kg  BMI:  Body mass index is 21.91 kg/m.  Estimated  Nutritional Needs:   Kcal:  2250-2430 (MSJ 1.2-1.3)  Protein:  113-121  Fluid:  >/= 2.2 L/day  Koleen Distance MS, RD, LDN Pager #- [phone] Office#- [phone] After Hours Pager: [phone]

## 2019-06-26 NOTE — Progress Notes (Signed)
[facility]  ROUNDING NOTE   Subjective:   Length of stay: 16 days 11/16 0701 - 11/17 0700 In: -  Out: 600 [Urine:600] Denies any acute complaints Serum creatinine slightly improved   Objective:  Vital signs in last 24 hours:  Temp:  [97.6 F (36.4 C)-99 F (37.2 C)] 97.9 F (36.6 C) (11/17 1627) Pulse Rate:  [66-94] 94 (11/17 1627) Resp:  [16-20] 18 (11/17 1627) BP: (142-167)/(81-92) 142/81 (11/17 1627) SpO2:  [98 %-99 %] 98 % (11/17 1627) Weight:  [81.6 kg] 81.6 kg (11/17 0446)  Weight change: 0 kg Filed Weights   06/24/19 0339 06/25/19 0414 06/26/19 0446  Weight: 87.1 kg 81.6 kg 81.6 kg    Intake/Output: I/O last 3 completed shifts: In: -  Out: 1300 [Urine:1300]   Intake/Output this shift:  Total I/O In: 240 [P.O.:240] Out: 1000 [Urine:1000]  Physical Exam: General:  laying in bed   Head: Normocephalic atraumatic  Eyes: Anicteric  Neck: Supple,   Lungs:  Clear to auscultation   Heart: regular  Abdomen:  Soft, nontender, bowel sounds present  Extremities: No peripheral edema.  Neurologic:  Lethargic, answers few questions, mumbles  Skin: No lesions        Basic Metabolic Panel: Recent Labs  Lab 06/20/19 0619  06/22/19 0704 06/23/19 0603 06/24/19 0541 06/25/19 0448 06/26/19 0615  NA 142  --  142 141 140 143 141  K 4.0  --  3.9 3.7 4.0 3.8 3.5  CL 109  --  109 108 106 106 105  CO2 24  --  24 25 24 25 29   GLUCOSE 100*  --  108* 111* 101* 110* 133*  BUN 50*  --  33* 31* 33* 31* 31*  CREATININE 3.34*  --  2.33* 2.14* 2.15* 1.92* 1.87*  CALCIUM 8.5*  --  9.2 9.8 10.2 10.3 10.3  MG 2.0   < > 1.8 2.1 1.7 1.9 1.9  PHOS 4.9*  --  5.0*  --  6.0* 7.3* 5.7*   < > = values in this interval not displayed.    Liver Function Tests: Recent Labs  Lab 06/20/19 0619 06/22/19 0704 06/24/19 0541 06/25/19 0448 06/26/19 0615  ALBUMIN 2.9* 3.1* 3.3* 3.2* 3.2*   No results for input(s): LIPASE, AMYLASE in the last 168 hours. No results for  input(s): AMMONIA in the last 168 hours.  CBC: Recent Labs  Lab 06/20/19 0619 06/22/19 0704 06/25/19 0448  WBC 7.7 5.9 7.8  NEUTROABS 5.6  --   --   HGB 10.2* 10.5* 10.6*  HCT 30.7* 31.4* 33.0*  MCV 86.0 84.9 88.7  PLT 254 288 279    Cardiac Enzymes: Recent Labs  Lab 06/21/19 1805  CKTOTAL 200    BNP: Invalid input(s): POCBNP  CBG: Recent Labs  Lab 06/22/19 0825 06/24/19 0808 06/24/19 2221  GLUCAP 36 108* 157*    Microbiology: Results for orders placed or performed during the hospital encounter of 06/10/19  SARS Coronavirus 2 by RT PCR (hospital order, performed in [place] hospital lab)     Status: None   Collection Time: 06/10/19 10:02 PM  Result Value Ref Range Status   SARS Coronavirus 2 NEGATIVE NEGATIVE Final    Comment: (NOTE) If result is NEGATIVE SARS-CoV-2 target nucleic acids are NOT DETECTED. The SARS-CoV-2 RNA is generally detectable in upper and lower  respiratory specimens during the acute phase of infection. The lowest  concentration of SARS-CoV-2 viral copies this assay can detect is 250  copies / mL.  A negative result does not preclude SARS-CoV-2 infection  and should not be used as the sole basis for treatment or other  patient management decisions.  A negative result may occur with  improper specimen collection / handling, submission of specimen other  than nasopharyngeal swab, presence of viral mutation(s) within the  areas targeted by this assay, and inadequate number of viral copies  (<250 copies / mL). A negative result must be combined with clinical  observations, patient history, and epidemiological information. If result is POSITIVE SARS-CoV-2 target nucleic acids are DETECTED. The SARS-CoV-2 RNA is generally detectable in upper and lower  respiratory specimens dur ing the acute phase of infection.  Positive  results are indicative of active infection with SARS-CoV-2.  Clinical  correlation with patient history and other  diagnostic information is  necessary to determine patient infection status.  Positive results do  not rule out bacterial infection or co-infection with other viruses. If result is PRESUMPTIVE POSTIVE SARS-CoV-2 nucleic acids MAY BE PRESENT.   A presumptive positive result was obtained on the submitted specimen  and confirmed on repeat testing.  While 2019 novel coronavirus  (SARS-CoV-2) nucleic acids may be present in the submitted sample  additional confirmatory testing may be necessary for epidemiological  and / or clinical management purposes  to differentiate between  SARS-CoV-2 and other Sarbecovirus currently known to infect humans.  If clinically indicated additional testing with an alternate test  methodology [phone]) is advised. The SARS-CoV-2 RNA is generally  detectable in upper and lower respiratory sp ecimens during the acute  phase of infection. The expected result is Negative. Fact Sheet for Patients:  StrictlyIdeas.no Fact Sheet for Healthcare Providers: BankingDealers.co.za This test is not yet approved or cleared by the [place] FDA and has been authorized for detection and/or diagnosis of SARS-CoV-2 by FDA under an Emergency Use Authorization (EUA).  This EUA will remain in effect (meaning this test can be used) for the duration of the COVID-19 declaration under Section 564(b)(1) of the Act, 21 U.S.C. section 360bbb-3(b)(1), unless the authorization is terminated or revoked sooner. Performed at [facility], [place] [address] [place], [place] [address]   MRSA PCR Screening     Status: None   Collection Time: 06/11/19 12:30 PM   Specimen: Nasopharyngeal  Result Value Ref Range Status   MRSA by PCR NEGATIVE NEGATIVE Final    Comment:        The GeneXpert MRSA Assay (FDA approved for NASAL specimens only), is one component of a comprehensive MRSA colonization surveillance program. It is not intended to  diagnose MRSA infection nor to guide or monitor treatment for MRSA infections. Performed at [facility], [address] [place], [place] [address]   CULTURE, BLOOD (ROUTINE X 2) w Reflex to ID Panel     Status: None   Collection Time: 06/12/19  1:34 PM   Specimen: BLOOD  Result Value Ref Range Status   Specimen Description BLOOD BLOOD RIGHT HAND  Final   Special Requests   Final    BOTTLES DRAWN AEROBIC AND ANAEROBIC Blood Culture adequate volume   Culture   Final    NO GROWTH 5 DAYS Performed at [facility], [address] [place], [place] [address]    Report Status 06/17/2019 FINAL  Final  CULTURE, BLOOD (ROUTINE X 2) w Reflex to ID Panel     Status: None   Collection Time: 06/12/19  1:35 PM   Specimen: BLOOD  Result Value Ref  Range Status   Specimen Description BLOOD BLOOD LEFT FOREARM  Final   Special Requests   Final    BOTTLES DRAWN AEROBIC AND ANAEROBIC Blood Culture adequate volume   Culture   Final    NO GROWTH 5 DAYS Performed at [facility], [address], [place], [place] [address]    Report Status 06/17/2019 FINAL  Final  Culture, respiratory (non-expectorated)     Status: None   Collection Time: 06/13/19 12:10 AM   Specimen: Tracheal Aspirate; Respiratory  Result Value Ref Range Status   Specimen Description   Final    TRACHEAL ASPIRATE Performed at [facility], [address], [place], [place] [address]    Special Requests   Final    NONE Performed at [facility], [place], [place] [address]    Gram Stain   Final    RARE WBC PRESENT, PREDOMINANTLY PMN MODERATE GRAM POSITIVE COCCI IN PAIRS IN CLUSTERS FEW GRAM NEGATIVE RODS    Culture   Final    FEW Consistent with normal respiratory flora. Performed at [facility], [place] [address], [place],  [address]    Report Status 06/15/2019 FINAL  Final    Coagulation Studies: No results for input(s): LABPROT, INR in the  last 72 hours.  Urinalysis: No results for input(s): COLORURINE, LABSPEC, PHURINE, GLUCOSEU, HGBUR, BILIRUBINUR, KETONESUR, PROTEINUR, UROBILINOGEN, NITRITE, LEUKOCYTESUR in the last 72 hours.  Invalid input(s): APPERANCEUR    Imaging: No results found.   Medications:    . amLODipine  10 mg Oral Daily  . cloNIDine  0.2 mg Transdermal Weekly  . feeding supplement (ENSURE ENLIVE)  237 mL Oral BID BM  . folic acid  1 mg Oral Daily  . furosemide  40 mg Intravenous Daily  . heparin  5,000 Units Subcutaneous Q8H  . hydrALAZINE  50 mg Oral Q8H  . [START ON 06/27/2019] influenza vac split quadrivalent PF  0.5 mL Intramuscular Tomorrow-1000  . megestrol  400 mg Oral BID  . metoprolol tartrate  25 mg Oral BID  . multivitamin with minerals  1 tablet Oral Daily  . sodium chloride flush  3 mL Intravenous Q12H  . thiamine  100 mg Oral Daily  . vitamin C  250 mg Oral BID   ALPRAZolam, hydrALAZINE, ipratropium-albuterol, labetalol, ondansetron **OR** ondansetron (ZOFRAN) IV  Assessment/ Plan:  58 y.o.black male with alcohol abuse and educational disability who was admitted to [facility] on 06/10/2019 for evaluation of weakness, diarrhea, nausea, and vomiting.    1.  Acute kidney injury with metabolic acidosis suspected secondary to prolonged volume depletion (prerenal azotemia) and rhabdomyolysis.  admiit Cr 11.5, BUN 188 on 06/10/2019 Baseline creatinine of 0.78, normal GFR on 01/2018 Patient required continuous renal placement therapy  - nonoliguric urine output.  - Encourage PO intake -Serum creatinine is improving slowly, will monitor closely -Currently also getting IV furosemide  2.  Anemia with renal failure:   No indication for ESA.  Lab Results  Component Value Date   HGB 10.6 (L) 06/25/2019       LOS: 16 Arlayne Liggins 11/17/20205:21 PM

## 2019-06-27 LAB — RENAL FUNCTION PANEL
Albumin: 3.1 g/dL — ABNORMAL LOW (ref 3.5–5.0)
Anion gap: 11 (ref 5–15)
BUN: 31 mg/dL — ABNORMAL HIGH (ref 6–20)
CO2: 29 mmol/L (ref 22–32)
Calcium: 10.5 mg/dL — ABNORMAL HIGH (ref 8.9–10.3)
Chloride: 102 mmol/L (ref 98–111)
Creatinine, Ser: 1.73 mg/dL — ABNORMAL HIGH (ref 0.61–1.24)
GFR calc Af Amer: 50 mL/min — ABNORMAL LOW (ref >60)
GFR calc non Af Amer: 43 mL/min — ABNORMAL LOW (ref >60)
Glucose, Bld: 117 mg/dL — ABNORMAL HIGH (ref 70–99)
Phosphorus: 5.7 mg/dL — ABNORMAL HIGH (ref 2.5–4.6)
Potassium: 3.5 mmol/L (ref 3.5–5.1)
Sodium: 142 mmol/L (ref 135–145)

## 2019-06-27 LAB — MAGNESIUM: Magnesium: 1.7 mg/dL (ref 1.7–2.4)

## 2019-06-27 LAB — SARS CORONAVIRUS 2 (TAT 6-24 HRS): SARS Coronavirus 2: NEGATIVE

## 2019-06-27 MED ORDER — MEGESTROL ACETATE 400 MG/10ML PO SUSP: 400.0000 mg | Freq: Two times a day (BID) | ORAL | 0 refills | Status: DC

## 2019-06-27 MED ORDER — ASCORBIC ACID 250 MG PO TABS: 250.0000 mg | ORAL_TABLET | Freq: Two times a day (BID) | ORAL | 0 refills | Status: DC

## 2019-06-27 MED ORDER — THIAMINE HCL 100 MG PO TABS: 100.0000 mg | ORAL_TABLET | Freq: Every day | ORAL | 0 refills | Status: DC

## 2019-06-27 MED ORDER — CLONIDINE 0.2 MG/24HR TD PTWK: 0.2000 mg | MEDICATED_PATCH | TRANSDERMAL | 12 refills | Status: AC

## 2019-06-27 MED ORDER — ENSURE ENLIVE PO LIQD: 237.0000 mL | Freq: Two times a day (BID) | ORAL | 12 refills | Status: AC

## 2019-06-27 MED ORDER — METOPROLOL TARTRATE 25 MG PO TABS: 25.0000 mg | ORAL_TABLET | Freq: Two times a day (BID) | ORAL | 0 refills | Status: AC

## 2019-06-27 MED ORDER — HYDRALAZINE HCL 50 MG PO TABS: 50.0000 mg | ORAL_TABLET | Freq: Three times a day (TID) | ORAL | 0 refills | Status: AC

## 2019-06-27 MED ORDER — ADULT MULTIVITAMIN W/MINERALS CH: 1.0000 | ORAL_TABLET | Freq: Every day | ORAL | 0 refills | Status: DC

## 2019-06-27 MED ORDER — AMLODIPINE BESYLATE 10 MG PO TABS: 10.0000 mg | ORAL_TABLET | Freq: Every day | ORAL | 0 refills | Status: AC

## 2019-06-27 MED ORDER — FOLIC ACID 1 MG PO TABS: 1.0000 mg | ORAL_TABLET | Freq: Every day | ORAL | 0 refills | Status: AC

## 2019-06-27 NOTE — TOC Transition Note (Addendum)
Transition of Care [facility]) - CM/SW Discharge Note   Patient Details  Name: Suleman Wools MRN: [id number] Date of Birth: 12-27-1960  [facility]) CM/SW Contact:  Ross Ludwig, LCSW Phone Number: 06/27/2019, 2:52 PM   Clinical Narrative:     Patient to be d/c'ed today to [facility] [place] [facility] room 801B.  Patient and family agreeable to plans will transport via ems RN to call report [phone].  CSW spoke with patient's Ethlyn Daniels to inform her that patient will be discharging today.   Final next level of care: Skilled Nursing Facility Barriers to Discharge: Barriers Resolved   Patient Goals and CMS Choice Patient states their goals for this hospitalization and ongoing recovery are:: Patient plans to go to SNF for short term rehab, then return back home home or maybe a higher level of care. CMS Medicare.gov Compare Post Acute Care list provided to:: Patient Represenative (must comment) Choice offered to / list presented to : [place] / Guardian  Discharge Placement PASRR number recieved: 06/25/19            Patient chooses bed at: [facility] Patient to be transferred to facility by: [facility] Name of family member notified: Randon Goldsmith [phone] Patient and family notified of of transfer: 06/27/19  Discharge Plan and Services In-house Referral: Clinical Social Work   Post Acute Care Choice: [place]          DME Arranged: N/A DME Agency: NA     Representative spoke with at DME Agency: na HH Arranged: NA          Social Determinants of Health ([place]) Interventions     Readmission Risk Interventions No flowsheet data found.

## 2019-06-27 NOTE — Discharge Summary (Signed)
Physician Discharge Summary  Martin Diaz DQQ:[id number] DOB: 02-19-1961 DOA: 06/10/2019  PCP: Patient, No Pcp Per  Admit date: 06/10/2019 Discharge date: 06/27/2019  Admitted From: Home  Disposition:  SNF - [place]  Recommendations for Outpatient Follow-up:  1. Follow up with PCP in 1-2 weeks 2. Please obtain BMP/CBC in one week  Home Health: No Equipment/Devices: None  Discharge Condition: Stable CODE STATUS: FULL Diet recommendation: Dysphagia level 2, thin liquids, assist with meals  Brief/Interim Summary:  Martin Diaz is a58 y.o.M with a history of ETOH abuse and intellectual disability (lives in a boarding house, Aunt/Uncle guardian) admitted 11/1 with reports of several days of N/V/D, shaking and weakness.  In the ER, Cr 11.56, BUN 188. HCO3 17, AST 450 / ALT 237, alk phos 77, lipase 339. RUQ [place] negative for gallstones or hepatobiliary abnormality. Started IV fluids for rhabdo, AKI. Renal US negative for obstruction. Developed worsening mental status and intubated for airway protection  1 acute renal failure/rhabdomyolysis/metabolic acidosis/left renal cyst (baseline creatinine 0.8) - improved Acute renal failure likely secondary to a prerenal azotemia secondary to volume depletion and rhabdomyolysis.  Patient noted to have a CK level of 24,000 on admission.  Renal ultrasound which was done was negative for hydronephrosis.  Nephrology was consulted who are following the patient.  Patient was started on CRRT and subsequently transition to IHD.  Last HD was 06/14/2019.  Hemodialysis catheter  removed per nephrology.  Patient diuresed and had net fluid balance of -10L over course of admission.  Nephrology consulted.  Renal function improved with Creatinine 1.73 upon discharge.  2.  Metabolic acidosis Likely secondary to problem #1.  Resolved with bicarb drip.   3.  Agitated delirium/baseline cognitive delay Felt likely secondary to alcohol withdrawal or ICU delirium  superimposed on the baseline intellectual disability.  Patient noted per prior physician to be very impulsive pulling out lines and required sedation with Precedex to prevent inadvertently pulling out his dialysis catheter.  Precedex has been discontinued.  Patient currently on clonidine.    4.  Acute respiratory insufficiency in the setting of AMS/possible aspiration Patient was intubated for airway protection in the setting of rhabdomyolysis and acute renal failure.  Patient extubated on 06/14/2019.  Patient was treated empirically for possible aspiration pneumonia completed course of antibiotic treatment.  Patient currently afebrile.  Patient is not hypoxic, with sats of 98% on room air.  No further antibiotics needed at this time.  5.  Hypertension Blood pressure improving with addition of hydralazine.  Continue current dose of clonidine patch, Lopressor twice daily, Norvasc 10 mg daily.  Increase hydralazine to 50 mg 3 times daily.  IV labetalol and IV hydralazine as needed.    6.  Hypocalcemia Resolved.  7.  Pancreatitis Lipase noted to be 339 on admission.  Patient history of alcohol abuse.  Patient required Precedex drips during the hospitalization currently off Precedex.  Tolerating oral intake.  Resolved.  8.  History of alcohol abuse Prior MD felt patient was likely being given alcohol to him by his family members or friends when he was out of his group home.  Patient unable to quantify amount of alcohol intake likely due to baseline cognitive impairment.  No signs of DTs.  Continue thiamine, folic acid, multivitamin.  Follow.  Discharge Diagnoses: Principal Problem:   Acute renal failure (ARF) (HCC) Active Problems:   Pancreatitis, alcoholic, acute   Alcohol use   Hypocalcemia   Non-traumatic rhabdomyolysis   Metabolic acidosis    Discharge  Instructions   Discharge Instructions    Call MD for:  severe uncontrolled pain   Complete by: As directed    Call MD for:   temperature >100.4   Complete by: As directed    Diet - low sodium heart healthy   Complete by: As directed    Increase activity slowly   Complete by: As directed      Allergies as of 06/27/2019   No Known Allergies     Medication List    TAKE these medications   amLODipine 10 MG tablet Commonly known as: NORVASC Take 1 tablet (10 mg total) by mouth daily. Start taking on: June 28, 2019   ascorbic acid 250 MG tablet Commonly known as: VITAMIN C Take 1 tablet (250 mg total) by mouth 2 (two) times daily.   cloNIDine 0.2 mg/24hr patch Commonly known as: CATAPRES - Dosed in mg/24 hr Place 1 patch (0.2 mg total) onto the skin once a week. Start taking on: July 01, 2019   feeding supplement (ENSURE ENLIVE) Liqd Take 237 mLs by mouth 2 (two) times daily between meals.   folic acid 1 MG tablet Commonly known as: FOLVITE Take 1 tablet (1 mg total) by mouth daily. Start taking on: June 28, 2019   hydrALAZINE 50 MG tablet Commonly known as: APRESOLINE Take 1 tablet (50 mg total) by mouth every 8 (eight) hours.   megestrol 400 MG/10ML suspension Commonly known as: MEGACE Take 10 mLs (400 mg total) by mouth 2 (two) times daily.   metoprolol tartrate 25 MG tablet Commonly known as: LOPRESSOR Take 1 tablet (25 mg total) by mouth 2 (two) times daily.   multivitamin with minerals Tabs tablet Take 1 tablet by mouth daily. Start taking on: June 28, 2019   thiamine 100 MG tablet Take 1 tablet (100 mg total) by mouth daily. Start taking on: June 28, 2019            Durable Medical Equipment  (From admission, onward)         Start     Ordered   06/21/19 [phone]  For home use only DME standard manual wheelchair with seat cushion  Once    Comments: Patient suffers from debility which impairs their ability to perform daily activities in the home.  A walking aid will not resolve issue with performing activities of daily living. A wheelchair will allow  patient to safely perform daily activities. Patient can safely propel the wheelchair in the home or has a caregiver who can provide assistance. Length of need indefinite. Accessories: elevating leg rests (ELRs), wheel locks, extensions and anti-tippers.   06/21/19 [id number]   06/21/19 0836  For home use only DME 3 n 1  Once     06/21/19 0836   06/21/19 0836  For home use only DME Walker rolling  Once    Question:  Patient needs a walker to treat with the following condition  Answer:  Debility   06/21/19 0836          No Known Allergies  Consultations:  Nephrology    Procedures/Studies: Dg Abd 1 View  Result Date: 06/12/2019 CLINICAL DATA:  Tube placement. EXAM: ABDOMEN - 1 VIEW COMPARISON:  None. FINDINGS: An enteric tube is present in the left upper quadrant in the stomach. Paucity of gas throughout the abdomen with nonspecific bowel gas pattern, no clear signs of obstruction. Spinal degenerative changes without acute bone finding. IMPRESSION: An enteric tube is present in the left upper quadrant in  the stomach. Paucity of gas throughout the abdomen. Electronically Signed   By: Zetta Bills M.D.   On: 06/12/2019 15:10   US Renal  Result Date: 06/10/2019 CLINICAL DATA:  Renal failure EXAM: RENAL / URINARY TRACT ULTRASOUND COMPLETE COMPARISON:  None at FINDINGS: Right Kidney: Renal measurements: 13.2 x 7.4 x 6.4 cm = volume: 325 mL. Normal cortical thickness. Increased cortical echogenicity. No mass, hydronephrosis, or shadowing calcification. Left Kidney: Renal measurements: 14.4 x 5.9 x 5.6 cm = volume: 248 mL. Normal cortical thickness. Increased cortical echogenicity. Exophytic nodule from lateral upper to mid LEFT kidney 2.2 x 1.7 x 1.8 cm, containing low level internal echoes, question solid versus complex cystic. No additional mass, hydronephrosis or shadowing calcification. Bladder: Appears normal for degree of bladder distention. Other: N/A IMPRESSION: Medical renal disease changes of  both kidneys. Exophytic indeterminate nodule lateral aspect of upper to mid LEFT kidney 2.2 cm greatest size; this could represent a solid or a complex cystic lesion and further characterization by MR imaging is recommended. Electronically Signed   By: Lavonia Dana M.D.   On: 06/10/2019 23:43   Dg Chest Port 1 View  Result Date: 06/12/2019 CLINICAL DATA:  Central line, gastric tube, intubation EXAM: PORTABLE CHEST 1 VIEW COMPARISON:  06/12/2019, 01:13 p.m. FINDINGS: Interval placement of endotracheal tube, tip below the thoracic inlet over the mid trachea. Esophagogastric tube with tip and side port below the diaphragm. Right neck vascular catheter, tip projecting over the right atrium. Unchanged cardiomegaly. No acute abnormality of the lungs. IMPRESSION: 1. Interval placement of endotracheal tube, tip below the thoracic inlet over the mid trachea. 2.  Esophagogastric tube with tip and side port below the diaphragm. 3. Right neck vascular catheter, tip projecting over the right atrium. 4.  Unchanged cardiomegaly. No acute abnormality of the lungs. Electronically Signed   By: Eddie Candle M.D.   On: 06/12/2019 15:10   Dg Chest Port 1 View  Result Date: 06/12/2019 CLINICAL DATA:  Pulmonary disease. EXAM: PORTABLE CHEST 1 VIEW COMPARISON:  09/15/2017. FINDINGS: Mediastinum hilar structures are normal. Cardiomegaly. No pulmonary venous congestion. Low lung volumes with mild right base atelectasis. No pleural effusion or pneumothorax. No acute bony abnormality IMPRESSION: Stable cardiomegaly. No pulmonary venous congestion. Low lung volumes with mild right base atelectasis. Electronically Signed   By: Marcello Moores  Register   On: 06/12/2019 13:27   US Abdomen Limited Ruq  Result Date: 06/10/2019 CLINICAL DATA:  Abdominal pain.  Elevated lipase. EXAM: ULTRASOUND ABDOMEN LIMITED RIGHT UPPER QUADRANT COMPARISON:  None. FINDINGS: Gallbladder: No gallstones or wall thickening visualized. No sonographic Murphy sign noted  by sonographer. Common bile duct: Diameter: 5 mm, within normal limits. Liver: No focal lesion identified. Within normal limits in parenchymal echogenicity. Portal vein is patent on color Doppler imaging with normal direction of blood flow towards the liver. Other: Increased echogenicity of right renal parenchyma noted, consistent with medical renal disease. IMPRESSION: No evidence of gallstones or other hepatobiliary abnormality. Increased parenchymal echogenicity of right kidney, consistent with medical renal disease. Electronically Signed   By: Marlaine Hind M.D.   On: 06/10/2019 21:45      CRRT   Subjective: Patient seen, sleeping comfortably but easily awoke.  No acute events reported overnight.  Patient denies fever, chills, abdominal pain, SOB, N/V/D, chest pain or other acute complaints.   Discharge Exam: Vitals:   06/27/19 0421 06/27/19 0827  BP: (!) 172/91 (!) 166/90  Pulse: 79 73  Resp: 18 19  Temp: 98.3 F (  36.8 C) 98.3 F (36.8 C)  SpO2: 100% 99%   Vitals:   06/26/19 1627 06/26/19 1919 06/27/19 0421 06/27/19 0827  BP: (!) 142/81 129/75 (!) 172/91 (!) 166/90  Pulse: 94 77 79 73  Resp: _0 Temp: 97.9 F (36.6 C) 97.9 F (36.6 C) 98.3 F (36.8 C) 98.3 F (36.8 C)  TempSrc:  Oral Oral   SpO2: 98% 99% 100% 99%  Weight:      Height:        General: Pt is alert, awake, not in acute distress Cardiovascular: RRR, S1/S2 +, no rubs, no gallops Respiratory: CTA bilaterally, no wheezing, no rhonchi Abdominal: Soft, NT, ND, bowel sounds + Extremities: no edema, no cyanosis    The results of significant diagnostics from this hospitalization (including imaging, microbiology, ancillary and laboratory) are listed below for reference.     Microbiology: Recent Results (from the past 240 hour(s))  SARS CORONAVIRUS 2 (TAT 6-24 HRS) Nasopharyngeal Nasopharyngeal Swab     Status: None   Collection Time: 06/26/19  6:08 PM   Specimen: Nasopharyngeal Swab  Result Value  Ref Range Status   SARS Coronavirus 2 NEGATIVE NEGATIVE Final    Comment: (NOTE) SARS-CoV-2 target nucleic acids are NOT DETECTED. The SARS-CoV-2 RNA is generally detectable in upper and lower respiratory specimens during the acute phase of infection. Negative results do not preclude SARS-CoV-2 infection, do not rule out co-infections with other pathogens, and should not be used as the sole basis for treatment or other patient management decisions. Negative results must be combined with clinical observations, patient history, and epidemiological information. The expected result is Negative. Fact Sheet for Patients: SugarRoll.be Fact Sheet for Healthcare Providers: https://www.woods-mathews.com/ This test is not yet approved or cleared by the [place] FDA and  has been authorized for detection and/or diagnosis of SARS-CoV-2 by FDA under an Emergency Use Authorization (EUA). This EUA will remain  in effect (meaning this test can be used) for the duration of the COVID-19 declaration under Section 56 4(b)(1) of the Act, 21 U.S.C. section 360bbb-3(b)(1), unless the authorization is terminated or revoked sooner. Performed at [facility], [place] [address] [place], [place] [address]      Labs: BNP (last 3 results) No results for input(s): BNP in the last 8760 hours. Basic Metabolic Panel: Recent Labs  Lab 06/22/19 0704 06/23/19 0603 06/24/19 0541 06/25/19 0448 06/26/19 0615 06/27/19 0348  NA 142 141 140 143 141 142  K 3.9 3.7 4.0 3.8 3.5 3.5  CL 109 108 106 106 105 102  CO2 _1 GLUCOSE 108* 111* 101* 110* 133* 117*  BUN 33* 31* 33* 31* 31* 31*  CREATININE 2.33* 2.14* 2.15* 1.92* 1.87* 1.73*  CALCIUM 9.2 9.8 10.2 10.3 10.3 10.5*  MG 1.8 2.1 1.7 1.9 1.9 1.7  PHOS 5.0*  --  6.0* 7.3* 5.7* 5.7*   Liver Function Tests: Recent Labs  Lab 06/22/19 0704 06/24/19 0541 06/25/19 0448 06/26/19 0615 06/27/19 0348   ALBUMIN 3.1* 3.3* 3.2* 3.2* 3.1*   No results for input(s): LIPASE, AMYLASE in the last 168 hours. No results for input(s): AMMONIA in the last 168 hours. CBC: Recent Labs  Lab 06/22/19 0704 06/25/19 0448  WBC 5.9 7.8  HGB 10.5* 10.6*  HCT 31.4* 33.0*  MCV 84.9 88.7  PLT 288 279   Cardiac Enzymes: Recent Labs  Lab 06/21/19 1805  CKTOTAL 200   BNP: Invalid input(s): POCBNP CBG: Recent Labs  Lab 06/22/19 0825 06/24/19 0808 06/24/19 2221  GLUCAP 88 108* 157*   D-Dimer No results for input(s): DDIMER in the last 72 hours. Hgb A1c No results for input(s): HGBA1C in the last 72 hours. Lipid Profile No results for input(s): CHOL, HDL, LDLCALC, TRIG, CHOLHDL, LDLDIRECT in the last 72 hours. Thyroid function studies No results for input(s): TSH, T4TOTAL, T3FREE, THYROIDAB in the last 72 hours.  Invalid input(s): FREET3 Anemia work up No results for input(s): VITAMINB12, FOLATE, FERRITIN, TIBC, IRON, RETICCTPCT in the last 72 hours. Urinalysis    Component Value Date/Time   COLORURINE YELLOW (A) 06/11/2019 0316   APPEARANCEUR CLOUDY (A) 06/11/2019 0316   LABSPEC 1.012 06/11/2019 0316   PHURINE 5.0 06/11/2019 0316   GLUCOSEU NEGATIVE 06/11/2019 0316   HGBUR LARGE (A) 06/11/2019 0316   BILIRUBINUR NEGATIVE 06/11/2019 0316   KETONESUR NEGATIVE 06/11/2019 0316   PROTEINUR 100 (A) 06/11/2019 0316   NITRITE NEGATIVE 06/11/2019 0316   LEUKOCYTESUR SMALL (A) 06/11/2019 0316   Sepsis Labs Invalid input(s): PROCALCITONIN,  WBC,  LACTICIDVEN Microbiology Recent Results (from the past 240 hour(s))  SARS CORONAVIRUS 2 (TAT 6-24 HRS) Nasopharyngeal Nasopharyngeal Swab     Status: None   Collection Time: 06/26/19  6:08 PM   Specimen: Nasopharyngeal Swab  Result Value Ref Range Status   SARS Coronavirus 2 NEGATIVE NEGATIVE Final    Comment: (NOTE) SARS-CoV-2 target nucleic acids are NOT DETECTED. The SARS-CoV-2 RNA is generally detectable in upper and lower respiratory  specimens during the acute phase of infection. Negative results do not preclude SARS-CoV-2 infection, do not rule out co-infections with other pathogens, and should not be used as the sole basis for treatment or other patient management decisions. Negative results must be combined with clinical observations, patient history, and epidemiological information. The expected result is Negative. Fact Sheet for Patients: SugarRoll.be Fact Sheet for Healthcare Providers: https://www.woods-mathews.com/ This test is not yet approved or cleared by the [place] FDA and  has been authorized for detection and/or diagnosis of SARS-CoV-2 by FDA under an Emergency Use Authorization (EUA). This EUA will remain  in effect (meaning this test can be used) for the duration of the COVID-19 declaration under Section 56 4(b)(1) of the Act, 21 U.S.C. section 360bbb-3(b)(1), unless the authorization is terminated or revoked sooner. Performed at [facility], [place] [address], [place], [place] [address]      Time coordinating discharge: Over 30 minutes  SIGNED:   Ezekiel Slocumb, DO [facility] 06/27/2019, 10:56 AM Pager [phone]  If 7PM-7AM, please contact night-coverage [email or web address] Password [email or web address]

## 2019-06-27 NOTE — Plan of Care (Signed)
  Problem: Education: Goal: Knowledge of General Education information will improve Description: Including pain rating scale, medication(s)/side effects and non-pharmacologic comfort measures Outcome: Adequate for Discharge   Problem: Health Behavior/Discharge Planning: Goal: Ability to manage health-related needs will improve Outcome: Adequate for Discharge   Problem: Clinical Measurements: Goal: Ability to maintain clinical measurements within normal limits will improve Outcome: Adequate for Discharge Goal: Diagnostic test results will improve Outcome: Adequate for Discharge   Problem: Clinical Measurements: Goal: Diagnostic test results will improve Outcome: Adequate for Discharge   Problem: Elimination: Goal: Will not experience complications related to bowel motility Outcome: Adequate for Discharge Goal: Will not experience complications related to urinary retention Outcome: Adequate for Discharge   Problem: Acute Rehab PT Goals(only PT should resolve) Goal: Pt Will Go Supine/Side To Sit Outcome: Adequate for Discharge Goal: Patient Will Transfer Sit To/From Stand Outcome: Adequate for Discharge Goal: Pt Will Transfer Bed To Chair/Chair To Bed Outcome: Adequate for Discharge Goal: Pt Will Ambulate Outcome: Adequate for Discharge Goal: Pt Will Go Up/Down Stairs Outcome: Adequate for Discharge Goal: Pt/caregiver will Perform Home Exercise Program Outcome: Adequate for Discharge   Problem: Safety: Goal: Ability to remain free from injury will improve Outcome: Adequate for Discharge   Problem: Inadequate Intake (NI-2.1) Goal: Food and/or nutrient delivery Description: Individualized approach for food/nutrient provision. Outcome: Adequate for Discharge

## 2019-06-27 NOTE — Progress Notes (Signed)
Report called to [facility], given to [employer], EMS called, states patient is 7th in line at this time.

## 2019-06-27 NOTE — Progress Notes (Signed)
[facility]  ROUNDING NOTE   Subjective:   Length of stay: 17 days 11/17 0701 - 11/18 0700 In: 240 [P.O.:240] Out: [id number] Denies any acute complaints Serum creatinine slightly improved Nursing staff reports generalized weakness. Got up yesterday to sit in chair   Objective:  Vital signs in last 24 hours:  Temp:  [97.9 F (36.6 C)-98.3 F (36.8 C)] 98.3 F (36.8 C) (11/18 0827) Pulse Rate:  [73-94] 73 (11/18 0827) Resp:  [16-19] 19 (11/18 0827) BP: (129-172)/(75-91) 166/90 (11/18 0827) SpO2:  [98 %-100 %] 99 % (11/18 0827)  Weight change:  Filed Weights   06/24/19 0339 06/25/19 0414 06/26/19 0446  Weight: 87.1 kg 81.6 kg 81.6 kg    Intake/Output: I/O last 3 completed shifts: In: 240 [P.O.:240] Out: 1900 [Urine:1900]   Intake/Output this shift:  No intake/output data recorded.  Physical Exam: General:  laying in bed   Head: Normocephalic atraumatic  Eyes: Anicteric  Neck: Supple,   Lungs:  Clear to auscultation   Heart: regular  Abdomen:  Soft, nontender, bowel sounds present  Extremities: No peripheral edema.  Neurologic:  Lethargic, answers few questions, mumbles  Skin: No lesions        Basic Metabolic Panel: Recent Labs  Lab 06/22/19 0704 06/23/19 0603 06/24/19 0541 06/25/19 0448 06/26/19 0615 06/27/19 0348  NA 142 141 140 143 141 142  K 3.9 3.7 4.0 3.8 3.5 3.5  CL 109 108 106 106 105 102  CO2 24 25 24 25 29 29   GLUCOSE 108* 111* 101* 110* 133* 117*  BUN 33* 31* 33* 31* 31* 31*  CREATININE 2.33* 2.14* 2.15* 1.92* 1.87* 1.73*  CALCIUM 9.2 9.8 10.2 10.3 10.3 10.5*  MG 1.8 2.1 1.7 1.9 1.9 1.7  PHOS 5.0*  --  6.0* 7.3* 5.7* 5.7*    Liver Function Tests: Recent Labs  Lab 06/22/19 0704 06/24/19 0541 06/25/19 0448 06/26/19 0615 06/27/19 0348  ALBUMIN 3.1* 3.3* 3.2* 3.2* 3.1*   No results for input(s): LIPASE, AMYLASE in the last 168 hours. No results for input(s): AMMONIA in the last 168 hours.  CBC: Recent  Labs  Lab 06/22/19 0704 06/25/19 0448  WBC 5.9 7.8  HGB 10.5* 10.6*  HCT 31.4* 33.0*  MCV 84.9 88.7  PLT 288 279    Cardiac Enzymes: Recent Labs  Lab 06/21/19 1805  CKTOTAL 200    BNP: Invalid input(s): POCBNP  CBG: Recent Labs  Lab 06/22/19 0825 06/24/19 0808 06/24/19 2221  GLUCAP 88 108* 157*    Microbiology: Results for orders placed or performed during the hospital encounter of 06/10/19  SARS Coronavirus 2 by RT PCR (hospital order, performed in [place] hospital lab)     Status: None   Collection Time: 06/10/19 10:02 PM  Result Value Ref Range Status   SARS Coronavirus 2 NEGATIVE NEGATIVE Final    Comment: (NOTE) If result is NEGATIVE SARS-CoV-2 target nucleic acids are NOT DETECTED. The SARS-CoV-2 RNA is generally detectable in upper and lower  respiratory specimens during the acute phase of infection. The lowest  concentration of SARS-CoV-2 viral copies this assay can detect is 250  copies / mL. A negative result does not preclude SARS-CoV-2 infection  and should not be used as the sole basis for treatment or other  patient management decisions.  A negative result may occur with  improper specimen collection / handling, submission of specimen other  than nasopharyngeal swab, presence of viral mutation(s) within the  areas targeted by this assay, and  inadequate number of viral copies  (<250 copies / mL). A negative result must be combined with clinical  observations, patient history, and epidemiological information. If result is POSITIVE SARS-CoV-2 target nucleic acids are DETECTED. The SARS-CoV-2 RNA is generally detectable in upper and lower  respiratory specimens dur ing the acute phase of infection.  Positive  results are indicative of active infection with SARS-CoV-2.  Clinical  correlation with patient history and other diagnostic information is  necessary to determine patient infection status.  Positive results do  not rule out bacterial  infection or co-infection with other viruses. If result is PRESUMPTIVE POSTIVE SARS-CoV-2 nucleic acids MAY BE PRESENT.   A presumptive positive result was obtained on the submitted specimen  and confirmed on repeat testing.  While 2019 novel coronavirus  (SARS-CoV-2) nucleic acids may be present in the submitted sample  additional confirmatory testing may be necessary for epidemiological  and / or clinical management purposes  to differentiate between  SARS-CoV-2 and other Sarbecovirus currently known to infect humans.  If clinically indicated additional testing with an alternate test  methodology [phone]) is advised. The SARS-CoV-2 RNA is generally  detectable in upper and lower respiratory sp ecimens during the acute  phase of infection. The expected result is Negative. Fact Sheet for Patients:  StrictlyIdeas.no Fact Sheet for Healthcare Providers: BankingDealers.co.za This test is not yet approved or cleared by the [place] FDA and has been authorized for detection and/or diagnosis of SARS-CoV-2 by FDA under an Emergency Use Authorization (EUA).  This EUA will remain in effect (meaning this test can be used) for the duration of the COVID-19 declaration under Section 564(b)(1) of the Act, 21 U.S.C. section 360bbb-3(b)(1), unless the authorization is terminated or revoked sooner. Performed at [facility], [place] [address], [place], [place] [address]   MRSA PCR Screening     Status: None   Collection Time: 06/11/19 12:30 PM   Specimen: Nasopharyngeal  Result Value Ref Range Status   MRSA by PCR NEGATIVE NEGATIVE Final    Comment:        The GeneXpert MRSA Assay (FDA approved for NASAL specimens only), is one component of a comprehensive MRSA colonization surveillance program. It is not intended to diagnose MRSA infection nor to guide or monitor treatment for MRSA infections. Performed at [facility], [address] [place], [place] [address]   CULTURE, BLOOD (ROUTINE X 2) w Reflex to ID Panel     Status: None   Collection Time: 06/12/19  1:34 PM   Specimen: BLOOD  Result Value Ref Range Status   Specimen Description BLOOD BLOOD RIGHT HAND  Final   Special Requests   Final    BOTTLES DRAWN AEROBIC AND ANAEROBIC Blood Culture adequate volume   Culture   Final    NO GROWTH 5 DAYS Performed at [facility], [address] [place], [place] [address]    Report Status 06/17/2019 FINAL  Final  CULTURE, BLOOD (ROUTINE X 2) w Reflex to ID Panel     Status: None   Collection Time: 06/12/19  1:35 PM   Specimen: BLOOD  Result Value Ref Range Status   Specimen Description BLOOD BLOOD LEFT FOREARM  Final   Special Requests   Final    BOTTLES DRAWN AEROBIC AND ANAEROBIC Blood Culture adequate volume   Culture   Final    NO GROWTH 5 DAYS Performed at [facility], [address] [place], [place] [address]    Report  Status 06/17/2019 FINAL  Final  Culture, respiratory (non-expectorated)     Status: None   Collection Time: 06/13/19 12:10 AM   Specimen: Tracheal Aspirate; Respiratory  Result Value Ref Range Status   Specimen Description   Final    TRACHEAL ASPIRATE Performed at [facility], [address] [place], [place] [address]    Special Requests   Final    NONE Performed at [facility], [place], [place] [address]    Gram Stain   Final    RARE WBC PRESENT, PREDOMINANTLY PMN MODERATE GRAM POSITIVE COCCI IN PAIRS IN CLUSTERS FEW GRAM NEGATIVE RODS    Culture   Final    FEW Consistent with normal respiratory flora. Performed at [facility], [place] [address] [place], [place] [address]    Report Status 06/15/2019 FINAL  Final  SARS CORONAVIRUS 2 (TAT 6-24 HRS) Nasopharyngeal Nasopharyngeal Swab     Status: None   Collection Time: 06/26/19  6:08 PM   Specimen: Nasopharyngeal Swab  Result Value Ref Range Status   SARS  Coronavirus 2 NEGATIVE NEGATIVE Final    Comment: (NOTE) SARS-CoV-2 target nucleic acids are NOT DETECTED. The SARS-CoV-2 RNA is generally detectable in upper and lower respiratory specimens during the acute phase of infection. Negative results do not preclude SARS-CoV-2 infection, do not rule out co-infections with other pathogens, and should not be used as the sole basis for treatment or other patient management decisions. Negative results must be combined with clinical observations, patient history, and epidemiological information. The expected result is Negative. Fact Sheet for Patients: SugarRoll.be Fact Sheet for Healthcare Providers: https://www.woods-mathews.com/ This test is not yet approved or cleared by the [place] FDA and  has been authorized for detection and/or diagnosis of SARS-CoV-2 by FDA under an Emergency Use Authorization (EUA). This EUA will remain  in effect (meaning this test can be used) for the duration of the COVID-19 declaration under Section 56 4(b)(1) of the Act, 21 U.S.C. section 360bbb-3(b)(1), unless the authorization is terminated or revoked sooner. Performed at [facility], [place] [address] [place],  [address]     Coagulation Studies: No results for input(s): LABPROT, INR in the last 72 hours.  Urinalysis: No results for input(s): COLORURINE, LABSPEC, PHURINE, GLUCOSEU, HGBUR, BILIRUBINUR, KETONESUR, PROTEINUR, UROBILINOGEN, NITRITE, LEUKOCYTESUR in the last 72 hours.  Invalid input(s): APPERANCEUR    Imaging: No results found.   Medications:    . amLODipine  10 mg Oral Daily  . cloNIDine  0.2 mg Transdermal Weekly  . feeding supplement (ENSURE ENLIVE)  237 mL Oral BID BM  . folic acid  1 mg Oral Daily  . furosemide  40 mg Intravenous Daily  . heparin  5,000 Units Subcutaneous Q8H  . hydrALAZINE  50 mg Oral Q8H  . influenza vac split quadrivalent PF  0.5 mL Intramuscular  Tomorrow-1000  . megestrol  400 mg Oral BID  . metoprolol tartrate  25 mg Oral BID  . multivitamin with minerals  1 tablet Oral Daily  . sodium chloride flush  3 mL Intravenous Q12H  . thiamine  100 mg Oral Daily  . vitamin C  250 mg Oral BID   ALPRAZolam, hydrALAZINE, ipratropium-albuterol, labetalol, ondansetron **OR** ondansetron (ZOFRAN) IV  Assessment/ Plan:  58 y.o.black male with alcohol abuse and educational disability who was admitted to [facility] on 06/10/2019 for evaluation of weakness, diarrhea, nausea, and vomiting.    1.  Acute kidney injury with metabolic acidosis suspected  secondary to prolonged volume depletion (prerenal azotemia) and rhabdomyolysis.  admiit Cr 11.5, BUN 188 on 06/10/2019 Baseline creatinine of 0.78, normal GFR on 01/2018 Patient required continuous renal placement therapy - dialysis cathter removed - nonoliguric urine output.  - Encourage PO intake -Serum creatinine is improving slowly, will monitor closely -Currently also getting IV furosemide -consider Changing to oral torsemide 20 mg daily as needed for leg edema  2.  Anemia with renal failure:   No indication for ESA.  Lab Results  Component Value Date   HGB 10.6 (L) 06/25/2019       LOS: 17 Martin Diaz 11/18/20209:56 AM

## 2019-08-13 ENCOUNTER — Encounter: Payer: Self-pay | Admitting: Emergency Medicine

## 2019-08-13 ENCOUNTER — Emergency Department
Admission: EM | Admit: 2019-08-13 | Discharge: 2019-08-13 | Disposition: A | Discharge status: Home / Self Care | Payer: Medicare Other | Attending: Emergency Medicine | Admitting: Emergency Medicine

## 2019-08-13 ENCOUNTER — Other Ambulatory Visit: Payer: Self-pay

## 2019-08-13 DIAGNOSIS — R300 Dysuria: Secondary | ICD-10-CM

## 2019-08-13 DIAGNOSIS — Z79899 Other long term (current) drug therapy: Secondary | ICD-10-CM | POA: Diagnosis not present

## 2019-08-13 DIAGNOSIS — F79 Unspecified intellectual disabilities: Secondary | ICD-10-CM | POA: Insufficient documentation

## 2019-08-13 DIAGNOSIS — R339 Retention of urine, unspecified: Secondary | ICD-10-CM | POA: Diagnosis present

## 2019-08-13 LAB — URINALYSIS, COMPLETE (UACMP) WITH MICROSCOPIC
Bacteria, UA: NONE SEEN
Bilirubin Urine: NEGATIVE
Glucose, UA: NEGATIVE mg/dL
Hgb urine dipstick: NEGATIVE
Ketones, ur: NEGATIVE mg/dL
Leukocytes,Ua: NEGATIVE
Nitrite: NEGATIVE
Protein, ur: 30 mg/dL — AB
Specific Gravity, Urine: 1.023 (ref 1.005–1.030)
pH: 5 (ref 5.0–8.0)

## 2019-08-13 NOTE — ED Triage Notes (Signed)
Pt here via EMS from [facility] with c/o being unable to urinate for the past 24 hours per staff. Pt is poor historian with intellectual disability and communication deficit. NAD. Bladder scan in triage showed: 45cc. Pt states he recently emptied his bladder in lobby bathroom, however, unable to know if this information is accurate. NAD.

## 2019-08-13 NOTE — Discharge Instructions (Addendum)
Follow-up with his primary care provider if any continued problems.  A urologist is also listed on his discharge papers should he need to see a specialist.  Urinalysis in the emergency department was negative for infection.  Return to the emergency department if any severe worsening of his symptoms including fever, chills, nausea, vomiting or blood in the urine.  On today's exam there is no infection or blood seen in his urine and patient was able to urinate multiple times while in the ED.  Encourage patient to drink fluids to stay hydrated.

## 2019-08-13 NOTE — ED Notes (Signed)
Legal guardian unable to pick pt up upon d/c, states her husband has covid in the hospital and she doesn't drive. Will need to call group home to see if they are able to transport pt after d/c. Pt is cognitively unable to ride in a cab.

## 2019-08-13 NOTE — ED Notes (Signed)
See triage note  Presents from group home  States he feels like he is not able to void   Denies any fever

## 2019-08-13 NOTE — ED Provider Notes (Addendum)
[facility] Emergency Department Provider Note   ____________________________________________   First MD Initiated Contact with Patient 08/13/19 1140     (approximate)  I have reviewed the triage vital signs and the nursing notes.   HISTORY  Chief Complaint Unable to urinate Patient is poor historian  HPI Martin Diaz is a 59 y.o. male is brought to the ED with complaint of inability to urinate.  Patient has MR and lives at a group home.  He is a very poor historian and difficult to communicate with.  He was brought to the ED via EMS with this complaint.      Past Medical History:  Diagnosis Date  . Alcohol use     Patient Active Problem List   Diagnosis Date Noted  . Metabolic acidosis   . Non-traumatic rhabdomyolysis   . Acute renal failure (ARF) ([place]) 06/10/2019  . Pancreatitis, alcoholic, acute [id number]  . Alcohol use 06/10/2019  . Hypocalcemia 06/10/2019    History reviewed. No pertinent surgical history.  Prior to Admission medications   Medication Sig Start Date End Date Taking? Authorizing Provider  amLODipine (NORVASC) 10 MG tablet Take 1 tablet (10 mg total) by mouth daily. 06/28/19   Ezekiel Slocumb, DO  cloNIDine (CATAPRES - DOSED IN MG/24 HR) 0.2 mg/24hr patch Place 1 patch (0.2 mg total) onto the skin once a week. 07/01/19   Ezekiel Slocumb, DO  feeding supplement, ENSURE ENLIVE, (ENSURE ENLIVE) LIQD Take 237 mLs by mouth 2 (two) times daily between meals. 06/27/19   Ezekiel Slocumb, DO  folic acid (FOLVITE) 1 MG tablet Take 1 tablet (1 mg total) by mouth daily. 06/28/19   Nicole Kindred A, DO  hydrALAZINE (APRESOLINE) 50 MG tablet Take 1 tablet (50 mg total) by mouth every 8 (eight) hours. 06/27/19   Ezekiel Slocumb, DO  megestrol (MEGACE) 400 MG/10ML suspension Take 10 mLs (400 mg total) by mouth 2 (two) times daily. 06/27/19   Nicole Kindred A, DO  metoprolol tartrate (LOPRESSOR) 25 MG tablet Take 1 tablet (25 mg  total) by mouth 2 (two) times daily. 06/27/19   Ezekiel Slocumb, DO  Multiple Vitamin (MULTIVITAMIN WITH MINERALS) TABS tablet Take 1 tablet by mouth daily. 06/28/19   Nicole Kindred A, DO  thiamine 100 MG tablet Take 1 tablet (100 mg total) by mouth daily. 06/28/19   Ezekiel Slocumb, DO  vitamin C (VITAMIN C) 250 MG tablet Take 1 tablet (250 mg total) by mouth 2 (two) times daily. 06/27/19   Ezekiel Slocumb, DO    Allergies Patient has no known allergies.  No family history on file.  Social History Social History   Tobacco Use  . Smoking status: Never Smoker  . Smokeless tobacco: Never Used  Substance Use Topics  . Alcohol use: Yes    Comment: says he hasn't been drinking  . Drug use: Not Currently    Review of Systems Constitutional: No fever/chills Cardiovascular: Denies chest pain. Respiratory: Denies shortness of breath. Gastrointestinal: No abdominal pain.  No nausea, no vomiting.  No diarrhea.  Genitourinary: Positive for inability to void per EMS. Musculoskeletal: Negative for back pain. Skin: Negative for rash. Neurological: Negative for headaches, focal weakness or numbness. ____________________________________________   PHYSICAL EXAM:  VITAL SIGNS: ED Triage Vitals  Enc Vitals Group     BP 08/13/19 1024 133/74     Pulse Rate 08/13/19 1024 83     Resp 08/13/19 1024 16     Temp  08/13/19 1024 98.2 F (36.8 C)     Temp Source 08/13/19 1024 Axillary     SpO2 08/13/19 1024 100 %     Weight 08/13/19 1025 190 lb (86.2 kg)     Height 08/13/19 1025 6' (1.829 m)     Head Circumference --      Peak Flow --      Pain Score 08/13/19 1025 0     Pain Loc --      Pain Edu? --      Excl. in [place]? --    Constitutional: Alert.  Well appearing and in no acute distress. Eyes: Conjunctivae are normal.  Head: Atraumatic. Neck: No stridor.   Cardiovascular: Normal rate, regular rhythm. Grossly normal heart sounds.  Good peripheral circulation. Respiratory: Normal  respiratory effort.  No retractions. Lungs CTAB. Gastrointestinal: Soft and nontender. No distention.  No tenderness. Musculoskeletal: Moves upper and lower extremities any difficulty and patient is noted to be ambulatory multiple times in the hallway while in the ED. Neurologic:  Normal speech and language. No gross focal neurologic deficits are appreciated. No gait instability. Skin:  Skin is warm, dry and intact. No rash noted. Psychiatric: Mood and affect are normal. Speech and behavior are normal.  ____________________________________________   LABS (all labs ordered are listed, but only abnormal results are displayed)  Labs Reviewed  URINALYSIS, COMPLETE (UACMP) WITH MICROSCOPIC - Abnormal; Notable for the following components:      Result Value   Color, Urine YELLOW (*)    APPearance CLEAR (*)    Protein, ur 30 (*)    All other components within normal limits     PROCEDURES  Procedure(s) performed (including Critical Care):  Procedures   ____________________________________________   INITIAL IMPRESSION / ASSESSMENT AND PLAN / ED COURSE  As part of my medical decision making, I reviewed the following data within the electronic MEDICAL RECORD NUMBER Notes from prior ED visits and [place] Controlled Substance Database  Martin Diaz was evaluated in Emergency Department on 08/13/2019 for the symptoms described in the history of present illness. He was evaluated in the context of the global COVID-19 pandemic, which necessitated consideration that the patient might be at risk for infection with the SARS-CoV-2 virus that causes COVID-19. Institutional protocols and algorithms that pertain to the evaluation of patients at risk for COVID-19 are in a state of rapid change based on information released by regulatory bodies including the CDC and federal and state organizations. These policies and algorithms were followed during the patient's care in the ED.  59 year old male with history of MR  is brought to the ED via EMS from a group home with complaint of inability to void.  Bladder scan shows 45 cc of urine.  Patient prior to bladder scan had been to the lobby bathroom and apparently was able to urinate.  Urinalysis was obtained in the ED and lab reports is negative.  Group home was made aware that he does not have an infection.  He is to follow-up with his PCP if any continued problems or name of urologist was listed for him to follow-up with.  Group home made arrangements for patient to be picked up from the ED.   ____________________________________________   FINAL CLINICAL IMPRESSION(S) / ED DIAGNOSES  Final diagnoses:  Dysuria     ED Discharge Orders    None       Note:  This document was prepared using Dragon voice recognition software and may include unintentional dictation errors.  Johnn Hai, PA-C 08/13/19 1345    Johnn Hai, PA-C 08/13/19 1346    Arta Silence, MD 08/13/19 1453

## 2019-08-13 NOTE — ED Triage Notes (Signed)
Pt in via EMS from [facility] with c/o not being able to urinate. EMS reports per pt he has not been able to urinate for the last 24 hours. POA Ms. Lennox Grumbles. Number located on information sheet.

## 2019-10-31 ENCOUNTER — Other Ambulatory Visit: Payer: Self-pay

## 2019-10-31 ENCOUNTER — Encounter: Payer: Self-pay | Admitting: Urology

## 2019-10-31 ENCOUNTER — Ambulatory Visit (INDEPENDENT_AMBULATORY_CARE_PROVIDER_SITE_OTHER): Payer: Medicare Other | Admitting: Urology

## 2019-10-31 VITALS — BP 156/77 | HR 80 | Wt 199.3 lb

## 2019-10-31 DIAGNOSIS — N281 Cyst of kidney, acquired: Secondary | ICD-10-CM | POA: Diagnosis not present

## 2019-10-31 DIAGNOSIS — N170 Acute kidney failure with tubular necrosis: Secondary | ICD-10-CM | POA: Diagnosis not present

## 2019-10-31 DIAGNOSIS — N3281 Overactive bladder: Secondary | ICD-10-CM

## 2019-10-31 LAB — BLADDER SCAN AMB NON-IMAGING

## 2019-10-31 NOTE — Progress Notes (Signed)
   10/31/19 1:29 PM   Martin Diaz April 25, 1961 [id number]  CC: Left renal cyst, urinary symptoms  HPI: I saw Martin Diaz in urology clinic for evaluation of a left renal cyst seen on ultrasound as well as urinary symptoms of urgency and frequency.  Most of the history is obtained from his caretaker today.  He is a 59 year old male with developmental delay he lives in a group home with a guardian who was hospitalized in November 2020 with altered mental status and acute renal failure likely secondary to rhabdomyolysis requiring dialysis.  Renal ultrasound during this hospitalization showed no hydronephrosis, however there was a complex 2.2 cm left renal cyst, an MRI was recommended for further evaluation.  This has not been performed.  He clinically improved and creatinine was 1.7 at discharge.  He also reports some urgency and frequency of urination over the last few months that has been improving over the last few weeks.  He denies any incontinence or significant nocturia.  He has no weak stream or gross hematuria.  His caretaker thinks he urinates 4-6 times per day.  He drinks water, juices, and soda during the day.  He had a recent ER visit for feeling of incomplete bladder emptying, however PVR at that visit was normal at 45 mL.  PVR is normal today at 57 mL.  Urinalysis during his hospitalization was benign with 0-5 RBCs, 0-5 WBCs, no bacteria, nitrite negative.  He denies any smoking history or other carcinogenic exposures.  Surgical History: No past surgical history on file.   Family History: No family history on file.  Social History:  reports that he has never smoked. He has never used smokeless tobacco. He reports current alcohol use. He reports previous drug use.  Physical Exam: BP (!) 156/77 (BP Location: Left Arm, Patient Position: Sitting, Cuff Size: Normal)   Pulse 80   Wt 199 lb 4.8 oz (90.4 kg)   BMI 27.03 kg/m     Pertinent Imaging: I have personally reviewed the  renal ultrasound, 2.2 cm left exophytic complex cyst versus solid mass  Assessment & Plan:   In summary, he is a 59 year old male with developmental delay who was hospitalized in November 2020 with acute renal failure requiring dialysis likely secondary to rhabdomyolysis.  There is no hydronephrosis on renal ultrasound at that time, however there was an incidental finding of a 2.2 cm left exophytic complex cyst, an MRI was recommended for further evaluation.  We discussed possible etiologies ranging from a simple renal cyst that does not require further work-up or evaluation, to more concerning findings like kidney cancer.  Regarding his urinary symptoms, he has some mild urgency and frequency during the day that is likely related to his fluid intake.  With his co-morbidities he is not a good candidate for PSA screening.  -MRI abdomen ordered for evaluation of complex left renal cyst, will call with results -Behavioral strategies discussed at length regarding urinary urgency and frequency  Martin Madrid, MD 10/31/2019  [facility] [address] [place], [place] [address] [phone]

## 2019-10-31 NOTE — Patient Instructions (Signed)

## 2019-11-28 ENCOUNTER — Other Ambulatory Visit: Payer: Self-pay

## 2019-11-28 ENCOUNTER — Ambulatory Visit
Admission: RE | Admit: 2019-11-28 | Discharge: 2019-11-28 | Disposition: A | Discharge status: Home / Self Care | Payer: Medicare Other | Source: Ambulatory Visit | Attending: Urology | Admitting: Urology

## 2019-11-28 DIAGNOSIS — N281 Cyst of kidney, acquired: Secondary | ICD-10-CM | POA: Diagnosis present

## 2019-11-28 IMAGING — MR MR ABDOMEN WO/W CM
18 series · 48 of 48 positions shown · IV contrast (9ml Gadavist)
Comparison: Renal ultrasound [DATE]

CLINICAL DATA: Complex left renal cyst for further characterization

EXAM:
MRI ABDOMEN WITHOUT AND WITH CONTRAST
TECHNIQUE: Multiplanar multisequence MR imaging of the abdomen was performed
both before and after the administration of intravenous contrast.
CONTRAST:  9mL GADAVIST GADOBUTROL 1 MMOL/ML IV SOLN

[Series 2: cor haste · coronal · 6.0mm · 1.19mm/px · 2 of 32 slices shown]
[im 1/32]
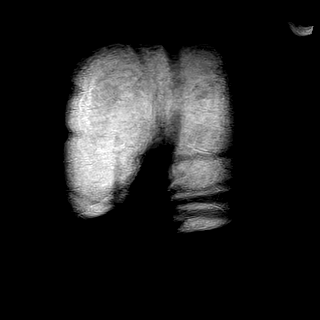
[im 32/32]
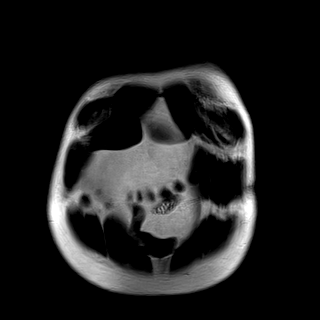

[Series 4: T2 fat-sat · axial · 6.0mm · 1.19mm/px · z∈[-104,+119]mm · 2 of 32 slices shown]
[im 1/32]
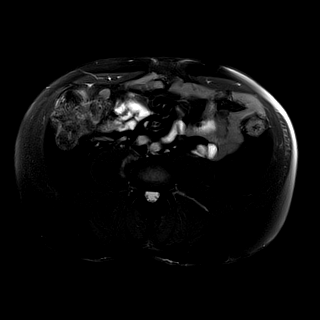
[im 32/32]
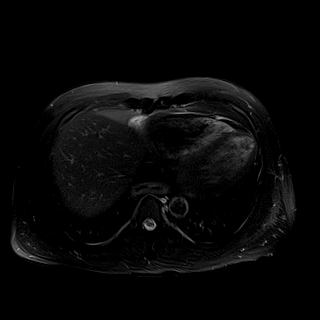

[Series 6: DWI · axial · 6.0mm · 1.42mm/px · z∈[-104,+119]mm · 5 of 96 slices shown (1 of 2)]
[im 1/96]
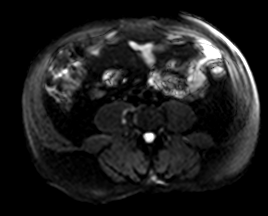
[im 24/96]
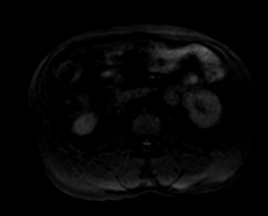
[im 48/96]
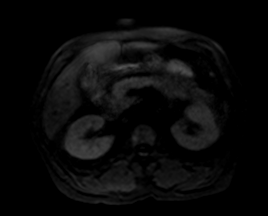
[im 72/96]
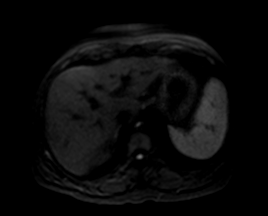
[im 96/96]
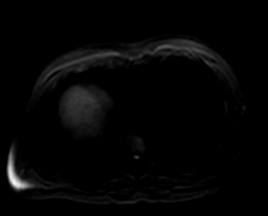

[Series 7: DWI · axial · 6.0mm · 1.42mm/px · 1 of 32 slices shown (2 of 2)]
[im 1/32]
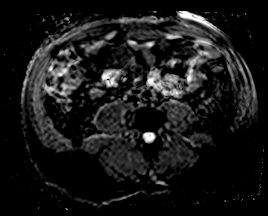

[Series 8: ax in & · axial · 3.0mm · 1.19mm/px · z∈[-99,+114]mm · 3 of 72 slices shown (1 of 2)]
[im 1/72]
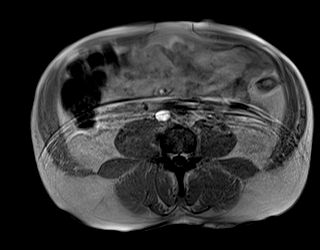
[im 36/72]
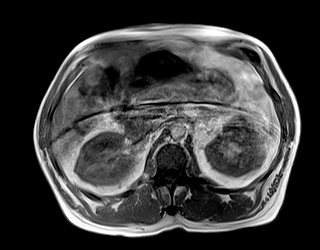
[im 72/72]
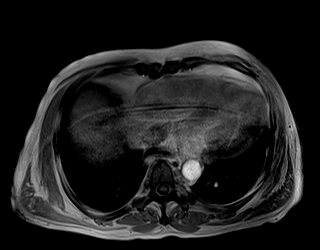

[Series 8: ax in & · axial · 3.0mm · 1.19mm/px · z∈[-99,+114]mm · 3 of 72 slices shown (2 of 2)]
[im 1/72]
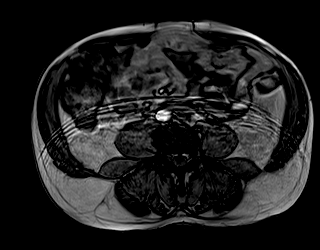
[im 36/72]
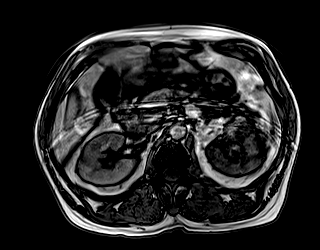
[im 72/72]
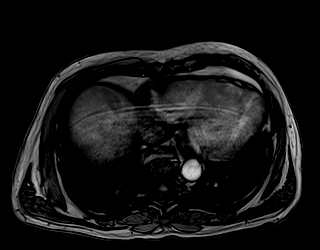

[Series 9: bSSFP · axial · 6.0mm · 0.74mm/px · 1 of 32 slices shown]
[im 1/32]
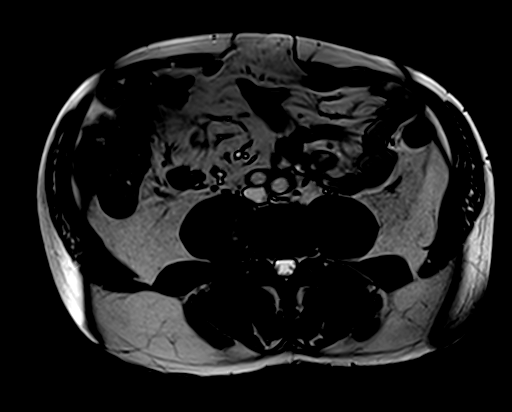

[Series 10: T1 dynamic · axial · non-contrast · 3.0mm · 1.19mm/px · z∈[-104,+109]mm · 3 of 72 slices shown (1 of 9)]
[im 1/72]
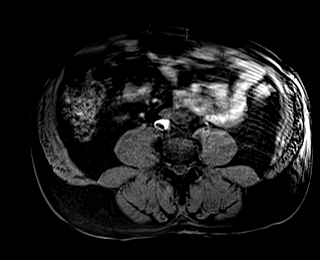
[im 36/72]
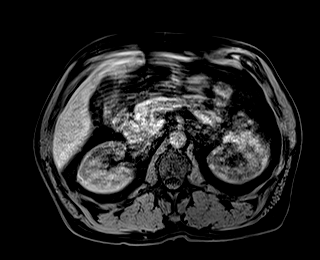
[im 72/72]
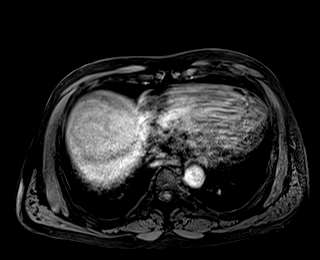

[Series 11: T1 dynamic · axial · 3.0mm · 1.19mm/px · z∈[-104,+109]mm · 3 of 72 slices shown (2 of 9)]
[im 1/72]
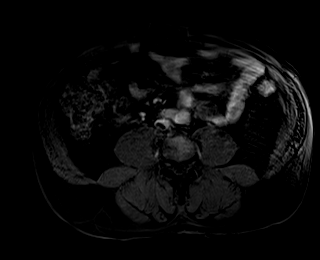
[im 36/72]
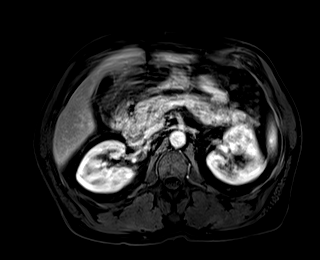
[im 72/72]
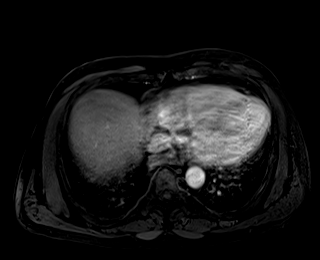

[Series 12: T1 dynamic · axial · 3.0mm · 1.19mm/px · z∈[-104,+109]mm · 3 of 72 slices shown (3 of 9)]
[im 1/72]
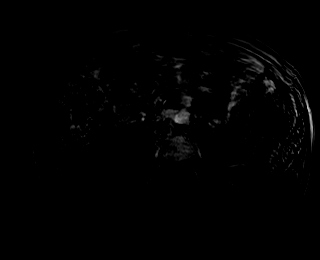
[im 36/72]
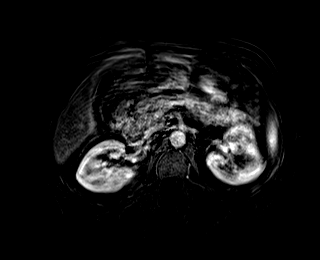
[im 72/72]
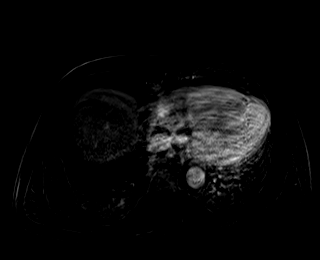

[Series 13: T1 dynamic · axial · 3.0mm · 1.19mm/px · z∈[-104,+109]mm · 3 of 72 slices shown (4 of 9)]
[im 1/72]
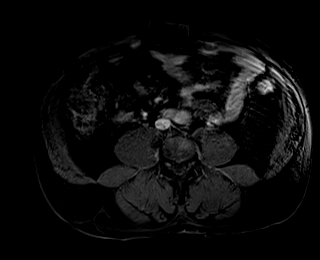
[im 36/72]
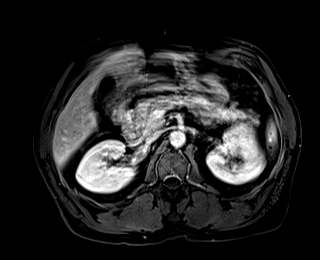
[im 72/72]
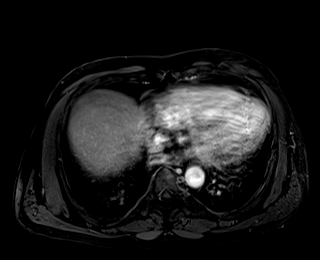

[Series 14: T1 dynamic · axial · 3.0mm · 1.19mm/px · z∈[-104,+109]mm · 3 of 72 slices shown (5 of 9)]
[im 1/72]
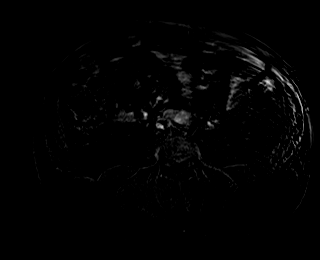
[im 36/72]
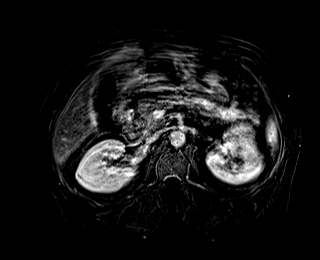
[im 72/72]
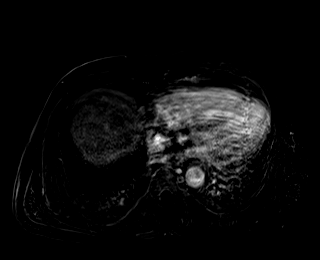

[Series 15: T1 dynamic · axial · 3.0mm · 1.19mm/px · z∈[-104,+109]mm · 3 of 72 slices shown (6 of 9)]
[im 1/72]
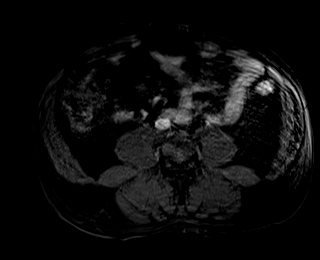
[im 36/72]
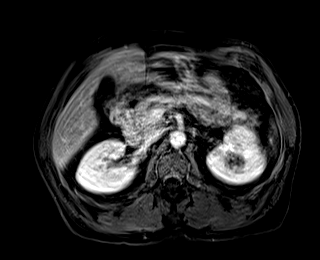
[im 72/72]
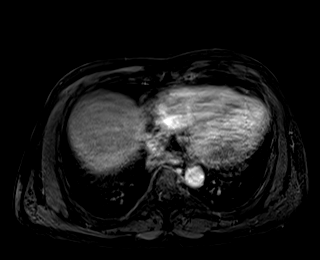

[Series 16: T1 dynamic · axial · 3.0mm · 1.19mm/px · z∈[-104,+109]mm · 3 of 72 slices shown (7 of 9)]
[im 1/72]
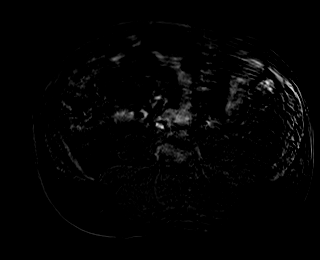
[im 36/72]
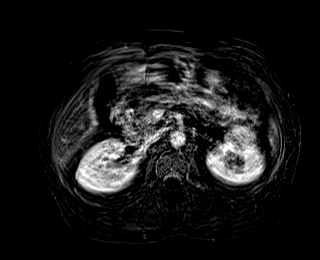
[im 72/72]
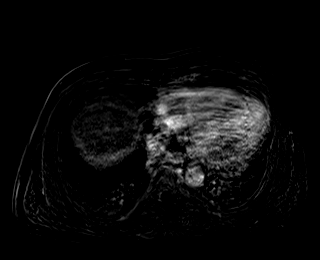

[Series 17: T1 dynamic post-contrast · coronal · 3.0mm · 1.31mm/px · 3 of 80 slices shown]
[im 1/80]
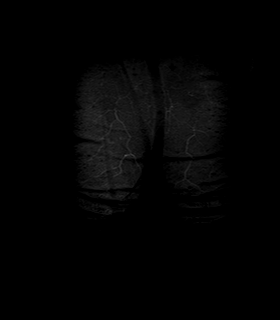
[im 40/80]
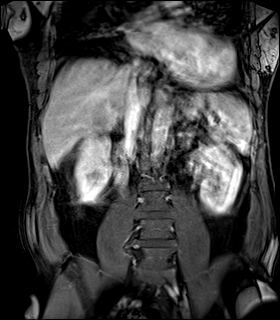
[im 80/80]
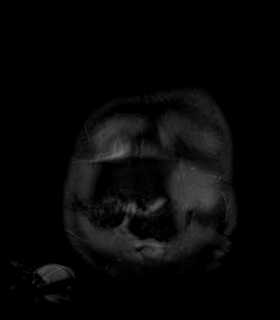

[Series 18: T2 · axial · 6.0mm · 1.19mm/px · 1 of 32 slices shown]
[im 1/32]
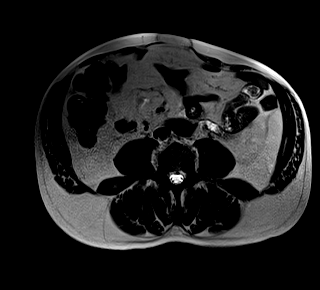

[Series 19: T1 dynamic · axial · 3.0mm · 1.19mm/px · z∈[-104,+109]mm · 3 of 72 slices shown (8 of 9)]
[im 1/72]
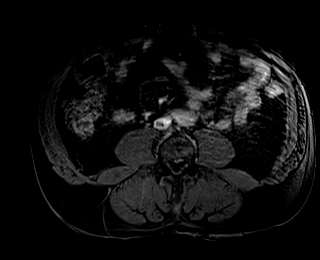
[im 36/72]
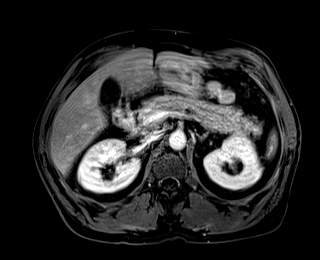
[im 72/72]
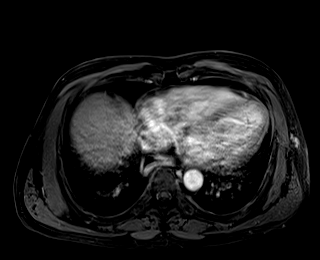

[Series 20: T1 dynamic · axial · 3.0mm · 1.19mm/px · z∈[-104,+109]mm · 3 of 72 slices shown (9 of 9)]
[im 1/72]
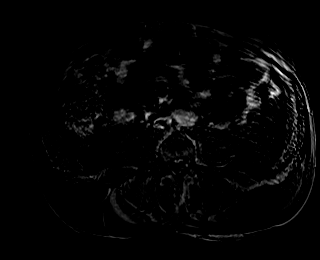
[im 36/72]
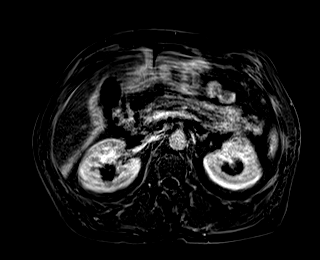
[im 72/72]
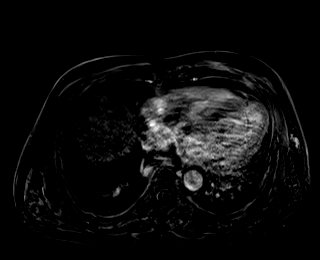

[48 of 48 positions shown; findings below may reference images not displayed]

FINDINGS: Despite efforts by the technologist and patient, motion artifact is
present on today's exam and could not be eliminated. This reduces
exam sensitivity and specificity.

Lower chest: Cardiomegaly noted.

Hepatobiliary: Unremarkable where included

Pancreas: Along the posterior margin of the pancreatic tail, a
by 1.7 by 1.6 cm lesion has generally similar signal characteristics
to the spleen on most sequences, but is obscured on many sequences
including the postcontrast sequences due to motion artifact. A 10 to
favor this representing accessory splenic tissue along the margin of
the pancreatic tail rather than a true pancreatic mass, but because
of the potential significance of a pancreatic mass, further
investigation by pancreatic protocol CT (which would be less
susceptible to motion artifact) is probably warranted. Based on my
review of the ultrasound, this probably represents the lesion
thought to be arising from the left kidney; it is fairly closely
associated with the left kidney upper pole.

Spleen:  Unremarkable

Adrenals/Urinary Tract: The adrenal glands appear normal. As noted
above the lesion seen on ultrasound thought to potentially be
arising from the left kidney is probably an accessory spleen, and
less likely to be ultimately arising from the pancreas or kidney,
but needs further workup with pancreatic protocol CT.

Other tiny cysts of the left kidney are present.

Stomach/Bowel: Unremarkable

Vascular/Lymphatic:  No adenopathy.

Other:  No supplemental non-categorized findings.

Musculoskeletal: Degenerative disc disease at L5-S1.
IMPRESSION: 1. The lesion of concern adjacent to the left kidney upper pole on
sonography measures 2.1 by 1.7 by 1.6 cm and is closely associated
with the posterior margin of the pancreatic tail. This is probably
accessory splenic tissue, less likely to be a pancreatic mass, and
even less likely to be a mass arising from the kidney. Severe motion
artifact causes the lesion to be obscured and blurred on many
sequences including all of the postcontrast sequences. Pancreatic
protocol CT with and without contrast is recommended to further
characterize this lesion.
2. Degenerative disc disease at L5-S1.
3. Cardiomegaly.
4. Despite efforts by the technologist and patient, motion artifact
is present on today's exam and could not be eliminated. This reduces
exam sensitivity and specificity.

## 2019-11-28 MED ORDER — GADOBUTROL 1 MMOL/ML IV SOLN
9.0000 mL | Freq: Once | INTRAVENOUS | Status: AC | PRN
  Administered 2019-11-28: 9 mL via INTRAVENOUS

## 2019-12-03 ENCOUNTER — Telehealth: Payer: Self-pay

## 2019-12-03 NOTE — Telephone Encounter (Signed)
Called pt's legal guardian, no answer. Unable to leave message as voicemail is full.

## 2019-12-03 NOTE — Telephone Encounter (Signed)
-----   Message from Billey Co, MD sent at 11/30/2019  2:43 PM EDT ----- Cyst that was on the kidney does not appear to be a solid mass or worrisome for malignancy.  Would recommend following up with PCP for consideration of a repeat CT of the pancreas per radiology recommendations.  No urology follow-up needed.  [name], MD 11/30/2019

## 2019-12-04 NOTE — Telephone Encounter (Signed)
Called pt's group home. No answer. Second attempt.

## 2019-12-10 NOTE — Telephone Encounter (Signed)
Called pt's group home spoke with charge nurse, gave her verbal report. She voiced understanding.

## 2020-07-30 ENCOUNTER — Other Ambulatory Visit: Payer: Self-pay

## 2020-07-30 ENCOUNTER — Ambulatory Visit: Payer: Medicare Other | Attending: Internal Medicine

## 2020-07-30 DIAGNOSIS — Z23 Encounter for immunization: Secondary | ICD-10-CM

## 2020-07-30 NOTE — Progress Notes (Signed)
   Covid-19 Vaccination Clinic  Name:  Martin Diaz    MRN: [id number] DOB: Dec 13, 1960  07/30/2020  Mr. [name] was observed post Covid-19 immunization for 15 minutes without incident. He was provided with Vaccine Information Sheet and instruction to access the V-Safe system.   Mr. [name] was instructed to call 911 with any severe reactions post vaccine: Marland Kitchen Difficulty breathing  . Swelling of face and throat  . A fast heartbeat  . A bad rash all over body  . Dizziness and weakness   Immunizations Administered    Name Date Dose VIS Date Route   Pfizer COVID-19 Vaccine 07/30/2020  3:40 PM 0.3 mL 05/28/2020 Intramuscular   Manufacturer: [place]   Lot: [id number]   NDC: [address]-3

## 2021-03-17 ENCOUNTER — Encounter: Payer: Self-pay | Admitting: Emergency Medicine

## 2021-03-17 ENCOUNTER — Other Ambulatory Visit: Payer: Self-pay

## 2021-03-17 ENCOUNTER — Emergency Department
Admission: EM | Admit: 2021-03-17 | Discharge: 2021-03-17 | Disposition: A | Discharge status: Home / Self Care | Payer: Medicare Other | Attending: Emergency Medicine | Admitting: Emergency Medicine

## 2021-03-17 DIAGNOSIS — F79 Unspecified intellectual disabilities: Secondary | ICD-10-CM | POA: Insufficient documentation

## 2021-03-17 DIAGNOSIS — R899 Unspecified abnormal finding in specimens from other organs, systems and tissues: Secondary | ICD-10-CM

## 2021-03-17 DIAGNOSIS — Z8659 Personal history of other mental and behavioral disorders: Secondary | ICD-10-CM | POA: Insufficient documentation

## 2021-03-17 DIAGNOSIS — R799 Abnormal finding of blood chemistry, unspecified: Secondary | ICD-10-CM | POA: Diagnosis not present

## 2021-03-17 DIAGNOSIS — F1011 Alcohol abuse, in remission: Secondary | ICD-10-CM

## 2021-03-17 LAB — COMPREHENSIVE METABOLIC PANEL
ALT: 40 U/L (ref 0–44)
AST: 38 U/L (ref 15–41)
Albumin: 4.5 g/dL (ref 3.5–5.0)
Alkaline Phosphatase: 58 U/L (ref 38–126)
Anion gap: 8 (ref 5–15)
BUN: 13 mg/dL (ref 6–20)
CO2: 30 mmol/L (ref 22–32)
Calcium: 10.3 mg/dL (ref 8.9–10.3)
Chloride: 104 mmol/L (ref 98–111)
Creatinine, Ser: 0.96 mg/dL (ref 0.61–1.24)
GFR, Estimated: >60 mL/min (ref >60)
Glucose, Bld: 99 mg/dL (ref 70–99)
Potassium: 3.6 mmol/L (ref 3.5–5.1)
Sodium: 142 mmol/L (ref 135–145)
Total Bilirubin: 0.6 mg/dL (ref 0.3–1.2)
Total Protein: 8.4 g/dL — ABNORMAL HIGH (ref 6.5–8.1)

## 2021-03-17 LAB — CBC WITH DIFFERENTIAL/PLATELET
Abs Immature Granulocytes: 0.01 10*3/uL (ref 0.00–0.07)
Basophils Absolute: 0 10*3/uL (ref 0.0–0.1)
Basophils Relative: 1 %
Eosinophils Absolute: 0.2 10*3/uL (ref 0.0–0.5)
Eosinophils Relative: 4 %
HCT: 43.2 % (ref 39.0–52.0)
Hemoglobin: 14.1 g/dL (ref 13.0–17.0)
Immature Granulocytes: 0 %
Lymphocytes Relative: 41 %
Lymphs Abs: 1.8 10*3/uL (ref 0.7–4.0)
MCH: 27.4 pg (ref 26.0–34.0)
MCHC: 32.6 g/dL (ref 30.0–36.0)
MCV: 83.9 fL (ref 80.0–100.0)
Monocytes Absolute: 0.5 10*3/uL (ref 0.1–1.0)
Monocytes Relative: 11 %
Neutro Abs: 1.9 10*3/uL (ref 1.7–7.7)
Neutrophils Relative %: 43 %
Platelets: 217 10*3/uL (ref 150–400)
RBC: 5.15 MIL/uL (ref 4.22–5.81)
RDW: 13.8 % (ref 11.5–15.5)
WBC: 4.3 10*3/uL (ref 4.0–10.5)
nRBC: 0 % (ref 0.0–0.2)

## 2021-03-17 LAB — BASIC METABOLIC PANEL
Anion gap: 14 (ref 5–15)
BUN: 14 mg/dL (ref 6–20)
CO2: 26 mmol/L (ref 22–32)
Calcium: 9.9 mg/dL (ref 8.9–10.3)
Chloride: 100 mmol/L (ref 98–111)
Creatinine, Ser: 1.1 mg/dL (ref 0.61–1.24)
GFR, Estimated: >60 mL/min (ref >60)
Glucose, Bld: 86 mg/dL (ref 70–99)
Potassium: 3.3 mmol/L — ABNORMAL LOW (ref 3.5–5.1)
Sodium: 140 mmol/L (ref 135–145)

## 2021-03-17 NOTE — ED Triage Notes (Signed)
Arrives for ED evaluation of elevated blood work. Per telephone report, K: 9.  Patient is AAOx3.  Skin warm and dry. NAD

## 2021-03-17 NOTE — ED Provider Notes (Signed)
[facility] Emergency Department Provider Note   ____________________________________________   Event Date/Time   First MD Initiated Contact with Patient 03/17/21 1247     (approximate)  I have reviewed the triage vital signs and the nursing notes.   HISTORY  Chief Complaint abnormal labs    HPI Martin Diaz is a 60 y.o. male with past medical history of intellectual disability, alcohol abuse, pancreatitis, and rhabdomyolysis who presents to the ED for abnormal labs.  Patient was initially seen at his PCPs office yesterday for routine follow-up, caregiver then received a phone call earlier today about abnormal labs.  There was concern that his potassium was significantly elevated along with a low calcium and very low iron.  History is limited due to patient's intellectual disability, caretaker at bedside states he is at his baseline mental status.  Patient denies any complaints and caretaker has not noticed any nausea, vomiting, diarrhea, or pain.  He has been taking his medications as prescribed, eating and drinking well per caretaker.        Past Medical History:  Diagnosis Date   Alcohol use     Patient Active Problem List   Diagnosis Date Noted   Metabolic acidosis    Non-traumatic rhabdomyolysis    Acute renal failure (ARF) ([place]) 06/10/2019   Pancreatitis, alcoholic, acute [id number]   Alcohol use 06/10/2019   Hypocalcemia 06/10/2019    History reviewed. No pertinent surgical history.  Prior to Admission medications   Medication Sig Start Date End Date Taking? Authorizing Provider  amLODipine (NORVASC) 10 MG tablet Take 1 tablet (10 mg total) by mouth daily. 06/28/19   Ezekiel Slocumb, DO  cloNIDine (CATAPRES - DOSED IN MG/24 HR) 0.2 mg/24hr patch Place 1 patch (0.2 mg total) onto the skin once a week. 07/01/19   Ezekiel Slocumb, DO  feeding supplement, ENSURE ENLIVE, (ENSURE ENLIVE) LIQD Take 237 mLs by mouth 2 (two) times  daily between meals. 06/27/19   Ezekiel Slocumb, DO  folic acid (FOLVITE) 1 MG tablet Take 1 tablet (1 mg total) by mouth daily. 06/28/19   Nicole Kindred A, DO  hydrALAZINE (APRESOLINE) 50 MG tablet Take 1 tablet (50 mg total) by mouth every 8 (eight) hours. 06/27/19   Ezekiel Slocumb, DO  megestrol (MEGACE) 400 MG/10ML suspension Take 10 mLs (400 mg total) by mouth 2 (two) times daily. 06/27/19   Nicole Kindred A, DO  metoprolol tartrate (LOPRESSOR) 25 MG tablet Take 1 tablet (25 mg total) by mouth 2 (two) times daily. 06/27/19   Ezekiel Slocumb, DO  Multiple Vitamin (MULTIVITAMIN WITH MINERALS) TABS tablet Take 1 tablet by mouth daily. 06/28/19   Nicole Kindred A, DO  thiamine 100 MG tablet Take 1 tablet (100 mg total) by mouth daily. 06/28/19   Ezekiel Slocumb, DO  vitamin C (VITAMIN C) 250 MG tablet Take 1 tablet (250 mg total) by mouth 2 (two) times daily. 06/27/19   Ezekiel Slocumb, DO    Allergies Patient has no known allergies.  No family history on file.  Social History Social History   Tobacco Use   Smoking status: Never   Smokeless tobacco: Never  Substance Use Topics   Alcohol use: Yes    Comment: says he hasn't been drinking   Drug use: Not Currently    Review of Systems  Constitutional: No fever/chills.  Positive for abnormal labs. Eyes: No visual changes. ENT: No sore throat. Cardiovascular: Denies chest pain. Respiratory: Denies shortness  of breath. Gastrointestinal: No abdominal pain.  No nausea, no vomiting.  No diarrhea.  No constipation. Genitourinary: Negative for dysuria. Musculoskeletal: Negative for back pain. Skin: Negative for rash. Neurological: Negative for headaches, focal weakness or numbness.  ____________________________________________   PHYSICAL EXAM:  VITAL SIGNS: ED Triage Vitals  Enc Vitals Group     BP 03/17/21 1058 (!) 142/76     Pulse Rate 03/17/21 1058 63     Resp 03/17/21 1058 14     Temp 03/17/21 1058 97.8 F  (36.6 C)     Temp Source 03/17/21 1058 Oral     SpO2 03/17/21 1058 98 %     Weight 03/17/21 1053 199 lb 4.7 oz (90.4 kg)     Height 03/17/21 1053 6' (1.829 m)     Head Circumference --      Peak Flow --      Pain Score 03/17/21 1053 0     Pain Loc --      Pain Edu? --      Excl. in [place]? --     Constitutional: Awake and alert. Eyes: Conjunctivae are normal. Head: Atraumatic. Nose: No congestion/rhinnorhea. Mouth/Throat: Mucous membranes are moist. Neck: Normal ROM Cardiovascular: Normal rate, regular rhythm. Grossly normal heart sounds.  2+ radial pulses bilaterally. Respiratory: Normal respiratory effort.  No retractions. Lungs CTAB. Gastrointestinal: Soft and nontender. No distention. Genitourinary: deferred Musculoskeletal: No lower extremity tenderness nor edema. Neurologic:  Normal speech and language. No gross focal neurologic deficits are appreciated. Skin:  Skin is warm, dry and intact. No rash noted. Psychiatric: Mood and affect are normal. Speech and behavior are normal.  ____________________________________________   LABS (all labs ordered are listed, but only abnormal results are displayed)  Labs Reviewed  COMPREHENSIVE METABOLIC PANEL - Abnormal; Notable for the following components:      Result Value   Total Protein 8.4 (*)    All other components within normal limits  BASIC METABOLIC PANEL - Abnormal; Notable for the following components:   Potassium 3.3 (*)    All other components within normal limits  CBC WITH DIFFERENTIAL/PLATELET   ____________________________________________  EKG  ED ECG REPORT I, Blake Divine, the attending physician, personally viewed and interpreted this ECG.   Date: 03/17/2021  EKG Time: 11:02  Rate: 65  Rhythm: normal sinus rhythm  Axis: RAD  Intervals:none  ST&T Change: None   PROCEDURES  Procedure(s) performed (including Critical Care):  Procedures   ____________________________________________   INITIAL  IMPRESSION / ASSESSMENT AND PLAN / ED COURSE      60 year old male with past medical history of intellectual disability, alcohol abuse, pancreatitis, and rhabdomyolysis who presents to the ED for further evaluation after PCP noted abnormal labs during routine visit yesterday.  Patient appears at his baseline mental status, appears well-hydrated with no apparent complaints at this time.  Labs here in the ED are within normal limits, I spoke with his PCP who was concerned primarily with his potassium and low calcium.  I suspect these abnormal findings yesterday were due to hemolysis as he was reportedly a difficult stick and they could only obtain a small amount of blood.  We will repeat BMP here in the ED to ensure accuracy, but if these are within normal limits he would be appropriate for discharge home with close PCP follow-up.  Repeat BMP remains within normal limits, patient is appropriate for discharge home with PCP follow-up for recheck of labs.  Caregiver was counseled to have him return to the ED  for any new or worsening symptoms.  Caregiver agrees with plan.      ____________________________________________   FINAL CLINICAL IMPRESSION(S) / ED DIAGNOSES  Final diagnoses:  Intellectual disability  History of alcohol abuse  Abnormal laboratory test result     ED Discharge Orders     None        Note:  This document was prepared using Dragon voice recognition software and may include unintentional dictation errors.    Blake Divine, MD 03/17/21 [phone]

## 2021-03-17 NOTE — ED Notes (Signed)
See triage note  Presents with care giver  States they were sent in d/t abnormal lab work  Went to PCP for abd pain and to have labs drawn  Afebrile on arrival

## 2021-06-13 ENCOUNTER — Emergency Department: Payer: Medicare Other

## 2021-06-13 ENCOUNTER — Other Ambulatory Visit: Payer: Self-pay

## 2021-06-13 ENCOUNTER — Encounter: Payer: Self-pay | Admitting: Emergency Medicine

## 2021-06-13 DIAGNOSIS — R0602 Shortness of breath: Secondary | ICD-10-CM | POA: Diagnosis not present

## 2021-06-13 DIAGNOSIS — E119 Type 2 diabetes mellitus without complications: Secondary | ICD-10-CM | POA: Diagnosis not present

## 2021-06-13 DIAGNOSIS — Y900 Blood alcohol level of less than 20 mg/100 ml: Secondary | ICD-10-CM | POA: Diagnosis not present

## 2021-06-13 DIAGNOSIS — Z20822 Contact with and (suspected) exposure to covid-19: Secondary | ICD-10-CM | POA: Insufficient documentation

## 2021-06-13 DIAGNOSIS — G9341 Metabolic encephalopathy: Secondary | ICD-10-CM | POA: Diagnosis not present

## 2021-06-13 DIAGNOSIS — R2681 Unsteadiness on feet: Secondary | ICD-10-CM | POA: Insufficient documentation

## 2021-06-13 DIAGNOSIS — I10 Essential (primary) hypertension: Secondary | ICD-10-CM | POA: Insufficient documentation

## 2021-06-13 DIAGNOSIS — R4182 Altered mental status, unspecified: Secondary | ICD-10-CM | POA: Diagnosis present

## 2021-06-13 DIAGNOSIS — Z79899 Other long term (current) drug therapy: Secondary | ICD-10-CM | POA: Insufficient documentation

## 2021-06-13 LAB — COMPREHENSIVE METABOLIC PANEL
ALT: 34 U/L (ref 0–44)
AST: 30 U/L (ref 15–41)
Albumin: 3.6 g/dL (ref 3.5–5.0)
Alkaline Phosphatase: 49 U/L (ref 38–126)
Anion gap: 6 (ref 5–15)
BUN: 13 mg/dL (ref 6–20)
CO2: 26 mmol/L (ref 22–32)
Calcium: 8.6 mg/dL — ABNORMAL LOW (ref 8.9–10.3)
Chloride: 106 mmol/L (ref 98–111)
Creatinine, Ser: 0.8 mg/dL (ref 0.61–1.24)
GFR, Estimated: >60 mL/min (ref >60)
Glucose, Bld: 106 mg/dL — ABNORMAL HIGH (ref 70–99)
Potassium: 3.4 mmol/L — ABNORMAL LOW (ref 3.5–5.1)
Sodium: 138 mmol/L (ref 135–145)
Total Bilirubin: 0.8 mg/dL (ref 0.3–1.2)
Total Protein: 7.1 g/dL (ref 6.5–8.1)

## 2021-06-13 LAB — CBC
HCT: 39 % (ref 39.0–52.0)
Hemoglobin: 12.8 g/dL — ABNORMAL LOW (ref 13.0–17.0)
MCH: 27.8 pg (ref 26.0–34.0)
MCHC: 32.8 g/dL (ref 30.0–36.0)
MCV: 84.6 fL (ref 80.0–100.0)
Platelets: 184 10*3/uL (ref 150–400)
RBC: 4.61 MIL/uL (ref 4.22–5.81)
RDW: 13 % (ref 11.5–15.5)
WBC: 3.5 10*3/uL — ABNORMAL LOW (ref 4.0–10.5)
nRBC: 0 % (ref 0.0–0.2)

## 2021-06-13 IMAGING — DX DG CHEST 1V
1 series · 1 of 1 positions shown · non-contrast
Comparison: [DATE]

CLINICAL DATA: Altered mental status.

EXAM:
CHEST  1 VIEW

[chest ap]
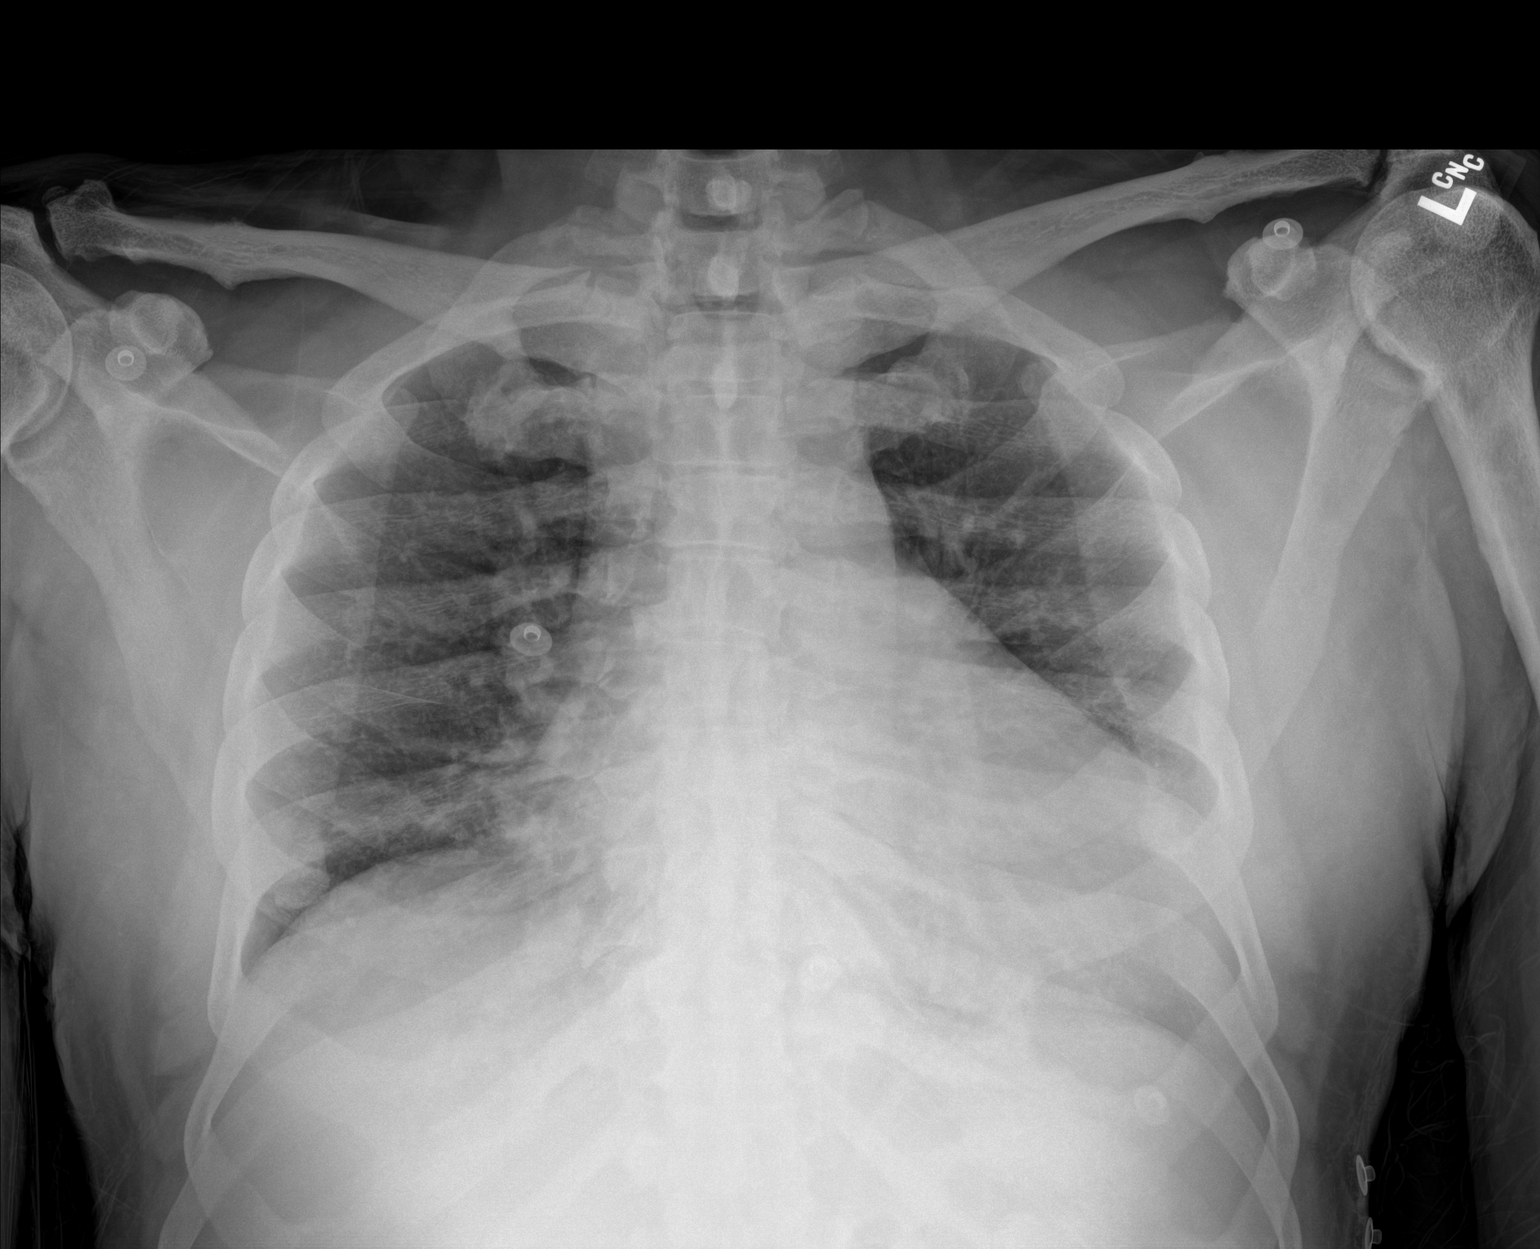

[1 of 1 positions shown; findings below may reference images not displayed]

FINDINGS: Enlarged cardiac silhouette.  Mediastinal contours appear intact.

No lobar airspace consolidation. Diffuse prominence of the
interstitium.

Osseous structures are without acute abnormality. Soft tissues are
grossly normal.
IMPRESSION: 1. Enlarged cardiac silhouette.
2. Diffuse prominence of the interstitium may represent interstitial
pulmonary edema or atypical infection.

## 2021-06-13 NOTE — ED Triage Notes (Signed)
Pt via EMS from [facility]. Pt is usually ambulatory and but today he would not get out of been. Pt is moaning on arrival but not able to answer any questions. Pt is alert but not answer question. Group Home report cough and runny nose.

## 2021-06-13 NOTE — ED Triage Notes (Signed)
Pt in via EMS from a [place] with c/o AMS. Pt is usually walking and talking and today he wouldn't get out of bed and also has a cough and runny nose. 115/65, FSBS 149, 98% RA, #20g to right FA, had 551mls of fluid enroute.

## 2021-06-14 ENCOUNTER — Emergency Department: Payer: Medicare Other

## 2021-06-14 ENCOUNTER — Observation Stay
Admission: EM | Admit: 2021-06-14 | Discharge: 2021-06-16 | Disposition: A | Discharge status: Home / Self Care | Payer: Medicare Other | Attending: Internal Medicine | Admitting: Internal Medicine

## 2021-06-14 ENCOUNTER — Encounter: Payer: Self-pay | Admitting: Internal Medicine

## 2021-06-14 ENCOUNTER — Observation Stay: Payer: Medicare Other

## 2021-06-14 DIAGNOSIS — Z789 Other specified health status: Secondary | ICD-10-CM | POA: Diagnosis not present

## 2021-06-14 DIAGNOSIS — R531 Weakness: Secondary | ICD-10-CM

## 2021-06-14 DIAGNOSIS — I10 Essential (primary) hypertension: Secondary | ICD-10-CM

## 2021-06-14 DIAGNOSIS — E119 Type 2 diabetes mellitus without complications: Secondary | ICD-10-CM

## 2021-06-14 DIAGNOSIS — G9341 Metabolic encephalopathy: Secondary | ICD-10-CM | POA: Diagnosis present

## 2021-06-14 DIAGNOSIS — R059 Cough, unspecified: Secondary | ICD-10-CM | POA: Diagnosis present

## 2021-06-14 DIAGNOSIS — R051 Acute cough: Secondary | ICD-10-CM

## 2021-06-14 DIAGNOSIS — R4182 Altered mental status, unspecified: Secondary | ICD-10-CM

## 2021-06-14 DIAGNOSIS — F32A Depression, unspecified: Secondary | ICD-10-CM | POA: Diagnosis present

## 2021-06-14 DIAGNOSIS — R0602 Shortness of breath: Secondary | ICD-10-CM

## 2021-06-14 DIAGNOSIS — E1169 Type 2 diabetes mellitus with other specified complication: Secondary | ICD-10-CM

## 2021-06-14 DIAGNOSIS — E785 Hyperlipidemia, unspecified: Secondary | ICD-10-CM

## 2021-06-14 DIAGNOSIS — J189 Pneumonia, unspecified organism: Secondary | ICD-10-CM

## 2021-06-14 DIAGNOSIS — E876 Hypokalemia: Secondary | ICD-10-CM

## 2021-06-14 HISTORY — DX: Acute pancreatitis without necrosis or infection, unspecified: K85.90

## 2021-06-14 HISTORY — DX: Rhabdomyolysis: M62.82

## 2021-06-14 HISTORY — DX: Dysphagia, unspecified: R13.10

## 2021-06-14 HISTORY — DX: Unspecified intellectual disabilities: F79

## 2021-06-14 HISTORY — DX: Depression, unspecified: F32.A

## 2021-06-14 HISTORY — DX: Disorder of kidney and ureter, unspecified: N28.9

## 2021-06-14 LAB — BRAIN NATRIURETIC PEPTIDE: B Natriuretic Peptide: 50.4 pg/mL (ref 0.0–100.0)

## 2021-06-14 LAB — GLUCOSE, CAPILLARY: Glucose-Capillary: 105 mg/dL — ABNORMAL HIGH (ref 70–99)

## 2021-06-14 LAB — URINE DRUG SCREEN, QUALITATIVE (ARMC ONLY)
Amphetamines, Ur Screen: NOT DETECTED
Barbiturates, Ur Screen: NOT DETECTED
Benzodiazepine, Ur Scrn: NOT DETECTED
Cannabinoid 50 Ng, Ur ~~LOC~~: NOT DETECTED
Cocaine Metabolite,Ur ~~LOC~~: NOT DETECTED
MDMA (Ecstasy)Ur Screen: NOT DETECTED
Methadone Scn, Ur: NOT DETECTED
Opiate, Ur Screen: NOT DETECTED
Phencyclidine (PCP) Ur S: NOT DETECTED
Tricyclic, Ur Screen: POSITIVE — AB

## 2021-06-14 LAB — MAGNESIUM: Magnesium: 1.9 mg/dL (ref 1.7–2.4)

## 2021-06-14 LAB — URINALYSIS, COMPLETE (UACMP) WITH MICROSCOPIC
Bacteria, UA: NONE SEEN
Bilirubin Urine: NEGATIVE
Glucose, UA: >=500 mg/dL — AB
Hgb urine dipstick: NEGATIVE
Ketones, ur: 5 mg/dL — AB
Leukocytes,Ua: NEGATIVE
Nitrite: NEGATIVE
Protein, ur: NEGATIVE mg/dL
Specific Gravity, Urine: 1.028 (ref 1.005–1.030)
pH: 5 (ref 5.0–8.0)

## 2021-06-14 LAB — TROPONIN I (HIGH SENSITIVITY): Troponin I (High Sensitivity): 7 ng/L (ref <18)

## 2021-06-14 LAB — ETHANOL: Alcohol, Ethyl (B): <10 mg/dL (ref <10)

## 2021-06-14 LAB — TSH: TSH: 0.306 u[IU]/mL — ABNORMAL LOW (ref 0.350–4.500)

## 2021-06-14 LAB — PROCALCITONIN: Procalcitonin: <0.1 ng/mL

## 2021-06-14 LAB — CBG MONITORING, ED: Glucose-Capillary: 97 mg/dL (ref 70–99)

## 2021-06-14 LAB — RESP PANEL BY RT-PCR (FLU A&B, COVID) ARPGX2
Influenza A by PCR: NEGATIVE
Influenza B by PCR: NEGATIVE
SARS Coronavirus 2 by RT PCR: NEGATIVE

## 2021-06-14 LAB — AMMONIA: Ammonia: 21 umol/L (ref 9–35)

## 2021-06-14 IMAGING — DX DG ORBITS COMPLETE 4+V
2 series · 2 of 2 positions shown · non-contrast
Comparison: None.

CLINICAL DATA: Radiographic clearance for MRI due to altered mental
status.

EXAM:
ORBITS - COMPLETE 4+ VIEW

[orbital axial (1 of 2)]
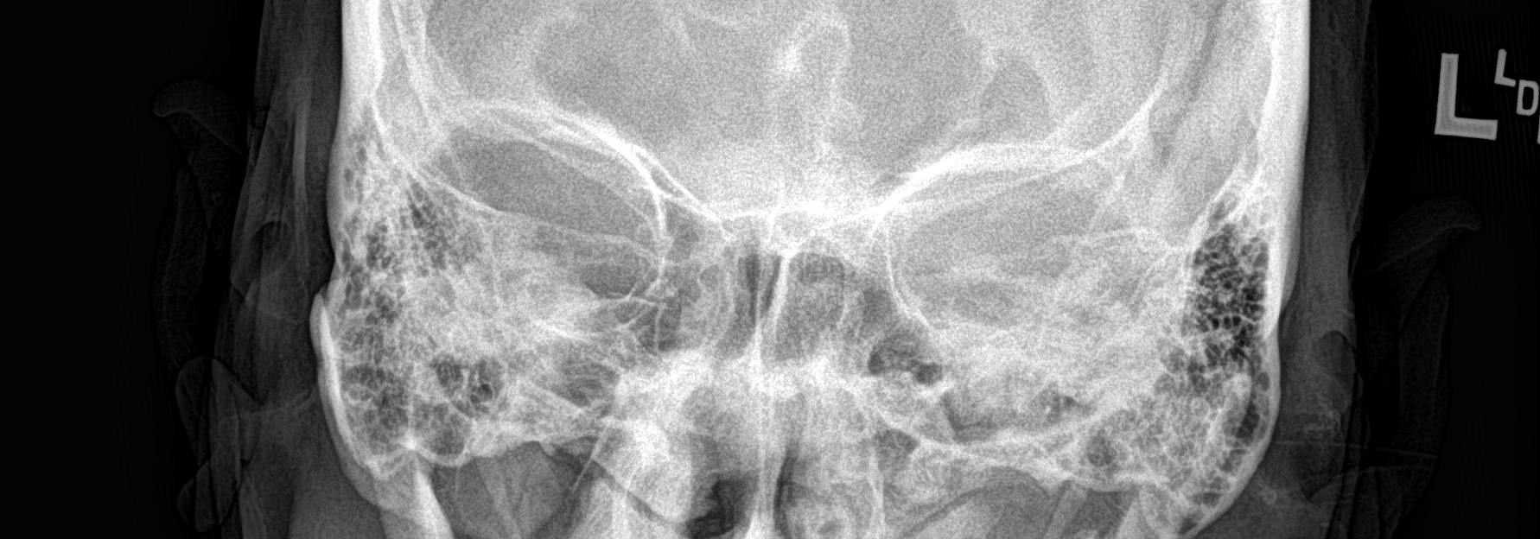

[orbital axial (2 of 2)]
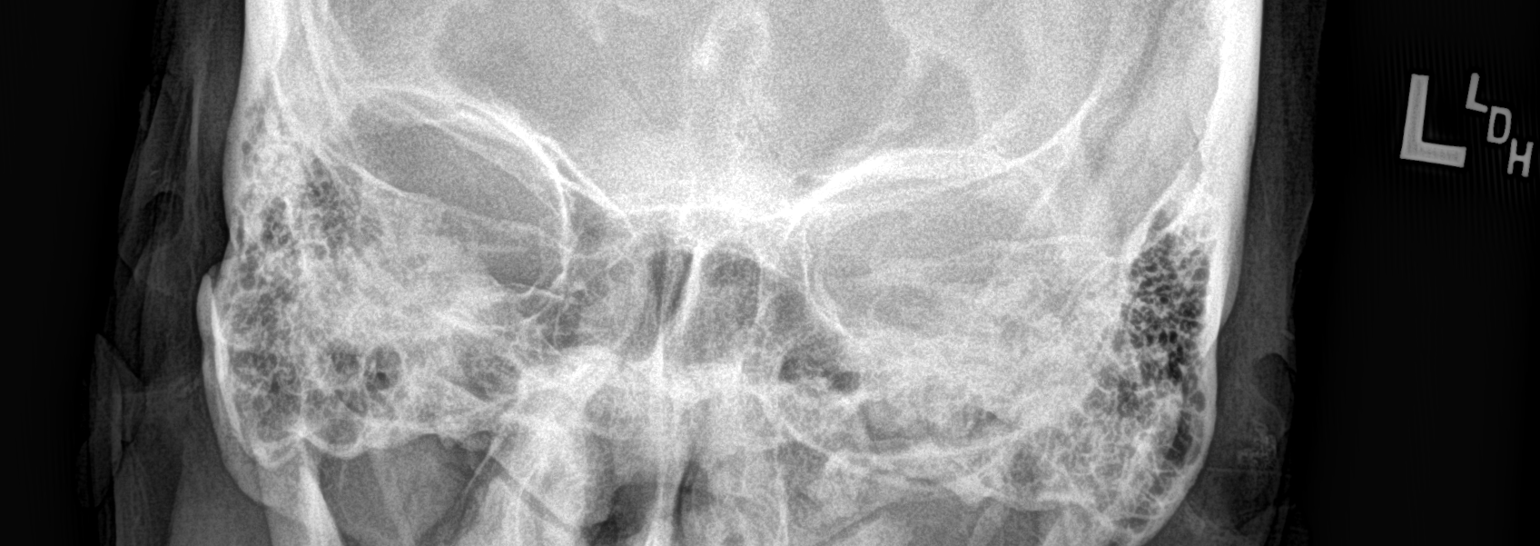

[2 of 2 positions shown; findings below may reference images not displayed]

FINDINGS: There is no evidence of fracture or other significant bone
abnormality. No orbital emphysema or sinus air-fluid levels are
seen. No radiopaque foreign body.
IMPRESSION: No radiopaque foreign body.

## 2021-06-14 IMAGING — DX DG ABDOMEN 1V
3 series · 3 of 3 positions shown · non-contrast
Comparison: Abdominal radiograph [DATE]

CLINICAL DATA: Clearance for MRI, patient unable to give detailed
medical history due to altered mental status.

EXAM:
ABDOMEN - 1 VIEW

[abdomen supine (1 of 3)]
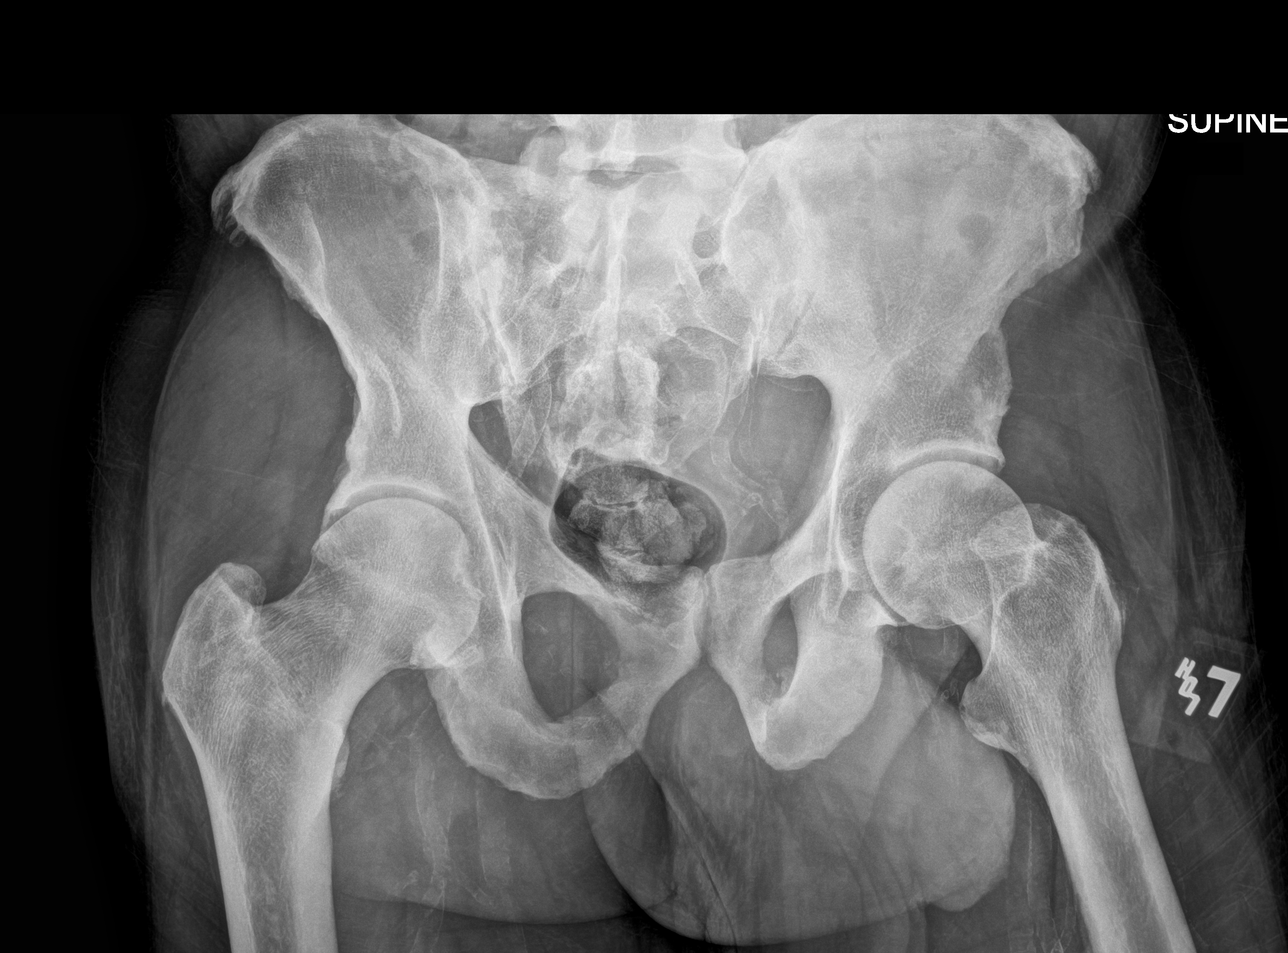

[abdomen supine (2 of 3)]
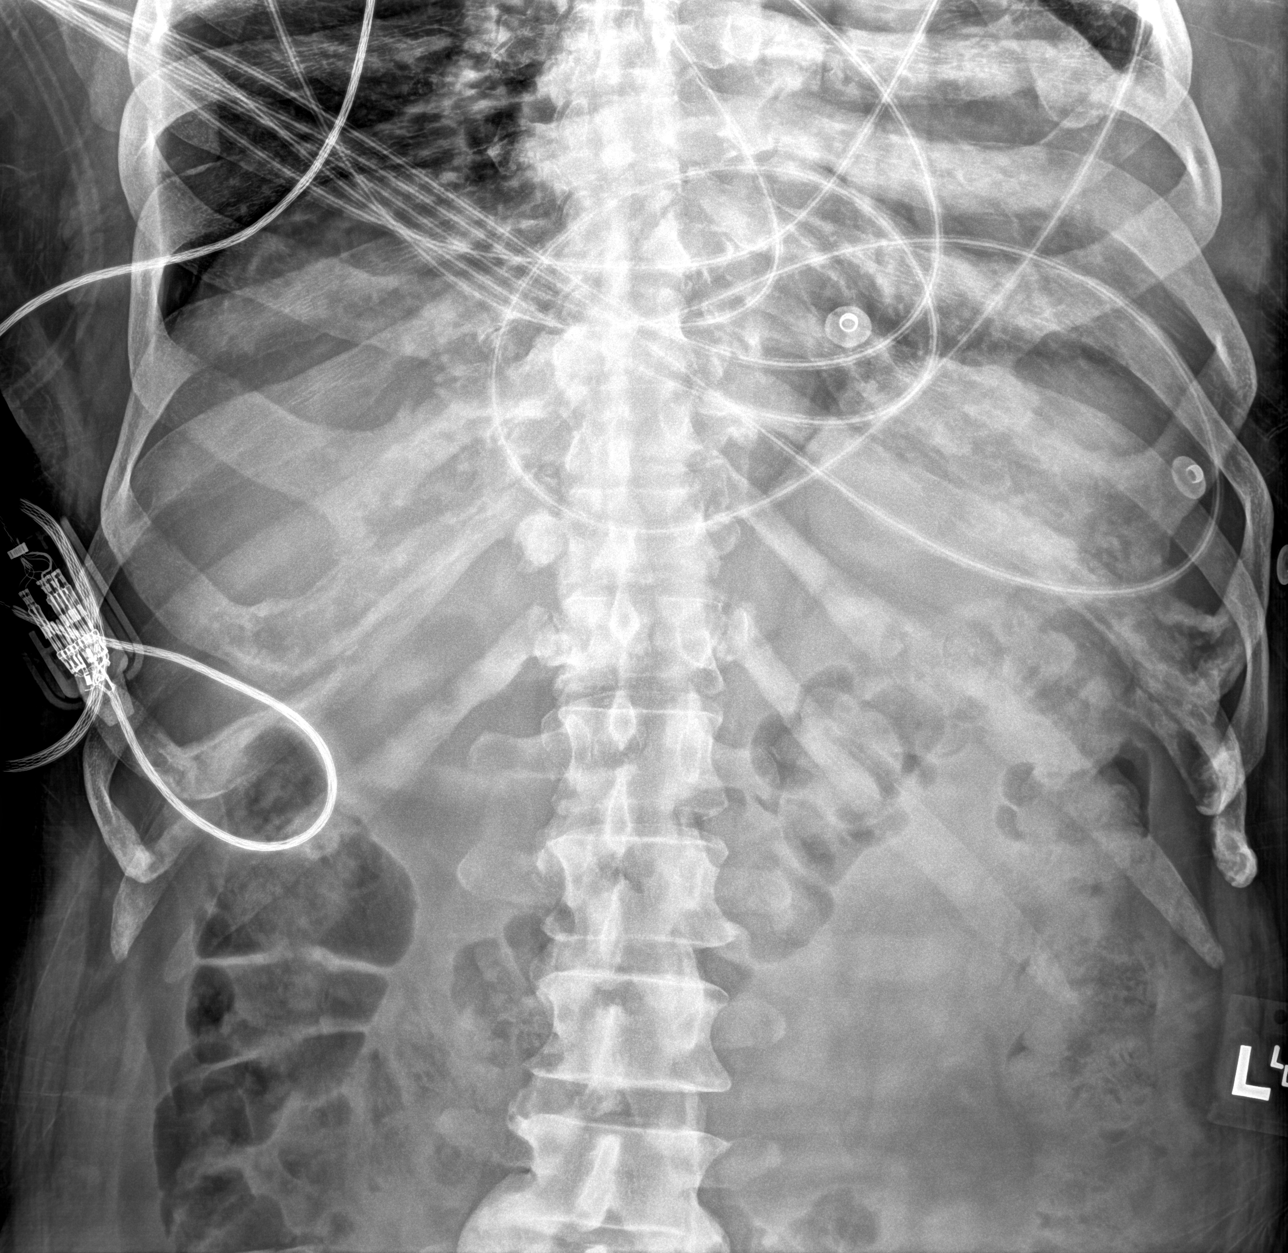

[abdomen supine (3 of 3)]
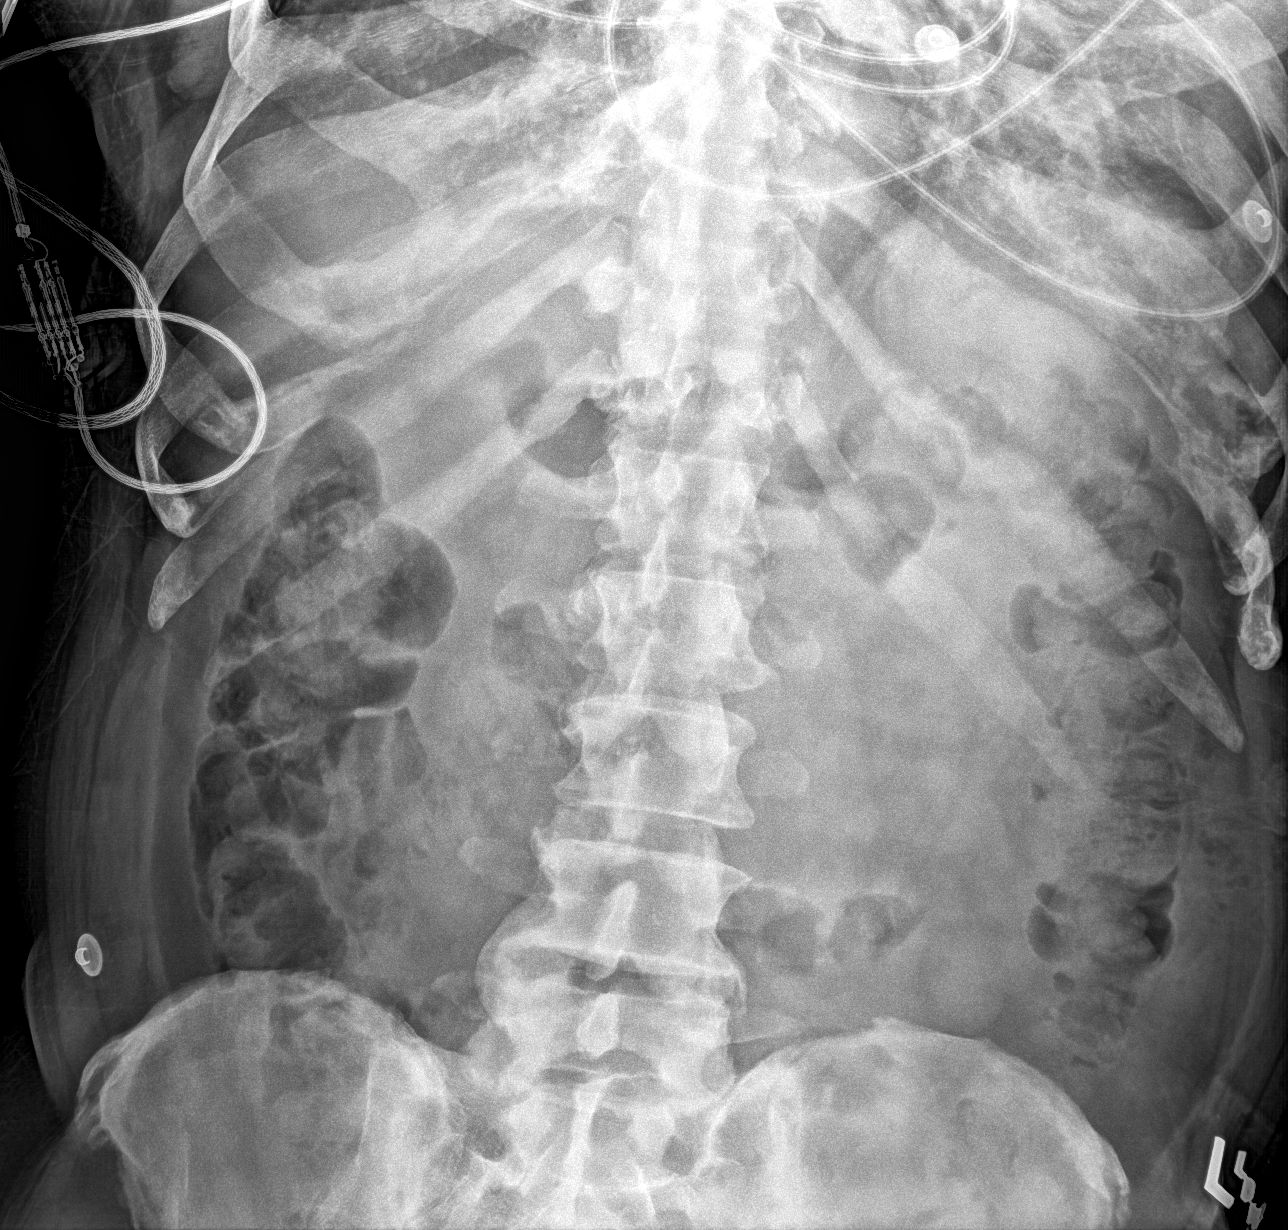

[3 of 3 positions shown; findings below may reference images not displayed]

FINDINGS: The bowel gas pattern is normal. No radio-opaque foreign body.
Degenerative changes spine.
IMPRESSION: Nonobstructive bowel gas pattern.

No radiopaque foreign body.

## 2021-06-14 IMAGING — DX DG PORTABLE PELVIS
1 series · 1 of 1 positions shown · non-contrast
Comparison: None.

CLINICAL DATA: Difficulty walking, alcohol abuse, generalized
weakness, no reported injury

EXAM:
PORTABLE PELVIS 1-2 VIEWS

[pelvis ap]
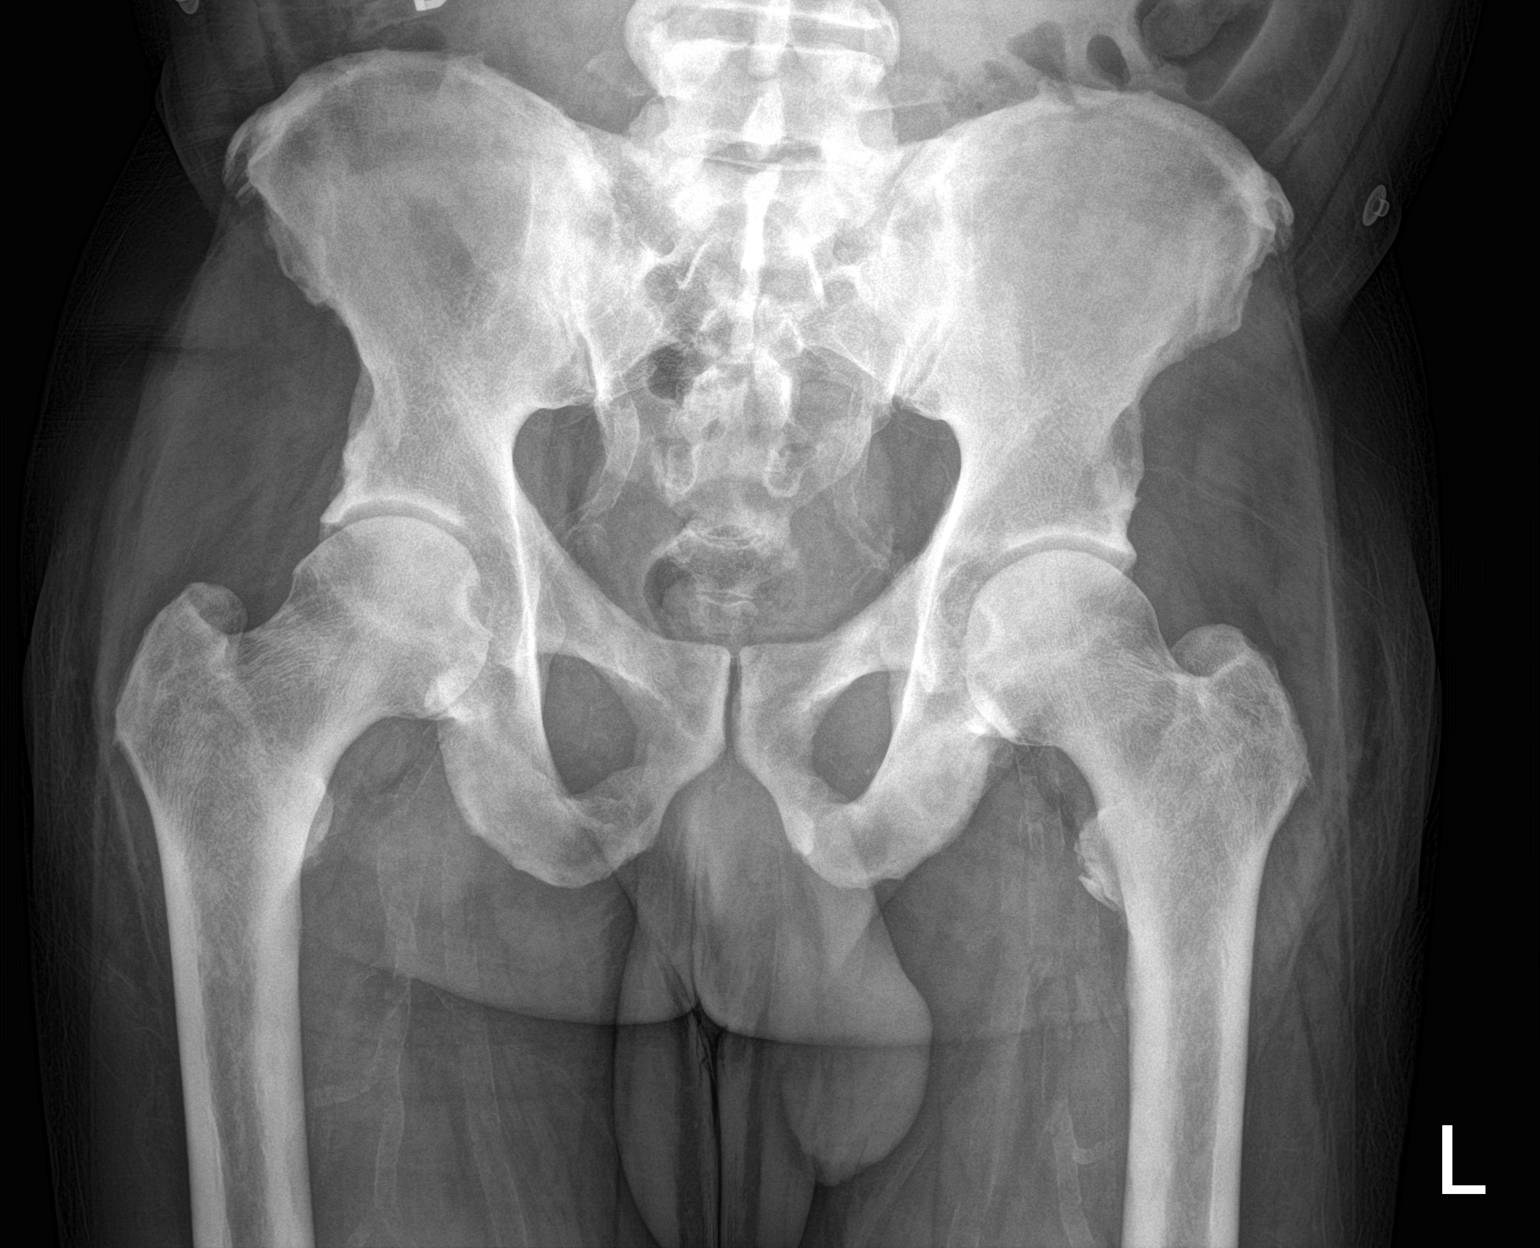

[1 of 1 positions shown; findings below may reference images not displayed]

FINDINGS: No pelvic fracture or diastasis. No evidence of hip dislocation on
this frontal view. No discrete osseous lesions. Minimal right hip
osteoarthritis. Mild lower lumbar spondylosis. Scattered small iliac
wing enthesophytes bilaterally. Vascular calcifications throughout
the soft tissues.
IMPRESSION: No acute osseous abnormality. Minimal right hip osteoarthritis.

## 2021-06-14 IMAGING — CT CT HEAD W/O CM
3 series · 15 of 47 positions shown, 18 images · non-contrast
Comparison: None.

CLINICAL DATA: Encephalopathy

EXAM:
CT HEAD WITHOUT CONTRAST
TECHNIQUE: Contiguous axial images were obtained from the base of the skull
through the vertex without intravenous contrast.

[Series 2: head wo · axial · 0.47mm/px · z∈[-68,+82]mm · 9 of 36 slices shown, 12 images]
[im 3/36  brain]
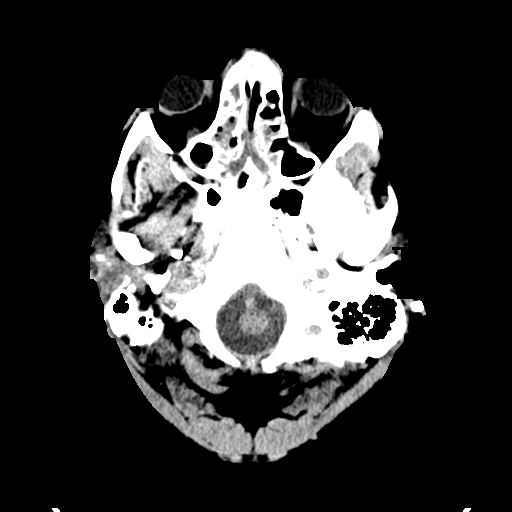
[im 3/36  bone]
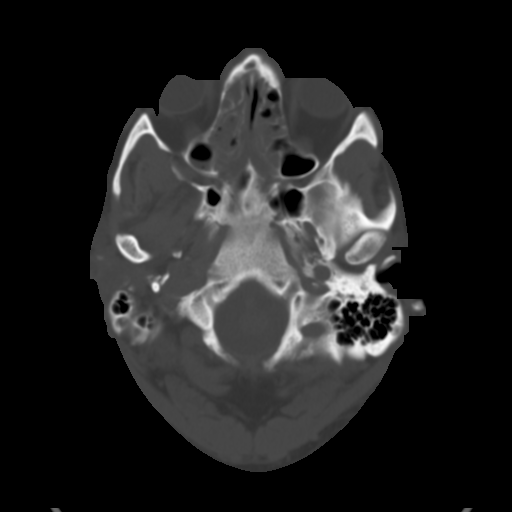
[im 7/36  brain]
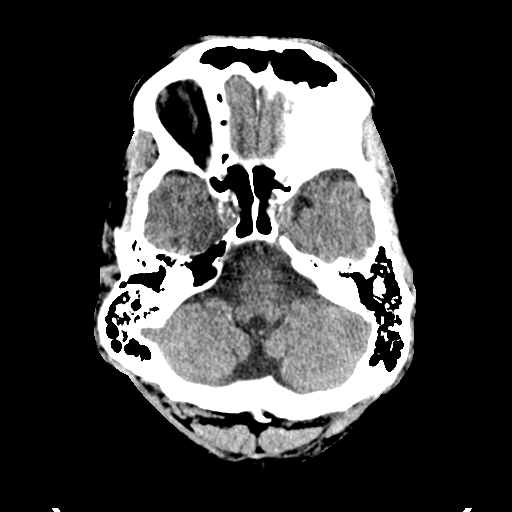
[im 10/36  brain]
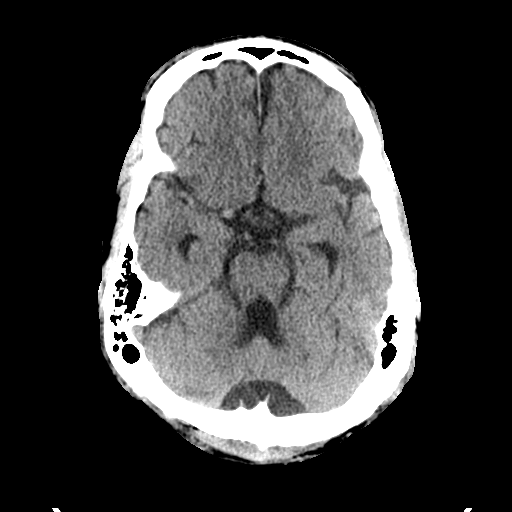
[im 14/36  brain]
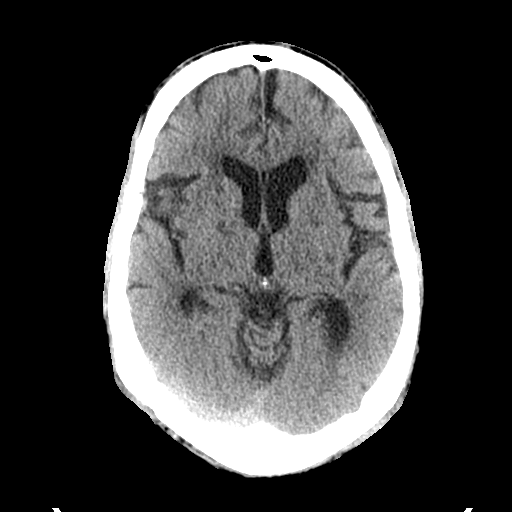
[im 19/36  brain]
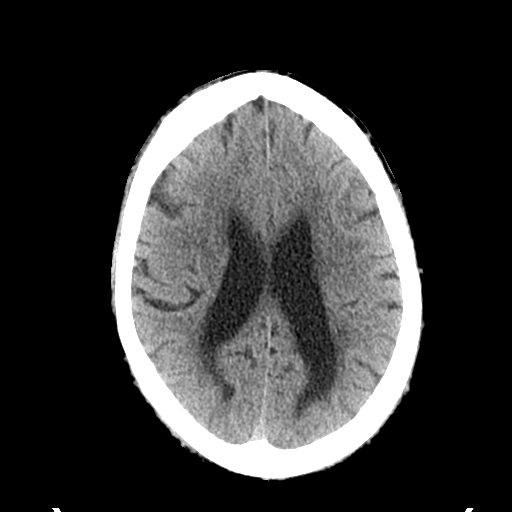
[im 19/36  bone]
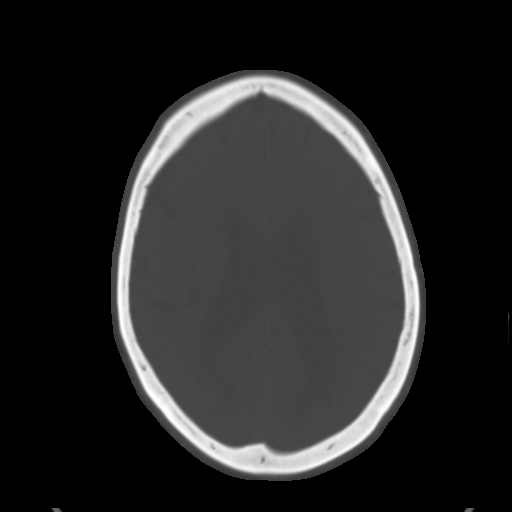
[im 22/36  brain]
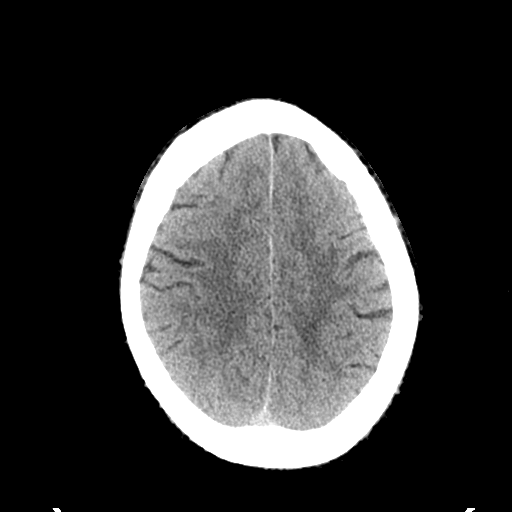
[im 26/36  brain]
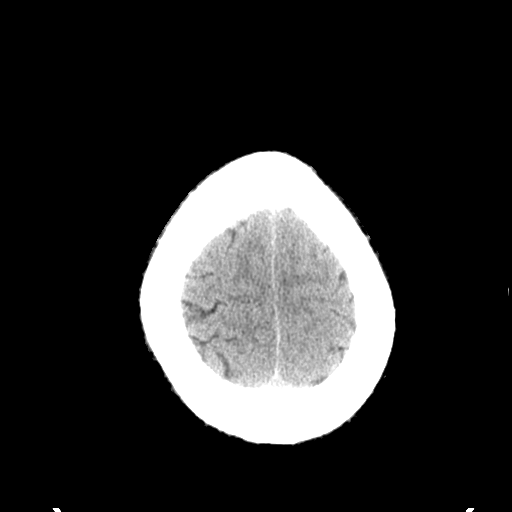
[im 29/36  brain]
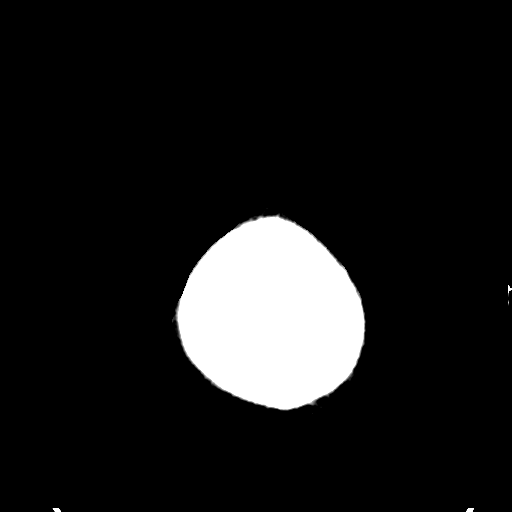
[im 33/36  brain]
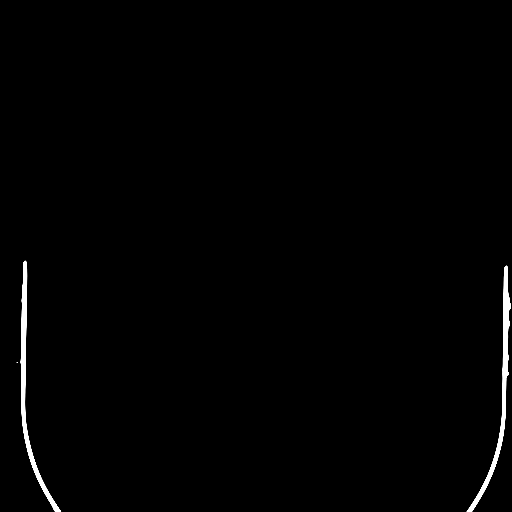
[im 33/36  bone]
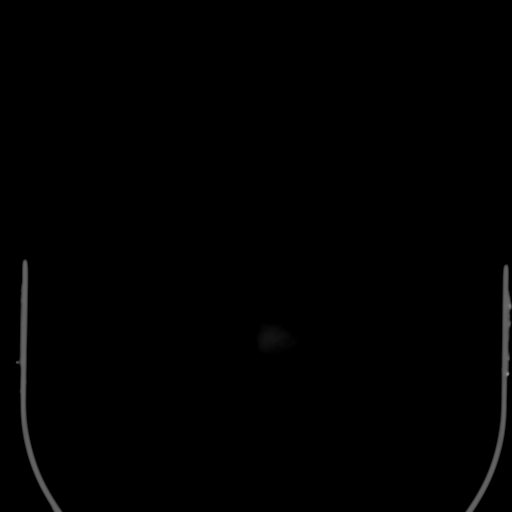

[Series 3: coronal soft tissue · coronal · 0.33mm/px · 3 of 71 slices shown]
[im 24/71  brain]
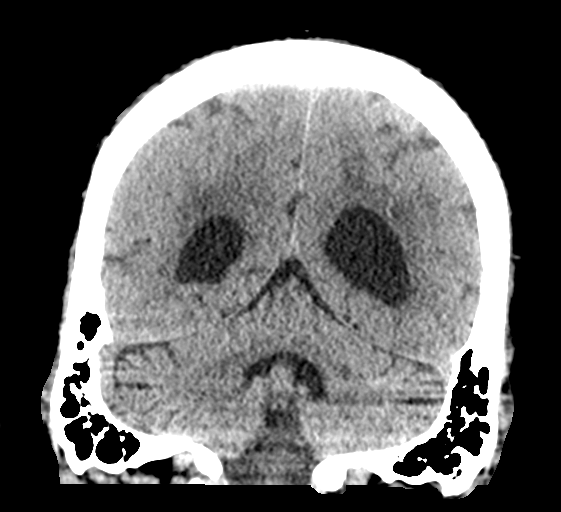
[im 32/71  brain]
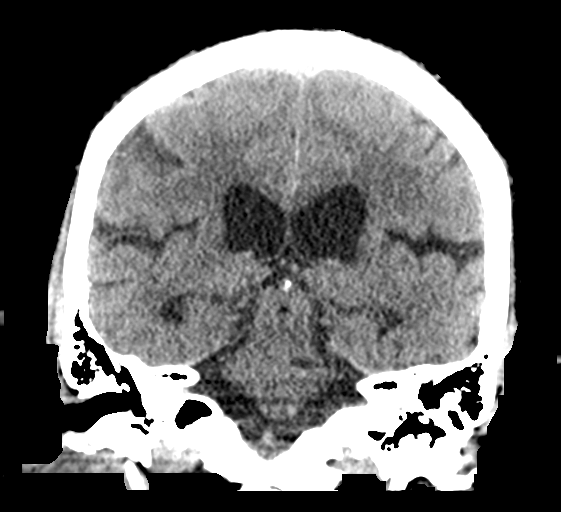
[im 39/71  brain]
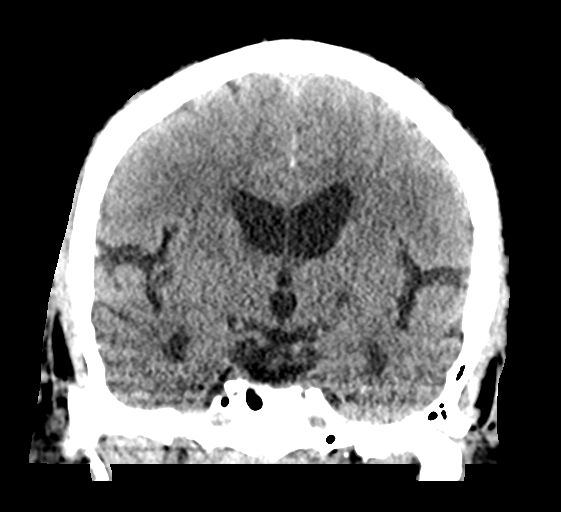

[Series 5: sagittal soft tissue · sagittal · 0.33mm/px · 3 of 61 slices shown]
[im 21/61  brain]
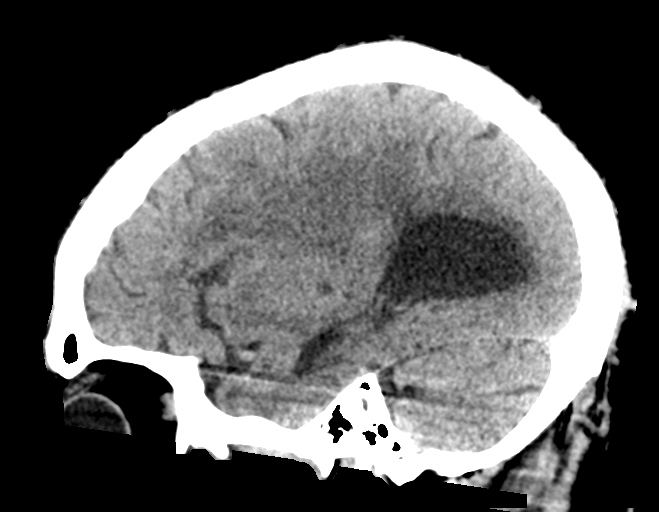
[im 31/61  brain]
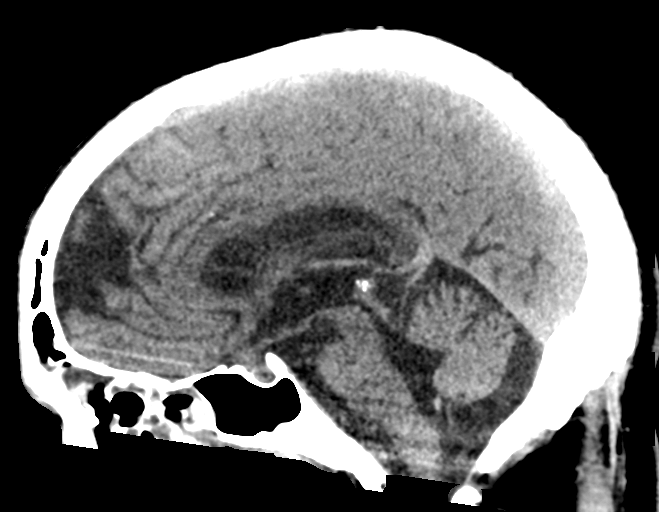
[im 41/61  brain]
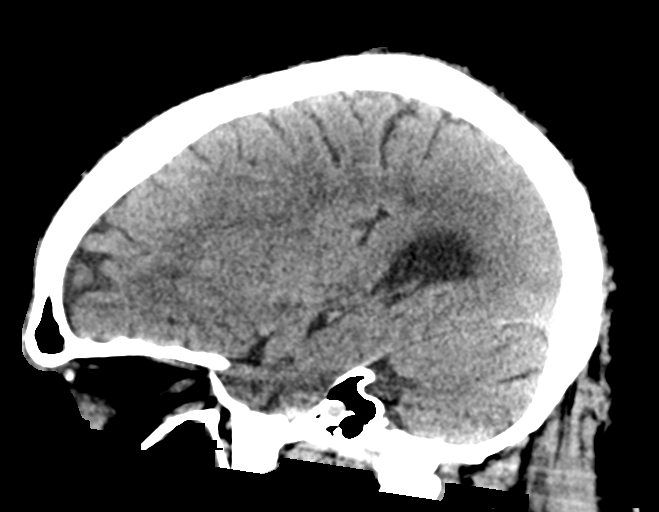

[15 of 47 positions shown; findings below may reference images not displayed]

FINDINGS: Brain: There is no mass, hemorrhage or extra-axial collection. There
is generalized atrophy without lobar predilection. Hypodensity of
the white matter is most commonly associated with chronic
microvascular disease. Multiple old basal ganglia small vessel
infarcts.

Vascular: No abnormal hyperdensity of the major intracranial
arteries or dural venous sinuses. No intracranial atherosclerosis.

Skull: The visualized skull base, calvarium and extracranial soft
tissues are normal.

Sinuses/Orbits: Moderate paranasal sinus mucosal thickening. The
orbits are normal.
IMPRESSION: 1. No acute intracranial abnormality.
2. Chronic microvascular ischemia and generalized atrophy.

Cerebral Atrophy ([8D]-[8D]).

## 2021-06-14 IMAGING — MR MR HEAD W/O CM
11 series · 48 of 48 positions shown · non-contrast
Comparison: Head CT [DATE]

CLINICAL DATA: Neuro deficit, acute, stroke suspected. Altered
mental status and weakness.

EXAM:
MRI HEAD WITHOUT CONTRAST
TECHNIQUE: Multiplanar, multiecho pulse sequences of the brain and surrounding
structures were obtained without intravenous contrast.

[Series 5: ax dwi_tracew · axial · 3.0mm · 0.71mm/px · z∈[-99,+58]mm · 6 of 56 slices shown]
[im 1/56]
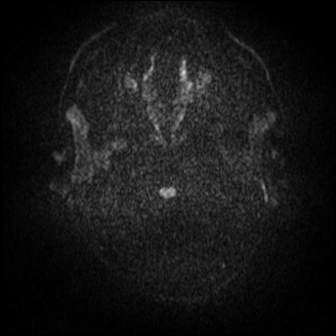
[im 12/56]
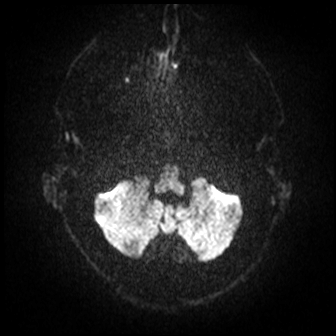
[im 23/56]
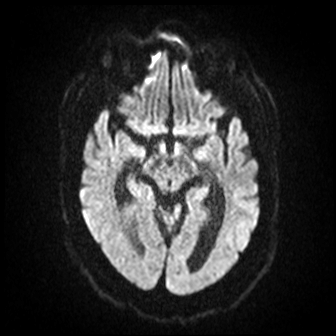
[im 34/56]
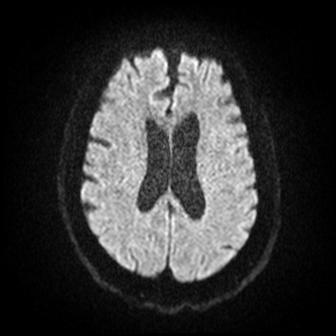
[im 45/56]
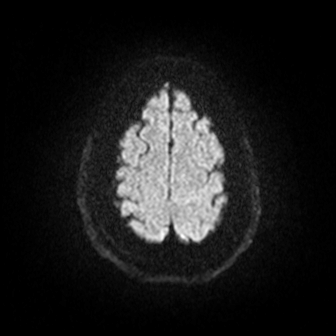
[im 56/56]
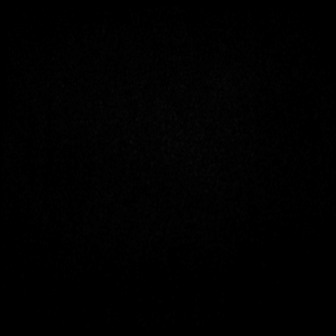

[Series 6: ax dwi_adc · axial · 3.0mm · 0.71mm/px · z∈[-99,+55]mm · 4 of 55 slices shown]
[im 1/55]
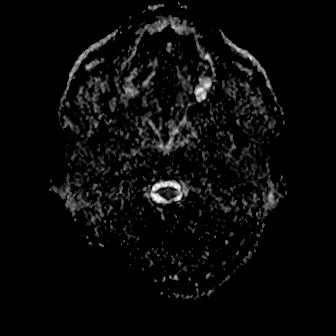
[im 19/55]
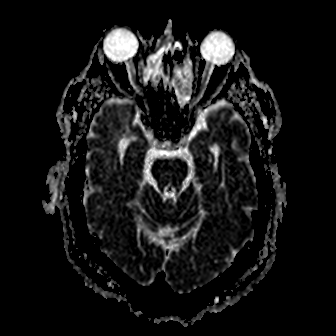
[im 37/55]
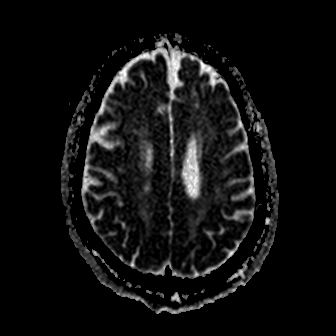
[im 55/55]
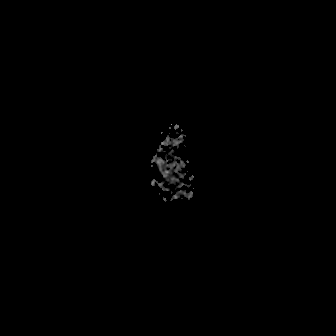

[Series 7: cor dwi_tracew · coronal · 5.0mm · 0.68mm/px · 3 of 40 slices shown]
[im 1/40]
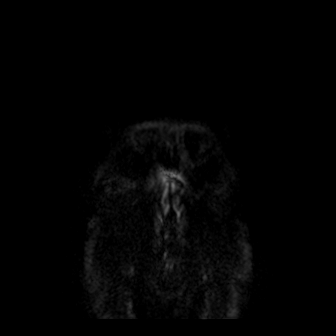
[im 20/40]
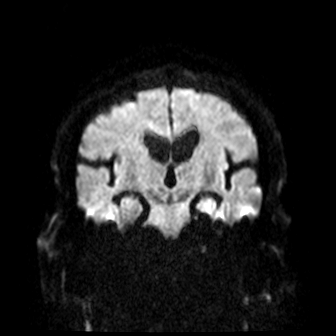
[im 40/40]
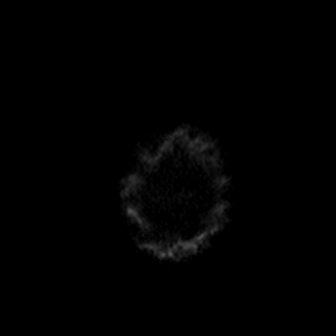

[Series 8: cor dwi_adc · coronal · 5.0mm · 0.68mm/px · 3 of 40 slices shown]
[im 1/40]
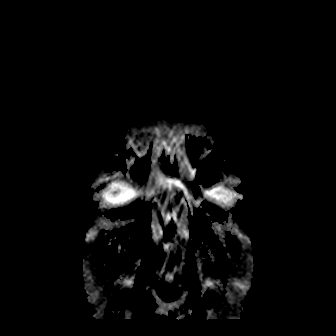
[im 20/40]
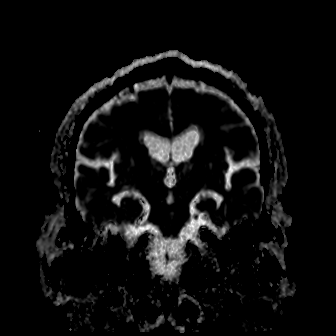
[im 40/40]
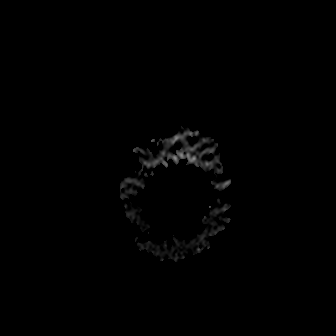

[Series 9: T1 · sagittal · 5.0mm · 0.47mm/px · 2 of 22 slices shown (1 of 2)]
[im 1/22]
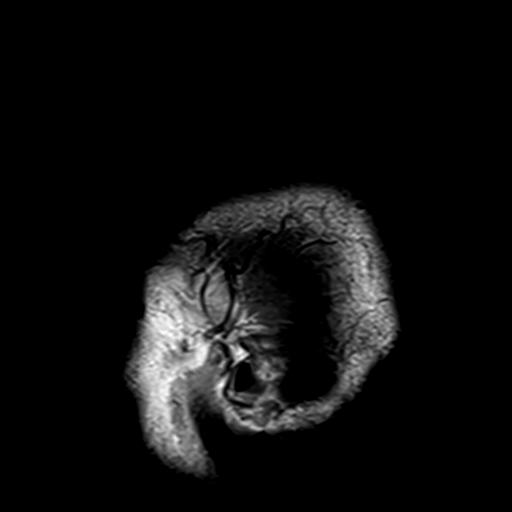
[im 22/22]
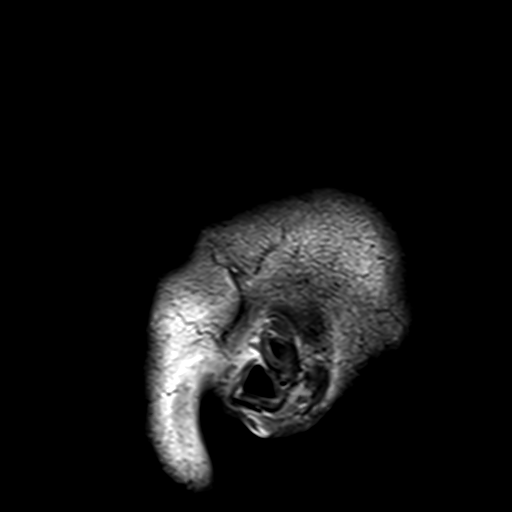

[Series 10: T2 · axial · 5.0mm · 0.86mm/px · z∈[-92,+57]mm · 2 of 27 slices shown (1 of 2)]
[im 1/27]
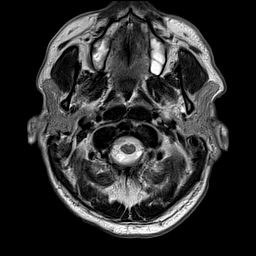
[im 27/27]
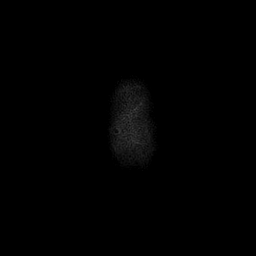

[Series 12: pha_images · axial · 3.0mm · 0.90mm/px · z∈[-98,+54]mm · 4 of 54 slices shown]
[im 1/54]
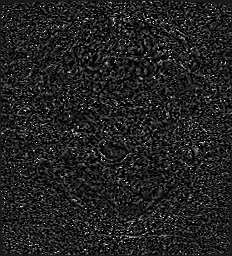
[im 18/54]
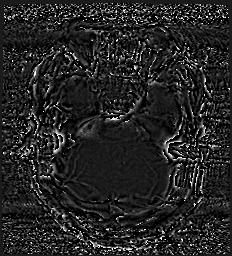
[im 36/54]
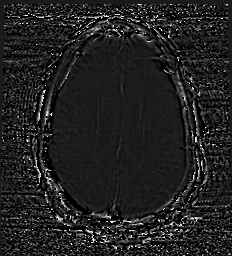
[im 54/54]
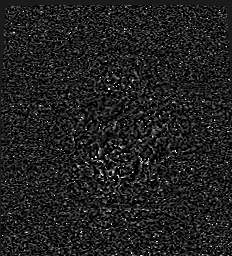

[Series 13: swi_images · axial · 3.0mm · 0.90mm/px · z∈[-98,+59]mm · 5 of 56 slices shown]
[im 1/56]
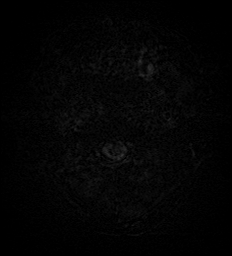
[im 14/56]
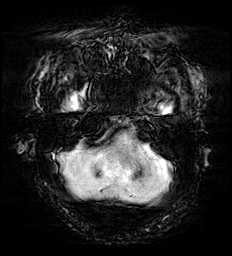
[im 28/56]
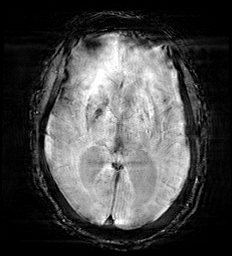
[im 42/56]
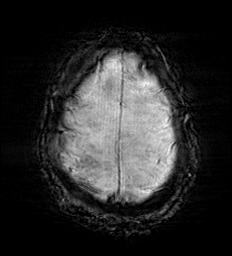
[im 56/56]
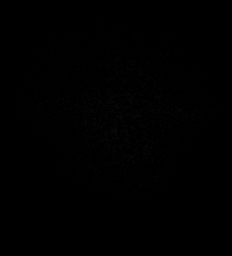

[Series 15: FLAIR · axial · 3.0mm · 0.69mm/px · z∈[-95,+59]mm · 4 of 55 slices shown]
[im 1/55]
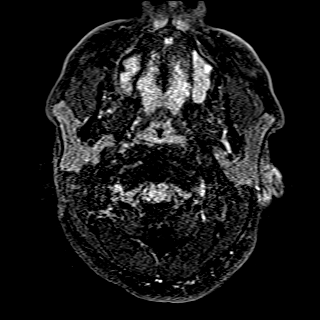
[im 19/55]
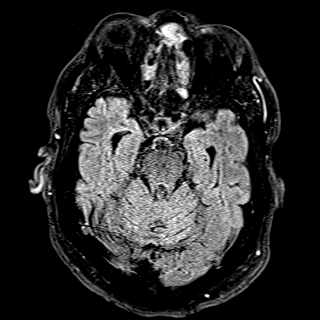
[im 37/55]
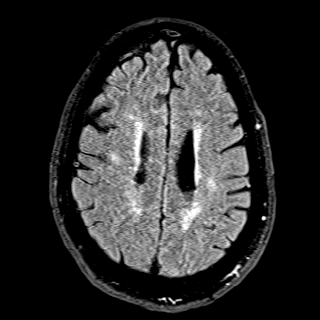
[im 55/55]
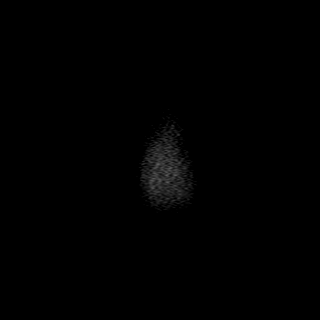

[Series 16: T1 · axial · 1.0mm · 0.98mm/px · z∈[-98,+53]mm · 13 of 160 slices shown (2 of 2)]
[im 1/160]
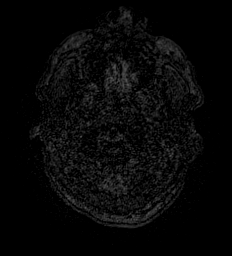
[im 14/160]
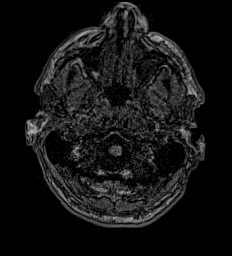
[im 27/160]
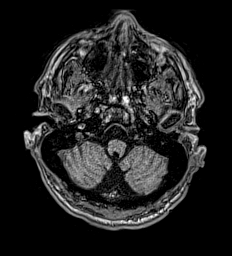
[im 40/160]
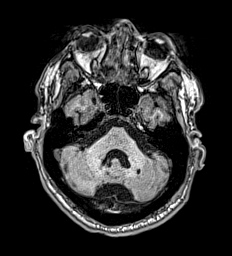
[im 54/160]
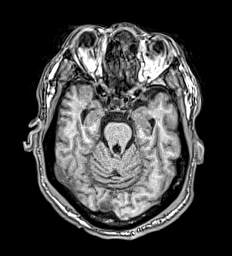
[im 67/160]
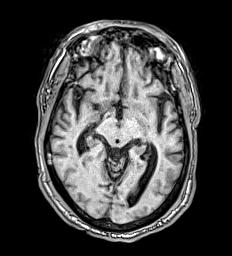
[im 80/160]
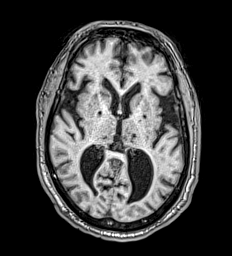
[im 93/160]
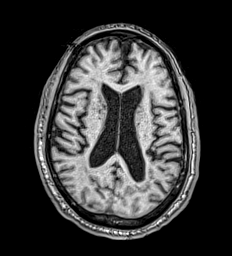
[im 107/160]
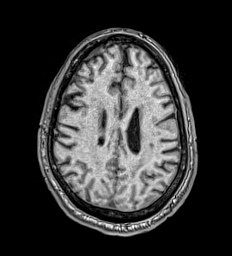
[im 120/160]
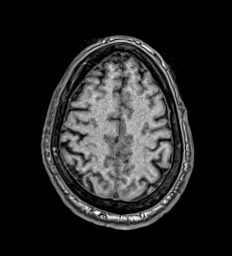
[im 133/160]
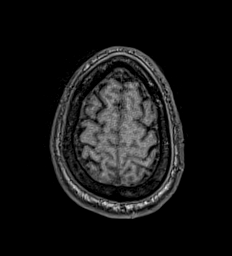
[im 146/160]
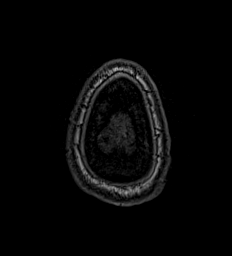
[im 160/160]
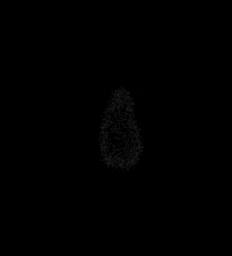

[Series 17: T2 · coronal · 5.0mm · 0.86mm/px · 2 of 30 slices shown (2 of 2)]
[im 1/30]
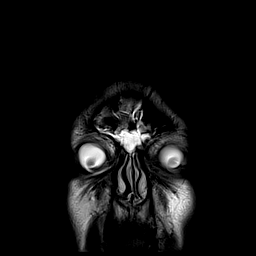
[im 30/30]
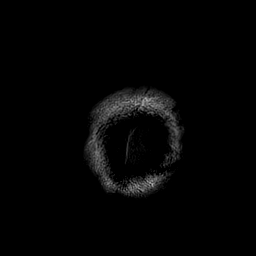

[48 of 48 positions shown; findings below may reference images not displayed]

FINDINGS: Brain: There is no evidence of an acute infarct, intracranial
hemorrhage, mass, midline shift, or extra-axial fluid collection.
Chronic lacunar infarcts are noted in the bilateral basal ganglia,
thalami, pons, and cerebellum. T2 hyperintensities in the cerebral
white matter bilaterally are nonspecific but compatible with
moderately age advanced chronic small vessel ischemic disease. There
is moderately advanced cerebral and cerebellar atrophy.

Vascular: Small and poorly visualized distal left vertebral artery,
likely congenitally hypoplastic with a superimposed proximal flow
limiting stenosis or occlusion also possible. Other major
intracranial vascular flow voids are preserved.

Skull and upper cervical spine: Diffusely diminished bone marrow T1
signal intensity, nonspecific but can be seen with anemia, smoking,
and obesity.

Sinuses/Orbits: Unremarkable orbits. Mucosal thickening throughout
the paranasal sinuses, most extensive in the ethmoid sinuses. Small
right mastoid effusion.

Other: None.
IMPRESSION: 1. No acute intracranial abnormality.
2. Age advanced atrophy and chronic small vessel ischemia with
multiple chronic lacunar infarcts.

## 2021-06-14 MED ORDER — DM-GUAIFENESIN ER 30-600 MG PO TB12
1.0000 | ORAL_TABLET | Freq: Two times a day (BID) | ORAL | Status: DC | PRN
  Filled 2021-06-14: qty 1

## 2021-06-14 MED ORDER — AZITHROMYCIN 500 MG PO TABS
500.0000 mg | ORAL_TABLET | Freq: Once | ORAL | Status: AC
  Administered 2021-06-14: 500 mg via ORAL
  Filled 2021-06-14: qty 1

## 2021-06-14 MED ORDER — BEMPEDOIC ACID-EZETIMIBE 180-10 MG PO TABS: 1.0000 | ORAL_TABLET | Freq: Every day | ORAL | Status: DC

## 2021-06-14 MED ORDER — POTASSIUM CHLORIDE CRYS ER 20 MEQ PO TBCR
40.0000 meq | EXTENDED_RELEASE_TABLET | Freq: Once | ORAL | Status: DC
  Filled 2021-06-14: qty 2

## 2021-06-14 MED ORDER — LORAZEPAM 2 MG/ML IJ SOLN
0.0000 mg | Freq: Four times a day (QID) | INTRAMUSCULAR | Status: DC
Start: 2021-06-14 — End: 2021-06-14

## 2021-06-14 MED ORDER — TAMSULOSIN HCL 0.4 MG PO CAPS
0.4000 mg | ORAL_CAPSULE | Freq: Every day | ORAL | Status: DC
  Administered 2021-06-14 – 2021-06-16 (×3): 0.4 mg via ORAL
  Filled 2021-06-14 (×3): qty 1

## 2021-06-14 MED ORDER — ADULT MULTIVITAMIN W/MINERALS CH
1.0000 | ORAL_TABLET | Freq: Every day | ORAL | Status: DC
  Administered 2021-06-15 – 2021-06-16 (×2): 1 via ORAL
  Filled 2021-06-14 (×3): qty 1

## 2021-06-14 MED ORDER — CITALOPRAM HYDROBROMIDE 10 MG PO TABS
10.0000 mg | ORAL_TABLET | Freq: Every day | ORAL | Status: DC
  Administered 2021-06-14 – 2021-06-16 (×3): 10 mg via ORAL
  Filled 2021-06-14 (×3): qty 1

## 2021-06-14 MED ORDER — ASCORBIC ACID 500 MG PO TABS
250.0000 mg | ORAL_TABLET | Freq: Two times a day (BID) | ORAL | Status: DC
  Administered 2021-06-14 – 2021-06-16 (×5): 250 mg via ORAL
  Filled 2021-06-14 (×5): qty 1

## 2021-06-14 MED ORDER — AZITHROMYCIN 250 MG PO TABS: ORAL_TABLET | ORAL | 0 refills | Status: DC

## 2021-06-14 MED ORDER — HYDRALAZINE HCL 20 MG/ML IJ SOLN: 5.0000 mg | INTRAMUSCULAR | Status: DC | PRN

## 2021-06-14 MED ORDER — AMOXICILLIN-POT CLAVULANATE 875-125 MG PO TABS: 1.0000 | ORAL_TABLET | Freq: Two times a day (BID) | ORAL | 0 refills | Status: DC

## 2021-06-14 MED ORDER — LORAZEPAM 2 MG/ML IJ SOLN: 1.0000 mg | INTRAMUSCULAR | Status: DC | PRN

## 2021-06-14 MED ORDER — INSULIN ASPART 100 UNIT/ML IJ SOLN
0.0000 [IU] | Freq: Three times a day (TID) | INTRAMUSCULAR | Status: DC
Start: 2021-06-14 — End: 2021-06-16
  Administered 2021-06-15 – 2021-06-16 (×2): 1 [IU] via SUBCUTANEOUS
  Filled 2021-06-14 (×2): qty 1

## 2021-06-14 MED ORDER — OLANZAPINE 5 MG PO TABS
5.0000 mg | ORAL_TABLET | Freq: Every day | ORAL | Status: DC
  Administered 2021-06-14 – 2021-06-15 (×2): 5 mg via ORAL
  Filled 2021-06-14 (×2): qty 1

## 2021-06-14 MED ORDER — THIAMINE HCL 100 MG PO TABS
100.0000 mg | ORAL_TABLET | Freq: Every day | ORAL | Status: DC
  Administered 2021-06-16: 100 mg via ORAL
  Filled 2021-06-14 (×3): qty 1

## 2021-06-14 MED ORDER — EZETIMIBE 10 MG PO TABS
10.0000 mg | ORAL_TABLET | Freq: Every day | ORAL | Status: DC
  Administered 2021-06-14 – 2021-06-16 (×3): 10 mg via ORAL
  Filled 2021-06-14 (×3): qty 1

## 2021-06-14 MED ORDER — THIAMINE HCL 100 MG/ML IJ SOLN
100.0000 mg | Freq: Every day | INTRAMUSCULAR | Status: DC
  Administered 2021-06-15: 100 mg via INTRAVENOUS
  Filled 2021-06-14 (×2): qty 2

## 2021-06-14 MED ORDER — CLONIDINE HCL 0.2 MG/24HR TD PTWK
0.2000 mg | MEDICATED_PATCH | TRANSDERMAL | Status: DC
  Administered 2021-06-14: 0.2 mg via TRANSDERMAL
  Filled 2021-06-14: qty 1

## 2021-06-14 MED ORDER — SODIUM CHLORIDE 0.9 % IV SOLN
1.0000 g | Freq: Once | INTRAVENOUS | Status: AC
  Administered 2021-06-14: 1 g via INTRAVENOUS
  Filled 2021-06-14: qty 10

## 2021-06-14 MED ORDER — ONDANSETRON HCL 4 MG/2ML IJ SOLN: 4.0000 mg | Freq: Three times a day (TID) | INTRAMUSCULAR | Status: DC | PRN

## 2021-06-14 MED ORDER — LORAZEPAM 2 MG/ML IJ SOLN: 0.0000 mg | Freq: Two times a day (BID) | INTRAMUSCULAR | Status: DC

## 2021-06-14 MED ORDER — ALBUTEROL SULFATE (2.5 MG/3ML) 0.083% IN NEBU: 2.5000 mg | INHALATION_SOLUTION | RESPIRATORY_TRACT | Status: DC | PRN

## 2021-06-14 MED ORDER — SODIUM CHLORIDE 0.9 % IV BOLUS
1000.0000 mL | Freq: Once | INTRAVENOUS | Status: AC
  Administered 2021-06-14: 1000 mL via INTRAVENOUS

## 2021-06-14 MED ORDER — ACETAMINOPHEN 325 MG PO TABS: 650.0000 mg | ORAL_TABLET | Freq: Four times a day (QID) | ORAL | Status: DC | PRN

## 2021-06-14 MED ORDER — ENOXAPARIN SODIUM 40 MG/0.4ML IJ SOSY
40.0000 mg | PREFILLED_SYRINGE | INTRAMUSCULAR | Status: DC
  Administered 2021-06-14 – 2021-06-15 (×2): 40 mg via SUBCUTANEOUS
  Filled 2021-06-14 (×2): qty 0.4

## 2021-06-14 MED ORDER — AMLODIPINE BESYLATE 10 MG PO TABS
10.0000 mg | ORAL_TABLET | Freq: Every day | ORAL | Status: DC
  Administered 2021-06-14 – 2021-06-16 (×3): 10 mg via ORAL
  Filled 2021-06-14: qty 2
  Filled 2021-06-14 (×2): qty 1

## 2021-06-14 MED ORDER — FOLIC ACID 1 MG PO TABS
1.0000 mg | ORAL_TABLET | Freq: Every day | ORAL | Status: DC
  Administered 2021-06-15 – 2021-06-16 (×2): 1 mg via ORAL
  Filled 2021-06-14 (×3): qty 1

## 2021-06-14 MED ORDER — LORAZEPAM 1 MG PO TABS: 1.0000 mg | ORAL_TABLET | ORAL | Status: DC | PRN

## 2021-06-14 MED ORDER — INSULIN ASPART 100 UNIT/ML IJ SOLN: 0.0000 [IU] | Freq: Every day | INTRAMUSCULAR | Status: DC

## 2021-06-14 NOTE — ED Provider Notes (Signed)
[facility] Emergency Department Provider Note  ____________________________________________  Time seen: Approximately 4:42 AM  I have reviewed the triage vital signs and the nursing notes.   HISTORY  Chief Complaint Altered Mental Status   HPI Martin Diaz is a 60 y.o. male with a history of alcohol abuse, kidney disease, depression who presents from a group home for generalized weakness and altered mental status.  Patient has had a cough and congestion for the last few days.  Today he could not get out of bed and ambulate without any assistance.  Patient was moaning and groaning at the group home, not eating or drinking.  He has had a very productive cough.  No known documented fever.  Patient denies chest pain or shortness of breath.  He is complaining of a mild throbbing headache for the last few days.  He denies abdominal pain, vomiting or diarrhea.   Past Medical History:  Diagnosis Date   Alcohol use    Depression    Dysphagia    Kidney disease    Pancreatitis    Rhabdomyolysis     Patient Active Problem List   Diagnosis Date Noted   Metabolic acidosis    Non-traumatic rhabdomyolysis    Acute renal failure (ARF) ([place]) 06/10/2019   Pancreatitis, alcoholic, acute 19/50/9326   Alcohol use 06/10/2019   Hypocalcemia 06/10/2019    No past surgical history on file.  Prior to Admission medications   Medication Sig Start Date End Date Taking? Authorizing Provider  amLODipine (NORVASC) 10 MG tablet Take 1 tablet (10 mg total) by mouth daily. 06/28/19  Yes Nicole Kindred A, DO  amoxicillin-clavulanate (AUGMENTIN) 875-125 MG tablet Take 1 tablet by mouth 2 (two) times daily for 10 days. 06/14/21 06/24/21 Yes Mariluz Crespo, [place], MD  azithromycin [facility]) 250 MG tablet Take 1 a day for 4 days 06/14/21  Yes Alfred Levins, [place], MD  citalopram (CELEXA) 10 MG tablet Take 10 mg by mouth daily. 05/19/21  Yes [provider]  cloNIDine (CATAPRES  - DOSED IN MG/24 HR) 0.2 mg/24hr patch Place 1 patch (0.2 mg total) onto the skin once a week. 07/01/19  Yes Nicole Kindred A, DO  feeding supplement, ENSURE ENLIVE, (ENSURE ENLIVE) LIQD Take 237 mLs by mouth 2 (two) times daily between meals. 06/27/19  Yes Ezekiel Slocumb, DO  folic acid (FOLVITE) 1 MG tablet Take 1 tablet (1 mg total) by mouth daily. 06/28/19  Yes Nicole Kindred A, DO  hydrALAZINE (APRESOLINE) 50 MG tablet Take 1 tablet (50 mg total) by mouth every 8 (eight) hours. 06/27/19  Yes Nicole Kindred A, DO  JARDIANCE 10 MG TABS tablet Take 10 mg by mouth daily. 05/19/21  Yes [provider]  megestrol (MEGACE) 400 MG/10ML suspension Take 10 mLs (400 mg total) by mouth 2 (two) times daily. 06/27/19  Yes Nicole Kindred A, DO  metoprolol tartrate (LOPRESSOR) 25 MG tablet Take 1 tablet (25 mg total) by mouth 2 (two) times daily. 06/27/19  Yes Ezekiel Slocumb, DO  Multiple Vitamin (MULTIVITAMIN WITH MINERALS) TABS tablet Take 1 tablet by mouth daily. 06/28/19  Yes Nicole Kindred A, DO  NEXLIZET 180-10 MG TABS Take 1 tablet by mouth daily. 06/05/21  Yes [provider]  OLANZapine (ZYPREXA) 5 MG tablet Take 5 mg by mouth at bedtime. 05/19/21  Yes [provider]  tamsulosin (FLOMAX) 0.4 MG CAPS capsule Take 0.4 mg by mouth daily. 06/11/21  Yes [provider]  thiamine 100 MG tablet Take 1 tablet (  100 mg total) by mouth daily. 06/28/19  Yes Nicole Kindred A, DO  vitamin C (VITAMIN C) 250 MG tablet Take 1 tablet (250 mg total) by mouth 2 (two) times daily. 06/27/19  Yes Ezekiel Slocumb, DO    Allergies Patient has no known allergies.  No family history on file.  Social History Social History   Tobacco Use   Smoking status: Never   Smokeless tobacco: Never  Substance Use Topics   Alcohol use: Yes    Comment: says he hasn't been drinking   Drug use: Not Currently    Review of Systems  Constitutional: Negative for fever. + generalized  weakness Eyes: Negative for visual changes. ENT: Negative for sore throat. + congestion Neck: No neck pain  Cardiovascular: Negative for chest pain. Respiratory: Negative for shortness of breath. + cough Gastrointestinal: Negative for abdominal pain, vomiting or diarrhea. Genitourinary: Negative for dysuria. Musculoskeletal: Negative for back pain. Skin: Negative for rash. Neurological: Negative for headaches, weakness or numbness. Psych: No SI or HI  ____________________________________________   PHYSICAL EXAM:  VITAL SIGNS: ED Triage Vitals [06/13/21 1400]  Enc Vitals Group     BP 114/65     Pulse Rate 65     Resp 20     Temp 98.5 F (36.9 C)     Temp Source Oral     SpO2 96 %     Weight 210 lb (95.3 kg)     Height 6' (1.829 m)     Head Circumference      Peak Flow      Pain Score      Pain Loc      Pain Edu?      Excl. in [place]?     Constitutional: Alert and oriented. Well appearing and in no apparent distress. HEENT:      Head: Normocephalic and atraumatic.         Eyes: Conjunctivae are normal. Sclera is non-icteric.       Mouth/Throat: Mucous membranes are moist.       Neck: Supple with no signs of meningismus. Cardiovascular: Regular rate and rhythm. No murmurs, gallops, or rubs. 2+ symmetrical distal pulses are present in all extremities. No JVD. Respiratory: Normal respiratory effort. Lungs are clear to auscultation bilaterally.  Gastrointestinal: Soft, non tender. Musculoskeletal:  No edema, cyanosis, or erythema of extremities. Neurologic: Speech is slurred, moving all 4 extremities, face is symmetric Skin: Skin is warm, dry and intact. No rash noted. Psychiatric: Mood and affect are normal. Speech and behavior are normal.  ____________________________________________   LABS (all labs ordered are listed, but only abnormal results are displayed)  Labs Reviewed  COMPREHENSIVE METABOLIC PANEL - Abnormal; Notable for the following components:      Result  Value   Potassium 3.4 (*)    Glucose, Bld 106 (*)    Calcium 8.6 (*)    All other components within normal limits  CBC - Abnormal; Notable for the following components:   WBC 3.5 (*)    Hemoglobin 12.8 (*)    All other components within normal limits  URINALYSIS, COMPLETE (UACMP) WITH MICROSCOPIC - Abnormal; Notable for the following components:   Color, Urine YELLOW (*)    APPearance CLEAR (*)    Glucose, UA >=500 (*)    Ketones, ur 5 (*)    All other components within normal limits  RESP PANEL BY RT-PCR (FLU A&B, COVID) ARPGX2  BRAIN NATRIURETIC PEPTIDE  PROCALCITONIN  CBG MONITORING, ED  TROPONIN I (  HIGH SENSITIVITY)   ____________________________________________  EKG  ED ECG REPORT I, Rudene Re, the attending physician, personally viewed and interpreted this ECG.  Sinus rhythm with first-degree AV block, normal QTC, normal axis, no ST elevations or depressions ____________________________________________  RADIOLOGY  I have personally reviewed the images performed during this visit and I agree with the Radiologist's read.   Interpretation by Radiologist:  DG Chest 1 View  Result Date: 06/13/2021 CLINICAL DATA:  Altered mental status. EXAM: CHEST  1 VIEW COMPARISON:  June 12, 2019 FINDINGS: Enlarged cardiac silhouette.  Mediastinal contours appear intact. No lobar airspace consolidation. Diffuse prominence of the interstitium. Osseous structures are without acute abnormality. Soft tissues are grossly normal. IMPRESSION: 1. Enlarged cardiac silhouette. 2. Diffuse prominence of the interstitium may represent interstitial pulmonary edema or atypical infection. Electronically Signed   By: Fidela Salisbury M.D.   On: 06/13/2021 18:32   CT HEAD WO CONTRAST (5MM)  Result Date: 06/14/2021 CLINICAL DATA:  Encephalopathy EXAM: CT HEAD WITHOUT CONTRAST TECHNIQUE: Contiguous axial images were obtained from the base of the skull through the vertex without intravenous  contrast. COMPARISON:  None. FINDINGS: Brain: There is no mass, hemorrhage or extra-axial collection. There is generalized atrophy without lobar predilection. Hypodensity of the white matter is most commonly associated with chronic microvascular disease. 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 Vascular: No abnormal hyperdensity of the major intracranial arteries or dural venous sinuses. No intracranial atherosclerosis. Skull: The visualized skull base, calvarium and extracranial soft tissues are normal. Sinuses/Orbits: Moderate paranasal sinus mucosal thickening. The orbits are normal. IMPRESSION: 1. No acute intracranial abnormality. 2. Chronic microvascular ischemia and generalized atrophy. Cerebral Atrophy (ICD10-G31.9). Electronically Signed   By: Ulyses Jarred M.D.   On: 06/14/2021 03:06     ____________________________________________   PROCEDURES  Procedure(s) performed: yes .1-3 Lead EKG Interpretation Performed by: Rudene Re, MD Authorized by: Rudene Re, MD     Interpretation: non-specific     ECG rate assessment: normal     Rhythm: sinus rhythm     Ectopy: none     Conduction: abnormal     Critical Care performed:  None ____________________________________________   INITIAL IMPRESSION / ASSESSMENT AND PLAN / ED COURSE  60 y.o. male with a history of alcohol abuse, kidney disease, depression who presents from a group home for generalized weakness, cough, congestion.  Patient is in no respiratory distress, though does have a productive cough, lung sounds clear to auscultation with no wheezing or crackles.  He is grossly neurologically intact, moving all 4 extremities, face is symmetric.  Speech is slightly garbled but unknown what his baseline is.  Patient unable to ambulate without assistance of 2 staff personnel due to generalized weakness.  Head CT is unremarkable.  Chest x-ray concerning for atypical pneumonia.  COVID and flu negative.  No sepsis or  respiratory distress.  UA with no signs of UTI.  Chemistry panel no significant electrolyte derangements.  BNP and troponin are within normal limits.  We will treat patient with Rocephin and azithromycin for atypical infection.  We will try to contact the group home to determine if patient is at baseline.  Patient is on telemetry for close monitoring of cardiorespiratory status.  Old medical records reviewed   _________________________ 6:40 AM on 06/14/2021 ----------------------------------------- Several attempts and messages were left at patient's group home answering machine and also in the answering machine of patient's legal guardian.  We have not been able to reach anyone yet.  We will continue to  hold his disposition until we know if this is patient's baseline.  With no oxygen requirement, no sepsis there is no need for admission unless patient is not at baseline.       _____________________________________________ Please note:  Patient was evaluated in Emergency Department today for the symptoms described in the history of present illness. Patient was evaluated in the context of the global COVID-19 pandemic, which necessitated consideration that the patient might be at risk for infection with the SARS-CoV-2 virus that causes COVID-19. Institutional protocols and algorithms that pertain to the evaluation of patients at risk for COVID-19 are in a state of rapid change based on information released by regulatory bodies including the CDC and federal and state organizations. These policies and algorithms were followed during the patient's care in the ED.  Some ED evaluations and interventions may be delayed as a result of limited staffing during the pandemic.   [place] Controlled Substance Database was reviewed by me. ____________________________________________   FINAL CLINICAL IMPRESSION(S) / ED DIAGNOSES   Final diagnoses:  Community acquired pneumonia, unspecified laterality  Generalized  weakness      NEW MEDICATIONS STARTED DURING THIS VISIT:  ED Discharge Orders          Ordered    amoxicillin-clavulanate (AUGMENTIN) 875-125 MG tablet  2 times daily        06/14/21 0641    azithromycin (ZITHROMAX) 250 MG tablet        06/14/21 0100             Note:  This document was prepared using Dragon voice recognition software and may include unintentional dictation errors.    Rudene Re, MD 06/14/21 [phone]

## 2021-06-14 NOTE — ED Notes (Signed)
Pt taken for MRI.

## 2021-06-14 NOTE — ED Provider Notes (Signed)
I assumed care of this patient approximate 0 700.  Please see (note for full details going patient's initial valuation cyst.  In brief patient presents for assessment of some generalized weakness and altered mental status although ongoing provider unable to reach staff at group home with family to obtain baseline neurological status.  Patient has reported a cough and congestion of the last couple days initially with concern for possible pneumonia although patient has no fever, leukocytosis or focal consolidation on x-ray.  Plan is to follow-up with family or group home and patient is in medical baseline likely discharge versus if not admit she seems to require significant assistance with ambulating.  I was able to eventually reach his aunt to confirm the patient is able to typically ambulate without difficulty on his own and has some developmental delays and sometimes will have some speech difficulties but otherwise not typically confused.  On my assessment patient is not complaining of any pain and has a little bit of garbled speech but does not seem aphasic or acutely intoxicated and unsure if this is baseline.  On does not very helpful in this regard.  However given he is now unable to walk unassisted without clear etiology will admit to medicine service for further evaluation management.  I did also obtain a pelvis x-ray showed no acute fracture dislocation and I placed an order for an MRI.  Patient admitted in stable condition to medicine service for further evaluation and management.   Lucrezia Starch, MD 06/14/21 [phone]

## 2021-06-14 NOTE — ED Notes (Signed)
Pt requesting to lay back down and would like to take the rest of his pills later

## 2021-06-14 NOTE — ED Notes (Signed)
Pt placed on cardiac monitoring.  

## 2021-06-14 NOTE — ED Notes (Signed)
Curley Spice [phone] for Mr. Maul found a note in triage

## 2021-06-14 NOTE — H&P (Signed)
History and Physical    Kreg Earhart JJO:[id number] DOB: 27-Oct-1960 DOA: 06/14/2021  Referring MD/NP/PA:   PCP: Patient, No Pcp Per (Inactive)   Patient coming from:  The patient is coming from group home.    Chief Complaint: AMS and weakness  HPI: Abdur Hoglund is a 60 y.o. male with medical history significant of intellectual disability, hypertension, hyperlipidemia, possible diabetes mellitus (patient is on Jardiance), depression, pancreatitis, rhabdomyolysis, alcohol abuse (stopped drinking alcohol for a long time), dysphagia, who presents with altered mental status and weakness.  Pt is from group home. Patient has AMS and hx of intellectual disability, and is unable to provide accurate medical history, therefore, most of the history is obtained by discussing the case with ED physician, per EMS report, and with the nursing staff. I also called her caregiver in facility, who provided some history, but still history is limited.  Per his caregiver, patient normally knows his own name, interacting with people, knows the place where he is living, but often confused about time.  Patient is normally talking and able to walk.  Today patient was found to have weakness, less interactive, not walking, very lethargic.  Per his caregiver, his mental status is worse than baseline.  Patient was noted to have dry cough, runny nose and congestion in the past several days, does not seem to have respiratory distress.  Patient denies chest pain or abdominal pain.  No active nausea, vomiting, diarrhea noted.  Patient moves all extremities.  No facial droop or slurred speech.   ED Course: pt was found to have WBC 3.5, troponin level 7, procalcitonin level <0.10, negative urinalysis, negative COVID PCR, BNP level 50.4, potassium 3.4, renal function okay, temperature normal, blood pressure 127/60, heart rate 59, RR 17, oxygen saturation 96% on room air.  CT of head is negative for acute intracranial  abnormalities.  Chest x-ray negative.  X-ray of her pelvis is negative.  Patient is placed on MedSurg bed for observation.  Review of Systems: Could not be reviewed accurately due to altered mental status and intellectual disability.   Allergy: No Known Allergies  Past Medical History:  Diagnosis Date   Alcohol use    Depression    Dysphagia    Intellectual disability    Kidney disease    Pancreatitis    Rhabdomyolysis     No past surgical history on file. Could not be reviewed accurately due to altered mental status and intellectual disability.  Social History:  reports that he has never smoked. He has never used smokeless tobacco. He reports current alcohol use. He reports that he does not currently use drugs.  Family History: No family history on file.  Could not be reviewed accurately due to altered mental status and intellectual disability.  Prior to Admission medications   Medication Sig Start Date End Date Taking? Authorizing Provider  amLODipine (NORVASC) 10 MG tablet Take 1 tablet (10 mg total) by mouth daily. 06/28/19  Yes Nicole Kindred A, DO  amoxicillin-clavulanate (AUGMENTIN) 875-125 MG tablet Take 1 tablet by mouth 2 (two) times daily for 10 days. 06/14/21 06/24/21 Yes [place], [place], MD  azithromycin [facility]) 250 MG tablet Take 1 a day for 4 days 06/14/21  Yes Alfred Levins, [place], MD  citalopram (CELEXA) 10 MG tablet Take 10 mg by mouth daily. 05/19/21  Yes [provider]  cloNIDine (CATAPRES - DOSED IN MG/24 HR) 0.2 mg/24hr patch Place 1 patch (0.2 mg total) onto the skin once a week. 07/01/19  Yes Arbutus Ped,  Kelly A, DO  feeding supplement, ENSURE ENLIVE, (ENSURE ENLIVE) LIQD Take 237 mLs by mouth 2 (two) times daily between meals. 06/27/19  Yes Ezekiel Slocumb, DO  folic acid (FOLVITE) 1 MG tablet Take 1 tablet (1 mg total) by mouth daily. 06/28/19  Yes Nicole Kindred A, DO  hydrALAZINE (APRESOLINE) 50 MG tablet Take 1 tablet (50 mg total) by mouth  every 8 (eight) hours. 06/27/19  Yes Nicole Kindred A, DO  JARDIANCE 10 MG TABS tablet Take 10 mg by mouth daily. 05/19/21  Yes [provider]  megestrol (MEGACE) 400 MG/10ML suspension Take 10 mLs (400 mg total) by mouth 2 (two) times daily. 06/27/19  Yes Nicole Kindred A, DO  metoprolol tartrate (LOPRESSOR) 25 MG tablet Take 1 tablet (25 mg total) by mouth 2 (two) times daily. 06/27/19  Yes Ezekiel Slocumb, DO  Multiple Vitamin (MULTIVITAMIN WITH MINERALS) TABS tablet Take 1 tablet by mouth daily. 06/28/19  Yes Nicole Kindred A, DO  NEXLIZET 180-10 MG TABS Take 1 tablet by mouth daily. 06/05/21  Yes [provider]  OLANZapine (ZYPREXA) 5 MG tablet Take 5 mg by mouth at bedtime. 05/19/21  Yes [provider]  tamsulosin (FLOMAX) 0.4 MG CAPS capsule Take 0.4 mg by mouth daily. 06/11/21  Yes [provider]  thiamine 100 MG tablet Take 1 tablet (100 mg total) by mouth daily. 06/28/19  Yes Nicole Kindred A, DO  vitamin C (VITAMIN C) 250 MG tablet Take 1 tablet (250 mg total) by mouth 2 (two) times daily. 06/27/19  Yes Ezekiel Slocumb, DO    Physical Exam: Vitals:   06/14/21 1000 06/14/21 1030 06/14/21 1100 06/14/21 1129  BP: 132/66 127/61 (!) 122/58 (!) 122/58  Pulse: 79 75 73 73  Resp: 16 14 13    Temp:      TempSrc:      SpO2: 96% 96% 97%   Weight:      Height:       General: Not in acute distress HEENT:       Eyes: PERRL, EOMI, no scleral icterus.       ENT: No discharge from the ears and nose, no pharynx injection, no tonsillar enlargement.        Neck: No JVD, no bruit, no mass felt. Heme: No neck lymph node enlargement. Cardiac: S1/S2, RRR, No murmurs, No gallops or rubs. Respiratory: No rales, wheezing, rhonchi or rubs. GI: Soft, nondistended, nontender, no organomegaly, BS present. GU: No hematuria Ext: No pitting leg edema bilaterally. 1+DP/PT pulse bilaterally. Musculoskeletal: No joint deformities, No joint redness or warmth, no  limitation of ROM in spin. Skin: No rashes.  Neuro: Alert, knows his own name, not oriented to time and place, cranial nerves II-XII grossly intact, moves all extremities. Psych: Patient is not psychotic,  Labs on Admission: I have personally reviewed following labs and imaging studies  CBC: Recent Labs  Lab 06/13/21 1411  WBC 3.5*  HGB 12.8*  HCT 39.0  MCV 84.6  PLT 458   Basic Metabolic Panel: Recent Labs  Lab 06/13/21 1411 06/14/21 0939  NA 138  --   K 3.4*  --   CL 106  --   CO2 26  --   GLUCOSE 106*  --   BUN 13  --   CREATININE 0.80  --   CALCIUM 8.6*  --   MG  --  1.9   GFR: Estimated Creatinine Clearance: 120.6 mL/min (by C-G formula based on SCr of 0.8 mg/dL). Liver  Function Tests: Recent Labs  Lab 06/13/21 1411  AST 30  ALT 34  ALKPHOS 49  BILITOT 0.8  PROT 7.1  ALBUMIN 3.6   No results for input(s): LIPASE, AMYLASE in the last 168 hours. Recent Labs  Lab 06/14/21 0939  AMMONIA 21   Coagulation Profile: No results for input(s): INR, PROTIME in the last 168 hours. Cardiac Enzymes: No results for input(s): CKTOTAL, CKMB, CKMBINDEX, TROPONINI in the last 168 hours. BNP (last 3 results) No results for input(s): PROBNP in the last 8760 hours. HbA1C: No results for input(s): HGBA1C in the last 72 hours. CBG: No results for input(s): GLUCAP in the last 168 hours. Lipid Profile: No results for input(s): CHOL, HDL, LDLCALC, TRIG, CHOLHDL, LDLDIRECT in the last 72 hours. Thyroid Function Tests: Recent Labs    06/14/21 0939  TSH 0.306*   Anemia Panel: No results for input(s): VITAMINB12, FOLATE, FERRITIN, TIBC, IRON, RETICCTPCT in the last 72 hours. Urine analysis:    Component Value Date/Time   COLORURINE YELLOW (A) 06/14/2021 0247   APPEARANCEUR CLEAR (A) 06/14/2021 0247   LABSPEC 1.028 06/14/2021 0247   PHURINE 5.0 06/14/2021 0247   GLUCOSEU >=500 (A) 06/14/2021 0247   HGBUR NEGATIVE 06/14/2021 0247   BILIRUBINUR NEGATIVE 06/14/2021  0247   KETONESUR 5 (A) 06/14/2021 0247   PROTEINUR NEGATIVE 06/14/2021 0247   NITRITE NEGATIVE 06/14/2021 0247   LEUKOCYTESUR NEGATIVE 06/14/2021 0247   Sepsis Labs: @LABRCNTIP (procalcitonin:4,lacticidven:4) ) Recent Results (from the past 240 hour(s))  Resp Panel by RT-PCR (Flu A&B, Covid) Nasopharyngeal Swab     Status: None   Collection Time: 06/14/21  2:47 AM   Specimen: Nasopharyngeal Swab; Nasopharyngeal(NP) swabs in vial transport medium  Result Value Ref Range Status   SARS Coronavirus 2 by RT PCR NEGATIVE NEGATIVE Final    Comment: (NOTE) SARS-CoV-2 target nucleic acids are NOT DETECTED.  The SARS-CoV-2 RNA is generally detectable in upper respiratory specimens during the acute phase of infection. The lowest concentration of SARS-CoV-2 viral copies this assay can detect is 138 copies/mL. A negative result does not preclude SARS-Cov-2 infection and should not be used as the sole basis for treatment or other patient management decisions. A negative result may occur with  improper specimen collection/handling, submission of specimen other than nasopharyngeal swab, presence of viral mutation(s) within the areas targeted by this assay, and inadequate number of viral copies(<138 copies/mL). A negative result must be combined with clinical observations, patient history, and epidemiological information. The expected result is Negative.  Fact Sheet for Patients:  EntrepreneurPulse.com.au  Fact Sheet for Healthcare Providers:  IncredibleEmployment.be  This test is no t yet approved or cleared by the [place] FDA and  has been authorized for detection and/or diagnosis of SARS-CoV-2 by FDA under an Emergency Use Authorization (EUA). This EUA will remain  in effect (meaning this test can be used) for the duration of the COVID-19 declaration under Section 564(b)(1) of the Act, 21 U.S.C.section 360bbb-3(b)(1), unless the authorization is  terminated  or revoked sooner.       Influenza A by PCR NEGATIVE NEGATIVE Final   Influenza B by PCR NEGATIVE NEGATIVE Final    Comment: (NOTE) The Xpert Xpress SARS-CoV-2/FLU/RSV plus assay is intended as an aid in the diagnosis of influenza from Nasopharyngeal swab specimens and should not be used as a sole basis for treatment. Nasal washings and aspirates are unacceptable for Xpert Xpress SARS-CoV-2/FLU/RSV testing.  Fact Sheet for Patients: EntrepreneurPulse.com.au  Fact Sheet for Healthcare Providers: IncredibleEmployment.be  This  test is not yet approved or cleared by the [place] and has been authorized for detection and/or diagnosis of SARS-CoV-2 by FDA under an Emergency Use Authorization (EUA). This EUA will remain in effect (meaning this test can be used) for the duration of the COVID-19 declaration under Section 564(b)(1) of the Act, 21 U.S.C. section 360bbb-3(b)(1), unless the authorization is terminated or revoked.  Performed at [facility], [address] [place], [place] [address]      Radiological Exams on Admission: DG Chest 1 View  Result Date: 06/13/2021 CLINICAL DATA:  Altered mental status. EXAM: CHEST  1 VIEW COMPARISON:  June 12, 2019 FINDINGS: Enlarged cardiac silhouette.  Mediastinal contours appear intact. No lobar airspace consolidation. Diffuse prominence of the interstitium. Osseous structures are without acute abnormality. Soft tissues are grossly normal. IMPRESSION: 1. Enlarged cardiac silhouette. 2. Diffuse prominence of the interstitium may represent interstitial pulmonary edema or atypical infection. Electronically Signed   By: Fidela Salisbury M.D.   On: 06/13/2021 18:32   CT HEAD WO CONTRAST (5MM)  Result Date: 06/14/2021 CLINICAL DATA:  Encephalopathy EXAM: CT HEAD WITHOUT CONTRAST TECHNIQUE: Contiguous axial images were obtained from the base of the skull through the vertex  without intravenous contrast. COMPARISON:  None. FINDINGS: Brain: There is no mass, hemorrhage or extra-axial collection. There is generalized atrophy without lobar predilection. Hypodensity of the white matter is most commonly associated with chronic microvascular disease. 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 Vascular: No abnormal hyperdensity of the major intracranial arteries or dural venous sinuses. No intracranial atherosclerosis. Skull: The visualized skull base, calvarium and extracranial soft tissues are normal. Sinuses/Orbits: Moderate paranasal sinus mucosal thickening. The orbits are normal. IMPRESSION: 1. No acute intracranial abnormality. 2. Chronic microvascular ischemia and generalized atrophy. Cerebral Atrophy (ICD10-G31.9). Electronically Signed   By: Ulyses Jarred M.D.   On: 06/14/2021 03:06   DG Pelvis Portable  Result Date: 06/14/2021 CLINICAL DATA:  Difficulty walking, alcohol abuse, generalized weakness, no reported injury EXAM: PORTABLE PELVIS 1-2 VIEWS COMPARISON:  None. FINDINGS: No pelvic fracture or diastasis. No evidence of hip dislocation on this frontal view. No discrete osseous lesions. Minimal right hip osteoarthritis. Mild lower lumbar spondylosis. Scattered small iliac wing enthesophytes bilaterally. Vascular calcifications throughout the soft tissues. IMPRESSION: No acute osseous abnormality. Minimal right hip osteoarthritis. Electronically Signed   By: Ilona Sorrel M.D.   On: 06/14/2021 08:43     EKG: I have personally reviewed.  Sinus rhythm, QTC 425, LAE, poor R wave progression  Assessment/Plan Principal Problem:   Acute metabolic encephalopathy Active Problems:   Cough   Hypokalemia   Depression   Diabetes mellitus without complication (HCC)   HLD (hyperlipidemia)   HTN (hypertension)   Acute metabolic encephalopathy: Etiology is not clear.  CT head negative for acute intracranial abnormalities.  Urinalysis negative. -Placed on MedSurg bed  for observation -Frequent neuro check -Follow-up MRI for brain to rule out intracranial abnormalities such as a stroke -IVF: 1L NS  Cough: No oxygen desaturation.  Chest x-ray negative.  Patient has WBC 3.5, but no fever.  Clinically does not seem to have sepsis.  May be due to upper respiratory viral infection.  Patient was given 1 dose of Rocephin and azithromycin in ED.  Procalcitonin <0.01.  Will hold off antibiotics. -As needed Mucinex and albuterol  Hypokalemia: Potassium 3.4 -Repleted potassium -Check magnesium level  Depression -Celexa  Possible diabetes mellitus without complication [facility]): His caregiver is not sure if patient has history of  diabetes, the patient is taking Jardiance at home, indicating possible diabetes mellitus.  Recent A1c 6.3, well controlled. -Sliding scale insulin  Hypertension: Patient is approved to take amlodipine, clonidine patch, metoprolol, hydralazine, but he is not taking those medications currently blood pressure 127/60 -We will restart amlodipine 10 mg daily and clonidine patch 0.2 mg/week  HLD (hyperlipidemia) -Nexlizet     DVT ppx: SQ Lovenox Code Status: Full code per his caregiver Family Communication: I spoke with his caregiver by phone Disposition Plan:  Anticipate discharge back to previous environment, group home Consults called: None Admission status and Level of care: Med-Surg:    Med-surg bed for obs   Status is: Observation  The patient remains OBS appropriate and will d/c before 2 midnights.          Date of Service 06/14/2021    Ivor Costa [facility]   If 7PM-7AM, please contact night-coverage [email or web address] 06/14/2021, 2:15 PM

## 2021-06-14 NOTE — ED Notes (Signed)
Attempt to call legal guardian x1 - message left to call back ED. No detailed info in VM.

## 2021-06-14 NOTE — Discharge Instructions (Addendum)

## 2021-06-14 NOTE — ED Notes (Signed)
Pt able to void with urinal, stands to transfer with 2 person assist.

## 2021-06-14 NOTE — ED Notes (Signed)
Another attempt to call Randon Goldsmith, legal guardian. No response, 2nd VM left.

## 2021-06-14 NOTE — ED Notes (Signed)
Pt resting in bed with eyes closed and even respirations

## 2021-06-15 DIAGNOSIS — G9341 Metabolic encephalopathy: Secondary | ICD-10-CM | POA: Diagnosis not present

## 2021-06-15 DIAGNOSIS — E876 Hypokalemia: Secondary | ICD-10-CM | POA: Diagnosis not present

## 2021-06-15 DIAGNOSIS — I10 Essential (primary) hypertension: Secondary | ICD-10-CM

## 2021-06-15 LAB — BASIC METABOLIC PANEL
Anion gap: 10 (ref 5–15)
BUN: 15 mg/dL (ref 6–20)
CO2: 25 mmol/L (ref 22–32)
Calcium: 8.9 mg/dL (ref 8.9–10.3)
Chloride: 105 mmol/L (ref 98–111)
Creatinine, Ser: 0.87 mg/dL (ref 0.61–1.24)
GFR, Estimated: >60 mL/min (ref >60)
Glucose, Bld: 89 mg/dL (ref 70–99)
Potassium: 3.2 mmol/L — ABNORMAL LOW (ref 3.5–5.1)
Sodium: 140 mmol/L (ref 135–145)

## 2021-06-15 LAB — GLUCOSE, CAPILLARY
Glucose-Capillary: 94 mg/dL (ref 70–99)
Glucose-Capillary: 127 mg/dL — ABNORMAL HIGH (ref 70–99)
Glucose-Capillary: 95 mg/dL (ref 70–99)
Glucose-Capillary: 115 mg/dL — ABNORMAL HIGH (ref 70–99)

## 2021-06-15 LAB — CBC
HCT: 40.8 % (ref 39.0–52.0)
Hemoglobin: 13.5 g/dL (ref 13.0–17.0)
MCH: 28 pg (ref 26.0–34.0)
MCHC: 33.1 g/dL (ref 30.0–36.0)
MCV: 84.6 fL (ref 80.0–100.0)
Platelets: 200 10*3/uL (ref 150–400)
RBC: 4.82 MIL/uL (ref 4.22–5.81)
RDW: 13 % (ref 11.5–15.5)
WBC: 3.2 10*3/uL — ABNORMAL LOW (ref 4.0–10.5)
nRBC: 0 % (ref 0.0–0.2)

## 2021-06-15 LAB — HIV ANTIBODY (ROUTINE TESTING W REFLEX): HIV Screen 4th Generation wRfx: NONREACTIVE

## 2021-06-15 MED ORDER — ALBUTEROL SULFATE (2.5 MG/3ML) 0.083% IN NEBU
2.5000 mg | INHALATION_SOLUTION | Freq: Four times a day (QID) | RESPIRATORY_TRACT | Status: DC
  Administered 2021-06-15 – 2021-06-16 (×3): 2.5 mg via RESPIRATORY_TRACT
  Filled 2021-06-15 (×3): qty 3

## 2021-06-15 NOTE — Evaluation (Signed)
Physical Therapy Evaluation Patient Details Name: Martin Diaz MRN: [id number] DOB: 1961/06/28 Today's Date: 06/15/2021  History of Present Illness  Pt is a 60 y.o. male presenting to hospital 11/5 with AMS, generalized weakness, cough, and congestion; difficulty ambulating.  Pt admitted with acute metabolic encephalopathy, cough, and hypokalemia.  PMH includes intellectual disability, alcohol abuse, kidney disease, and depression.  Clinical Impression  Prior to hospital admission, pt was ambulatory; lives at [facility].  Currently pt is SBA semi-supine to sitting edge of bed; CGA with transfers; and CGA ambulating 360 feet (no AD).  Mild increased BOS and mild shakiness noted with ambulation so CGA provided for safety but no loss of balance noted during sessions activities.  Pt would benefit from skilled PT to address noted impairments and functional limitations (see below for any additional details).  Upon hospital discharge, pt would benefit from [place].    Recommendations for follow up therapy are one component of a multi-disciplinary discharge planning process, led by the attending physician.  Recommendations may be updated based on patient status, additional functional criteria and insurance authorization.  Follow Up Recommendations Home health PT    Assistance Recommended at Discharge Intermittent Supervision/Assistance  Functional Status Assessment Patient has had a recent decline in their functional status and demonstrates the ability to make significant improvements in function in a reasonable and predictable amount of time.  Equipment Recommendations  None recommended by PT    Recommendations for Other Services       Precautions / Restrictions Precautions Precautions: Fall Restrictions Weight Bearing Restrictions: No      Mobility  Bed Mobility Overal bed mobility: Needs Assistance Bed Mobility: Supine to Sit     Supine to sit: Supervision;HOB elevated      General bed mobility comments: mild increased effort to perform on own    Transfers Overall transfer level: Needs assistance Equipment used: None Transfers: Sit to/from Stand;Bed to chair/wheelchair/BSC Sit to Stand: Min guard   Step pivot transfers: Min guard       General transfer comment: mild increased effort to stand from bed x1 trial and from recliner x1 trial; stand step turn bed to recliner CGA    Ambulation/Gait Ambulation/Gait assistance: Min guard Gait Distance (Feet): 360 Feet Assistive device: None   Gait velocity: mildly decreased     General Gait Details: mild increased BOS and mild general shakiness noted (CGA provided for safety but no loss of balance noted)  Stairs            Wheelchair Mobility    Modified Rankin (Stroke Patients Only)       Balance Overall balance assessment: Needs assistance Sitting-balance support: No upper extremity supported;Feet supported   Sitting balance - Comments: steady sitting reaching outside BOS   Standing balance support: No upper extremity supported;During functional activity Standing balance-Leahy Scale: Good Standing balance comment: no loss of balance with ambulation                             Pertinent Vitals/Pain Pain Assessment: No/denies pain Vitals (HR and O2 on room air) WFL throughout treatment session.    Home Living Family/patient expects to be discharged to:: Group home                   Additional [facility]; pt has legal guardian    Prior Function Prior Level of Function : Independent/Modified Independent  Mobility Comments: Independent with ambulation per chart review       Hand Dominance        Extremity/Trunk Assessment   Upper Extremity Assessment Upper Extremity Assessment: Generalized weakness    Lower Extremity Assessment Lower Extremity Assessment: Generalized weakness    Cervical / Trunk Assessment Cervical  / Trunk Assessment: Normal  Communication   Communication:  (mild increased time to respond)  Cognition Arousal/Alertness: Awake/alert Behavior During Therapy: Flat affect                                   General Comments: Oriented to name and month of birthday only        General Comments  Nursing cleared pt for participation in physical therapy.  Pt agreeable to PT session.    Exercises  Ambulation   Assessment/Plan    PT Assessment Patient needs continued PT services  PT Problem List Decreased strength;Decreased balance;Decreased mobility       PT Treatment Interventions DME instruction;Gait training;Stair training;Functional mobility training;Therapeutic activities;Therapeutic exercise;Balance training;Patient/family education    PT Goals (Current goals can be found in the Care Plan section)  Acute Rehab PT Goals Patient Stated Goal: to improve walking PT Goal Formulation: With patient Time For Goal Achievement: 06/29/21 Potential to Achieve Goals: Good    Frequency Min 2X/week   Barriers to discharge        Co-evaluation               AM-PAC PT "6 Clicks" Mobility  Outcome Measure Help needed turning from your back to your side while in a flat bed without using bedrails?: None Help needed moving from lying on your back to sitting on the side of a flat bed without using bedrails?: A Little Help needed moving to and from a bed to a chair (including a wheelchair)?: A Little Help needed standing up from a chair using your arms (e.g., wheelchair or bedside chair)?: A Little Help needed to walk in hospital room?: A Little Help needed climbing 3-5 steps with a railing? : A Little 6 Click Score: 19    End of Session Equipment Utilized During Treatment: Gait belt Activity Tolerance: Patient tolerated treatment well Patient left: in chair;with call bell/phone within reach;with chair alarm set;with nursing/sitter in room Nurse Communication:  Mobility status;Precautions (pt needing sponge-bath d/t incontinence in bed (therapist changed pt into clean gown)) PT Visit Diagnosis: Unsteadiness on feet (R26.81);Muscle weakness (generalized) (M62.81)    Time: 9983-3825 PT Time Calculation (min) (ACUTE ONLY): 23 min   Charges:   PT Evaluation $PT Eval Low Complexity: 1 Low PT Treatments $Therapeutic Exercise: 8-22 mins       Leitha Bleak, PT 06/15/21, 5:47 PM

## 2021-06-15 NOTE — Progress Notes (Signed)
PROGRESS NOTE    Martin Diaz  MHD:[id number] DOB: 1961-03-23 DOA: 06/14/2021 PCP: Patient, No Pcp Per (Inactive)  Assessment & Plan:   Principal Problem:   Acute metabolic encephalopathy Active Problems:   Cough   Hypokalemia   Depression   Diabetes mellitus without complication (HCC)   HLD (hyperlipidemia)   HTN (hypertension)   Acute metabolic encephalopathy: etiology unclear.  CT head negative for acute intracranial abnormalities. MRI brain shows no acute intracranial abnormality but age advanced atrophy & multiple chronic lacunar infarcts. Urine cx ordered. Mental status currently seems like this is pt's baseline as per pt's aunt  Cough: CXR is neg.  Does not meet sepsis criteria. S/p Rocephin and azithromycin x1 in the ER. Pro-cal <0.10  Will hold off antibiotics.   Hypokalemia: KCl repleated.    Depression: severity unknown. Continue on home dose of citalopram    Possible DM2: HbA1c 6.3, well controlled.  Continue on SSI w/ accuchecks    HTN: continue on home dose of amlodipine, clonidine    HLD: continue on home dose of zetia    DVT prophylaxis: lovenox  Code Status:  full  Family Communication: discussed pt's care w/ pt's aunt, Fraser Din, and answered her questions  Disposition Plan: likely back to group home   Level of care: Med-Surg  Status is: Observation  The patient remains OBS appropriate and will d/c before 2 midnights.   Consultants:   Procedures:  Antimicrobials:   Subjective: Pt c/o cough.   Objective: Vitals:   06/14/21 1700 06/14/21 2134 06/14/21 2228 06/15/21 0436  BP: 139/68 140/86 137/77 133/74  Pulse:  79 68 66  Resp:  18 18 18   Temp:  97.8 F (36.6 C) 98.2 F (36.8 C) 98 F (36.7 C)  TempSrc:  Oral    SpO2:  93% 98% 98%  Weight:      Height:        Intake/Output Summary (Last 24 hours) at 06/15/2021 0732 Last data filed at 06/14/2021 2340 Gross per 24 hour  Intake --  Output 1300 ml  Net -1300 ml   Filed Weights    06/13/21 1400  Weight: 95.3 kg    Examination:  General exam: Appears calm and comfortable  Respiratory system: diminished breath sounds b/l otherwise clear  Cardiovascular system: S1 & S2 +. No rubs, gallops or clicks.  Gastrointestinal system: Abdomen is nondistended, soft and nontender. Normal bowel sounds heard. Central nervous system: Alert and awake. Moves all extremities  Psychiatry: Judgement and insight appear poor. Flat mood and affect     Data Reviewed: I have personally reviewed following labs and imaging studies  CBC: Recent Labs  Lab 06/13/21 1411 06/15/21 0536  WBC 3.5* 3.2*  HGB 12.8* 13.5  HCT 39.0 40.8  MCV 84.6 84.6  PLT 184 211   Basic Metabolic Panel: Recent Labs  Lab 06/13/21 1411 06/14/21 0939 06/15/21 0536  NA 138  --  140  K 3.4*  --  3.2*  CL 106  --  105  CO2 26  --  25  GLUCOSE 106*  --  89  BUN 13  --  15  CREATININE 0.80  --  0.87  CALCIUM 8.6*  --  8.9  MG  --  1.9  --    GFR: Estimated Creatinine Clearance: 110.9 mL/min (by C-G formula based on SCr of 0.87 mg/dL). Liver Function Tests: Recent Labs  Lab 06/13/21 1411  AST 30  ALT 34  ALKPHOS 49  BILITOT 0.8  PROT 7.1  ALBUMIN 3.6   No results for input(s): LIPASE, AMYLASE in the last 168 hours. Recent Labs  Lab 06/14/21 0939  AMMONIA 21   Coagulation Profile: No results for input(s): INR, PROTIME in the last 168 hours. Cardiac Enzymes: No results for input(s): CKTOTAL, CKMB, CKMBINDEX, TROPONINI in the last 168 hours. BNP (last 3 results) No results for input(s): PROBNP in the last 8760 hours. HbA1C: No results for input(s): HGBA1C in the last 72 hours. CBG: Recent Labs  Lab 06/14/21 1734 06/14/21 2240  GLUCAP 97 105*   Lipid Profile: No results for input(s): CHOL, HDL, LDLCALC, TRIG, CHOLHDL, LDLDIRECT in the last 72 hours. Thyroid Function Tests: Recent Labs    06/14/21 0939  TSH 0.306*   Anemia Panel: No results for input(s): VITAMINB12, FOLATE,  FERRITIN, TIBC, IRON, RETICCTPCT in the last 72 hours. Sepsis Labs: Recent Labs  Lab 06/13/21 0253  PROCALCITON <0.10    Recent Results (from the past 240 hour(s))  Resp Panel by RT-PCR (Flu A&B, Covid) Nasopharyngeal Swab     Status: None   Collection Time: 06/14/21  2:47 AM   Specimen: Nasopharyngeal Swab; Nasopharyngeal(NP) swabs in vial transport medium  Result Value Ref Range Status   SARS Coronavirus 2 by RT PCR NEGATIVE NEGATIVE Final    Comment: (NOTE) SARS-CoV-2 target nucleic acids are NOT DETECTED.  The SARS-CoV-2 RNA is generally detectable in upper respiratory specimens during the acute phase of infection. The lowest concentration of SARS-CoV-2 viral copies this assay can detect is 138 copies/mL. A negative result does not preclude SARS-Cov-2 infection and should not be used as the sole basis for treatment or other patient management decisions. A negative result may occur with  improper specimen collection/handling, submission of specimen other than nasopharyngeal swab, presence of viral mutation(s) within the areas targeted by this assay, and inadequate number of viral copies(<138 copies/mL). A negative result must be combined with clinical observations, patient history, and epidemiological information. The expected result is Negative.  Fact Sheet for Patients:  EntrepreneurPulse.com.au  Fact Sheet for Healthcare Providers:  IncredibleEmployment.be  This test is no t yet approved or cleared by the [place] FDA and  has been authorized for detection and/or diagnosis of SARS-CoV-2 by FDA under an Emergency Use Authorization (EUA). This EUA will remain  in effect (meaning this test can be used) for the duration of the COVID-19 declaration under Section 564(b)(1) of the Act, 21 U.S.C.section 360bbb-3(b)(1), unless the authorization is terminated  or revoked sooner.       Influenza A by PCR NEGATIVE NEGATIVE Final    Influenza B by PCR NEGATIVE NEGATIVE Final    Comment: (NOTE) The Xpert Xpress SARS-CoV-2/FLU/RSV plus assay is intended as an aid in the diagnosis of influenza from Nasopharyngeal swab specimens and should not be used as a sole basis for treatment. Nasal washings and aspirates are unacceptable for Xpert Xpress SARS-CoV-2/FLU/RSV testing.  Fact Sheet for Patients: EntrepreneurPulse.com.au  Fact Sheet for Healthcare Providers: IncredibleEmployment.be  This test is not yet approved or cleared by the [place] FDA and has been authorized for detection and/or diagnosis of SARS-CoV-2 by FDA under an Emergency Use Authorization (EUA). This EUA will remain in effect (meaning this test can be used) for the duration of the COVID-19 declaration under Section 564(b)(1) of the Act, 21 U.S.C. section 360bbb-3(b)(1), unless the authorization is terminated or revoked.  Performed at [facility], [address] [place], [place] [address]          Radiology Studies:  DG Orbits  Result Date: 06/14/2021 CLINICAL DATA:  Radiographic clearance for MRI due to altered mental status. EXAM: ORBITS - COMPLETE 4+ VIEW COMPARISON:  None. FINDINGS: There is no evidence of fracture or other significant bone abnormality. No orbital emphysema or sinus air-fluid levels are seen. No radiopaque foreign body. IMPRESSION: No radiopaque foreign body. Electronically Signed   By: Dahlia Bailiff M.D.   On: 06/14/2021 15:39   DG Chest 1 View  Result Date: 06/13/2021 CLINICAL DATA:  Altered mental status. EXAM: CHEST  1 VIEW COMPARISON:  June 12, 2019 FINDINGS: Enlarged cardiac silhouette.  Mediastinal contours appear intact. No lobar airspace consolidation. Diffuse prominence of the interstitium. Osseous structures are without acute abnormality. Soft tissues are grossly normal. IMPRESSION: 1. Enlarged cardiac silhouette. 2. Diffuse prominence of the interstitium may  represent interstitial pulmonary edema or atypical infection. Electronically Signed   By: Fidela Salisbury M.D.   On: 06/13/2021 18:32   DG Abd 1 View  Result Date: 06/14/2021 CLINICAL DATA:  Clearance for MRI, patient unable to give detailed medical history due to altered mental status. EXAM: ABDOMEN - 1 VIEW COMPARISON:  Abdominal radiograph June 12, 2019 FINDINGS: The bowel gas pattern is normal. No radio-opaque foreign body. Degenerative changes spine. IMPRESSION: Nonobstructive bowel gas pattern. No radiopaque foreign body. Electronically Signed   By: Dahlia Bailiff M.D.   On: 06/14/2021 15:41   CT HEAD WO CONTRAST (5MM)  Result Date: 06/14/2021 CLINICAL DATA:  Encephalopathy EXAM: CT HEAD WITHOUT CONTRAST TECHNIQUE: Contiguous axial images were obtained from the base of the skull through the vertex without intravenous contrast. COMPARISON:  None. FINDINGS: Brain: There is no mass, hemorrhage or extra-axial collection. There is generalized atrophy without lobar predilection. Hypodensity of the white matter is most commonly associated with chronic microvascular disease. 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 Vascular: No abnormal hyperdensity of the major intracranial arteries or dural venous sinuses. No intracranial atherosclerosis. Skull: The visualized skull base, calvarium and extracranial soft tissues are normal. Sinuses/Orbits: Moderate paranasal sinus mucosal thickening. The orbits are normal. IMPRESSION: 1. No acute intracranial abnormality. 2. Chronic microvascular ischemia and generalized atrophy. Cerebral Atrophy (ICD10-G31.9). Electronically Signed   By: Ulyses Jarred M.D.   On: 06/14/2021 03:06   MR BRAIN WO CONTRAST  Result Date: 06/14/2021 CLINICAL DATA:  Neuro deficit, acute, stroke suspected. Altered mental status and weakness. EXAM: MRI HEAD WITHOUT CONTRAST TECHNIQUE: Multiplanar, multiecho pulse sequences of the brain and surrounding structures were obtained  without intravenous contrast. COMPARISON:  Head CT 06/14/2021 FINDINGS: Brain: There is no evidence of an acute infarct, intracranial hemorrhage, mass, midline shift, or extra-axial fluid collection. Chronic lacunar infarcts are noted in the bilateral basal ganglia, thalami, pons, and cerebellum. T2 hyperintensities in the cerebral white matter bilaterally are nonspecific but compatible with moderately age advanced chronic small vessel ischemic disease. There is moderately advanced cerebral and cerebellar atrophy. Vascular: Small and poorly visualized distal left vertebral artery, likely congenitally hypoplastic with a superimposed proximal flow limiting stenosis or occlusion also possible. Other major intracranial vascular flow voids are preserved. Skull and upper cervical spine: Diffusely diminished bone marrow T1 signal intensity, nonspecific but can be seen with anemia, smoking, and obesity. Sinuses/Orbits: Unremarkable orbits. Mucosal thickening throughout the paranasal sinuses, most extensive in the ethmoid sinuses. Small right mastoid effusion. Other: None. IMPRESSION: 1. No acute intracranial abnormality. 2. Age advanced atrophy and chronic small vessel ischemia with multiple chronic lacunar infarcts. Electronically Signed   By: Seymour Bars.D.  On: 06/14/2021 18:56   DG Pelvis Portable  Result Date: 06/14/2021 CLINICAL DATA:  Difficulty walking, alcohol abuse, generalized weakness, no reported injury EXAM: PORTABLE PELVIS 1-2 VIEWS COMPARISON:  None. FINDINGS: No pelvic fracture or diastasis. No evidence of hip dislocation on this frontal view. No discrete osseous lesions. Minimal right hip osteoarthritis. Mild lower lumbar spondylosis. Scattered small iliac wing enthesophytes bilaterally. Vascular calcifications throughout the soft tissues. IMPRESSION: No acute osseous abnormality. Minimal right hip osteoarthritis. Electronically Signed   By: Ilona Sorrel M.D.   On: 06/14/2021 08:43         Scheduled Meds:  amLODipine  10 mg Oral Daily   ascorbic acid  250 mg Oral BID   citalopram  10 mg Oral Daily   cloNIDine  0.2 mg Transdermal Weekly   enoxaparin (LOVENOX) injection  40 mg Subcutaneous Q24H   ezetimibe  10 mg Oral Daily   folic acid  1 mg Oral Daily   insulin aspart  0-5 Units Subcutaneous QHS   insulin aspart  0-9 Units Subcutaneous TID WC   multivitamin with minerals  1 tablet Oral Daily   OLANZapine  5 mg Oral QHS   potassium chloride  40 mEq Oral Once   tamsulosin  0.4 mg Oral Daily   thiamine  100 mg Oral Daily   Or   thiamine  100 mg Intravenous Daily   Continuous Infusions:   LOS: 0 days    Time spent: 32 mins     Wyvonnia Dusky, MD [facility] Pager [phone]  If 7PM-7AM, please contact night-coverage 06/15/2021, 7:32 AM

## 2021-06-15 NOTE — TOC Progression Note (Addendum)
Transition of Care [facility]) - Progression Note    Patient Details  Name: Nasir Bright MRN: [id number] Date of Birth: 18-Dec-1960  Transition [facility]) CM/SW [place], RN Phone Number: 06/15/2021, 9:38 AM  Clinical Narrative:   Patient is from [facility] [phone], Mental status is at Baseline, I called [facility] to confirm that the patient will be returning to [facility], left a general HIPPA appropriate VM requesting  a call back, awaiting a call         Expected Discharge Plan and Services                                                 Social Determinants of Health (SDOH) Interventions    Readmission Risk Interventions No flowsheet data found.

## 2021-06-15 NOTE — Plan of Care (Signed)

## 2021-06-16 DIAGNOSIS — G9341 Metabolic encephalopathy: Secondary | ICD-10-CM | POA: Diagnosis not present

## 2021-06-16 DIAGNOSIS — R051 Acute cough: Secondary | ICD-10-CM | POA: Diagnosis not present

## 2021-06-16 DIAGNOSIS — I10 Essential (primary) hypertension: Secondary | ICD-10-CM | POA: Diagnosis not present

## 2021-06-16 LAB — BASIC METABOLIC PANEL
Anion gap: 8 (ref 5–15)
BUN: 14 mg/dL (ref 6–20)
CO2: 26 mmol/L (ref 22–32)
Calcium: 9.1 mg/dL (ref 8.9–10.3)
Chloride: 105 mmol/L (ref 98–111)
Creatinine, Ser: 0.82 mg/dL (ref 0.61–1.24)
GFR, Estimated: >60 mL/min (ref >60)
Glucose, Bld: 109 mg/dL — ABNORMAL HIGH (ref 70–99)
Potassium: 3.5 mmol/L (ref 3.5–5.1)
Sodium: 139 mmol/L (ref 135–145)

## 2021-06-16 LAB — CBC
HCT: 41.2 % (ref 39.0–52.0)
Hemoglobin: 13.6 g/dL (ref 13.0–17.0)
MCH: 27.6 pg (ref 26.0–34.0)
MCHC: 33 g/dL (ref 30.0–36.0)
MCV: 83.6 fL (ref 80.0–100.0)
Platelets: 206 10*3/uL (ref 150–400)
RBC: 4.93 MIL/uL (ref 4.22–5.81)
RDW: 12.7 % (ref 11.5–15.5)
WBC: 3.4 10*3/uL — ABNORMAL LOW (ref 4.0–10.5)
nRBC: 0 % (ref 0.0–0.2)

## 2021-06-16 LAB — URINE CULTURE: Culture: NO GROWTH

## 2021-06-16 LAB — GLUCOSE, CAPILLARY
Glucose-Capillary: 137 mg/dL — ABNORMAL HIGH (ref 70–99)
Glucose-Capillary: 117 mg/dL — ABNORMAL HIGH (ref 70–99)

## 2021-06-16 MED ORDER — DOCUSATE SODIUM 100 MG PO CAPS
200.0000 mg | ORAL_CAPSULE | Freq: Two times a day (BID) | ORAL | Status: DC
  Administered 2021-06-16: 200 mg via ORAL
  Filled 2021-06-16: qty 2

## 2021-06-16 MED ORDER — BENZONATATE 200 MG PO CAPS: 200.0000 mg | ORAL_CAPSULE | Freq: Three times a day (TID) | ORAL | 0 refills | Status: AC | PRN

## 2021-06-16 MED ORDER — LACTULOSE 10 GM/15ML PO SOLN
20.0000 g | Freq: Two times a day (BID) | ORAL | Status: DC | PRN
  Filled 2021-06-16 (×2): qty 30

## 2021-06-16 MED ORDER — BISACODYL 5 MG PO TBEC
10.0000 mg | DELAYED_RELEASE_TABLET | Freq: Once | ORAL | Status: AC
  Administered 2021-06-16: 10 mg via ORAL
  Filled 2021-06-16: qty 2

## 2021-06-16 NOTE — Progress Notes (Signed)
Pt d/c to group home via POV with caregiver. D/C paperwork was reviewed with pt and caregiver and they expressed understanding.  IV was removed from pts L forearm without issue.  All belongings were packed up and taken at time of d/c.  Legal Guardian and Group home are both aware pt has been discharged.

## 2021-06-16 NOTE — Evaluation (Signed)
Occupational Therapy Evaluation Patient Details Name: Martin Diaz MRN: [id number] DOB: 01-16-1961 Today's Date: 06/16/2021   History of Present Illness Pt is a 60 y.o. male presenting to hospital 11/5 with AMS, generalized weakness, cough, and congestion; difficulty ambulating.  Pt admitted with acute metabolic encephalopathy, cough, and hypokalemia.  PMH includes intellectual disability, alcohol abuse, kidney disease, and depression.   Clinical Impression   Martin Diaz presents today with reduced endurance and generalized weakness. Prior to admission, he lived at a group home, were he receives assistance with ADL and IADL management. During today's evaluation, Martin Diaz is able to perform bed mobility, transfers, toileting, grooming, and dressing, requiring verbal cueing and supervision for safety, but able to complete most activities without physical assistance. He does require some assistance with initiating and sequencing dressing tasks. He denies pain, dizziness, fatigue, and states that he now feels "normal." According to group home staff member who is present in room, Martin Diaz appears to be at or close to his baseline level of fxl mobility. No further OT services are needed at this time.     Recommendations for follow up therapy are one component of a multi-disciplinary discharge planning process, led by the attending physician.  Recommendations may be updated based on patient status, additional functional criteria and insurance authorization.   Follow Up Recommendations  No OT follow up    Assistance Recommended at Discharge None  Functional Status Assessment  Patient has had a recent decline in their functional status and demonstrates the ability to make significant improvements in function in a reasonable and predictable amount of time.  Equipment Recommendations  None recommended by OT    Recommendations for Other Services       Precautions / Restrictions  Precautions Precautions: Fall Restrictions Weight Bearing Restrictions: No      Mobility Bed Mobility Overal bed mobility: Needs Assistance Bed Mobility: Supine to Sit;Sit to Supine     Supine to sit: Modified independent (Device/Increase time) Sit to supine: Modified independent (Device/Increase time)   General bed mobility comments: slightly increased time    Transfers Overall transfer level: Needs assistance Equipment used: None Transfers: Sit to/from Stand Sit to Stand: Modified independent (Device/Increase time)           General transfer comment: slightly increased time      Balance Overall balance assessment: Needs assistance Sitting-balance support: No upper extremity supported;Feet supported Sitting balance-Leahy Scale: Good Sitting balance - Comments: steady sitting reaching outside BOS   Standing balance support: No upper extremity supported;During functional activity Standing balance-Leahy Scale: Good Standing balance comment: able to stand for UB and LB dressing, toileting, grooming, no LOB; ambulates slowly but steadily                           ADL either performed or assessed with clinical judgement   ADL Overall ADL's : Needs assistance/impaired     Grooming: Wash/dry hands;Wash/dry face Grooming Details (indicate cue type and reason): standing at sink. Requires cueing to find/use soap and reminder to turn off water         Upper Body Dressing : Minimal assistance Upper Body Dressing Details (indicate cue type and reason): Min A for putting head through shirt Lower Body Dressing: Moderate assistance Lower Body Dressing Details (indicate cue type and reason): Mod A for threading legs into pants Toilet Transfer: Min guard;Ambulation Toilet Transfer Details (indicate cue type and reason): CGA for safety Toileting- Clothing Manipulation and Hygiene:  Min guard Toileting - Clothing Manipulation Details (indicate cue type and reason):  Verbal cueing for lifting gown out of way             Vision         Perception     Praxis      Pertinent Vitals/Pain Pain Assessment: No/denies pain     Hand Dominance     Extremity/Trunk Assessment Upper Extremity Assessment Upper Extremity Assessment: Overall WFL for tasks assessed   Lower Extremity Assessment Lower Extremity Assessment: Overall WFL for tasks assessed   Cervical / Trunk Assessment Cervical / Trunk Assessment: Normal   Communication Communication Communication: Expressive difficulties   Cognition Arousal/Alertness: Awake/alert Behavior During Therapy: Flat affect Overall Cognitive Status: History of cognitive impairments - at baseline                                 General Comments: Aware he is at hospital, does not know why. Cannot provide month or year.     General Comments       Exercises Other Exercises Other Exercises: Grooming, toileting, UB and LB dressing   Shoulder Instructions      Home Living Family/patient expects to be discharged to:: Group home                                 Additional Comments: [facility]; pt has legal guardian      Prior Functioning/Environment Prior Level of Function : Needs assist  Cognitive Assist : ADLs (cognitive)   ADLs (Cognitive): Intermittent cues Physical Assist : ADLs (physical)   ADLs (physical): Dressing;Bathing   ADLs Comments: Receives assist from group home staff with dressing, bathing, med mgmt, cooking, cleaning, transport        OT Problem List: Decreased strength;Decreased activity tolerance      OT Treatment/Interventions:      OT Goals(Current goals can be found in the care plan section) Acute Rehab OT Goals Patient Stated Goal: to go home OT Goal Formulation: With patient Time For Goal Achievement: 06/30/21 Potential to Achieve Goals: Good  OT Frequency:     Barriers to D/C:            Co-evaluation               AM-PAC OT "6 Clicks" Daily Activity     Outcome Measure Help from another person eating meals?: None Help from another person taking care of personal grooming?: A Little Help from another person toileting, which includes using toliet, bedpan, or urinal?: A Little Help from another person bathing (including washing, rinsing, drying)?: A Lot Help from another person to put on and taking off regular upper body clothing?: A Little Help from another person to put on and taking off regular lower body clothing?: A Little 6 Click Score: 18   End of Session    Activity Tolerance: Patient tolerated treatment well Patient left: in bed;with call bell/phone within reach;with bed alarm set;with family/visitor present;with nursing/sitter in room  OT Visit Diagnosis: Unsteadiness on feet (R26.81);Muscle weakness (generalized) (M62.81)                Time: 5885-0277 OT Time Calculation (min): 14 min Charges:  OT General Charges $OT Visit: 1 Visit OT Evaluation $OT Eval Moderate Complexity: 1 Mod OT Treatments $Self Care/Home Management : 8-22 mins Josiah Lobo, PhD, MS,  OTR/L 06/16/21, 12:56 PM

## 2021-06-16 NOTE — Discharge Summary (Addendum)
Physician Discharge Summary  Martin Diaz ZOX:[id number] DOB: Oct 10, 1960 DOA: 06/14/2021  PCP: Patient, No Pcp Per (Inactive)  Admit date: 06/14/2021 Discharge date: 06/16/2021  Admitted From: group home  Disposition:  group home   Recommendations for Outpatient Follow-up:  Follow up with PCP in 1-2 weeks  Home Health: yes  Equipment/Devices:  Discharge Condition: stable  CODE STATUS: full  Diet recommendation: Heart Healthy / Carb Modified   Brief/Interim Summary: HPI was taken from Dr. Blaine Hamper: Martin Diaz is a 60 y.o. male with medical history significant of intellectual disability, hypertension, hyperlipidemia, possible diabetes mellitus (patient is on Jardiance), depression, pancreatitis, rhabdomyolysis, alcohol abuse (stopped drinking alcohol for a long time), dysphagia, who presents with altered mental status and weakness.   Pt is from group home. Patient has AMS and hx of intellectual disability, and is unable to provide accurate medical history, therefore, most of the history is obtained by discussing the case with ED physician, per EMS report, and with the nursing staff. I also called her caregiver in facility, who provided some history, but still history is limited.   Per his caregiver, patient normally knows his own name, interacting with people, knows the place where he is living, but often confused about time.  Patient is normally talking and able to walk.  Today patient was found to have weakness, less interactive, not walking, very lethargic.  Per his caregiver, his mental status is worse than baseline.   Patient was noted to have dry cough, runny nose and congestion in the past several days, does not seem to have respiratory distress.  Patient denies chest pain or abdominal pain.  No active nausea, vomiting, diarrhea noted.  Patient moves all extremities.  No facial droop or slurred speech.     ED Course: pt was found to have WBC 3.5, troponin level 7, procalcitonin level  <0.10, negative urinalysis, negative COVID PCR, BNP level 50.4, potassium 3.4, renal function okay, temperature normal, blood pressure 127/60, heart rate 59, RR 17, oxygen saturation 96% on room air.  CT of head is negative for acute intracranial abnormalities.  Chest x-ray negative.  X-ray of her pelvis is negative.  Patient is placed on MedSurg bed for observation.    Hospital course from Dr. Jimmye Norman 11/7-11/8/22: Pt presented w/ metabolic encephalopathy of unknown etiology. CT head was neg for any acute intracranial abnormalities, MRI brain showed no acute intracranial abnormality but age advanced atrophy & multiple chronic lacunar infarcts. Urine cx showed no growth. Mental status was back to baseline prior to d/c. PT evaluated the pt and recommended home health. HH was set up by CM prior to d/c.     Discharge Diagnoses:  Principal Problem:   Acute metabolic encephalopathy Active Problems:   Cough   Hypokalemia   Depression   Diabetes mellitus without complication (HCC)   HLD (hyperlipidemia)   HTN (hypertension)  Acute metabolic encephalopathy: etiology unclear.  CT head negative for acute intracranial abnormalities. MRI brain shows no acute intracranial abnormality but age advanced atrophy & multiple chronic lacunar infarcts. Urine cx showed no growth. Mental status is back to baseline   Cough: CXR is neg.  Does not meet sepsis criteria. S/p Rocephin and azithromycin x1 in the ER. Pro-cal <0.10  Will hold off antibiotics. Tessalon pearls prn    Hypokalemia: WNL today    Depression: severity unknown. Continue on home dose of citalopram    Possible DM2: HbA1c 6.3, well controlled.  Likely diet controlled    HTN: continue on home dose  of amlodipine, clonidine    HLD: continue on home dose of zetia   Discharge Instructions  Discharge Instructions     Diet - low sodium heart healthy   Complete by: As directed    Diet Carb Modified   Complete by: As directed    Discharge  instructions   Complete by: As directed    F/u w/ PCP in 1 week   Increase activity slowly   Complete by: As directed       Allergies as of 06/16/2021   No Known Allergies      Medication List     TAKE these medications    amLODipine 10 MG tablet Commonly known as: NORVASC Take 1 tablet (10 mg total) by mouth daily. Notes to patient: Last dose given 06/16/2021 at 0958am   ascorbic acid 250 MG tablet Commonly known as: VITAMIN C Take 1 tablet (250 mg total) by mouth 2 (two) times daily. Notes to patient: Last dose given 06/16/2021 at 0958am   benzonatate 200 MG capsule Commonly known as: TESSALON Take 1 capsule (200 mg total) by mouth 3 (three) times daily as needed for up to 7 days for cough.   citalopram 10 MG tablet Commonly known as: CELEXA Take 10 mg by mouth daily. Notes to patient: Last dose given 06/16/2021 at 0959am   cloNIDine 0.2 mg/24hr patch Commonly known as: CATAPRES - Dosed in mg/24 hr Place 1 patch (0.2 mg total) onto the skin once a week. Notes to patient: Last dose given 06-15-2021 at 5:36pm   feeding supplement Liqd Take 237 mLs by mouth 2 (two) times daily between meals. Notes to patient: Not given while in hospital.    folic acid 1 MG tablet Commonly known as: FOLVITE Take 1 tablet (1 mg total) by mouth daily. Notes to patient: Last dose given 06/16/2021 at 0958am   hydrALAZINE 50 MG tablet Commonly known as: APRESOLINE Take 1 tablet (50 mg total) by mouth every 8 (eight) hours. Notes to patient: Not given while in hospital.    Jardiance 10 MG Tabs tablet Generic drug: empagliflozin Take 10 mg by mouth daily.   megestrol 400 MG/10ML suspension Commonly known as: MEGACE Take 10 mLs (400 mg total) by mouth 2 (two) times daily.   metoprolol tartrate 25 MG tablet Commonly known as: LOPRESSOR Take 1 tablet (25 mg total) by mouth 2 (two) times daily.   multivitamin with minerals Tabs tablet Take 1 tablet by mouth daily.   Nexlizet  180-10 MG Tabs Generic drug: Bempedoic Acid-Ezetimibe Take 1 tablet by mouth daily.   OLANZapine 5 MG tablet Commonly known as: ZYPREXA Take 5 mg by mouth at bedtime.   tamsulosin 0.4 MG Caps capsule Commonly known as: FLOMAX Take 0.4 mg by mouth daily.   thiamine 100 MG tablet Take 1 tablet (100 mg total) by mouth daily.        Follow-up Information     [facility] Schedule an appointment as soon as possible for a visit in 3 days.   Contact information: [place] [address] [phone]                No Known Allergies  Consultations:    Procedures/Studies: DG Orbits  Result Date: 2021/06/15 CLINICAL DATA:  Radiographic clearance for MRI due to altered mental status. EXAM: ORBITS - COMPLETE 4+ VIEW COMPARISON:  None. FINDINGS: There is no evidence of fracture or other significant bone abnormality. No orbital emphysema or sinus air-fluid levels are  seen. No radiopaque foreign body. IMPRESSION: No radiopaque foreign body. Electronically Signed   By: Dahlia Bailiff M.D.   On: 06/14/2021 15:39   DG Chest 1 View  Result Date: 06/13/2021 CLINICAL DATA:  Altered mental status. EXAM: CHEST  1 VIEW COMPARISON:  June 12, 2019 FINDINGS: Enlarged cardiac silhouette.  Mediastinal contours appear intact. No lobar airspace consolidation. Diffuse prominence of the interstitium. Osseous structures are without acute abnormality. Soft tissues are grossly normal. IMPRESSION: 1. Enlarged cardiac silhouette. 2. Diffuse prominence of the interstitium may represent interstitial pulmonary edema or atypical infection. Electronically Signed   By: Fidela Salisbury M.D.   On: 06/13/2021 18:32   DG Abd 1 View  Result Date: 06/14/2021 CLINICAL DATA:  Clearance for MRI, patient unable to give detailed medical history due to altered mental status. EXAM: ABDOMEN - 1 VIEW COMPARISON:  Abdominal radiograph June 12, 2019 FINDINGS: The bowel gas pattern is  normal. No radio-opaque foreign body. Degenerative changes spine. IMPRESSION: Nonobstructive bowel gas pattern. No radiopaque foreign body. Electronically Signed   By: Dahlia Bailiff M.D.   On: 06/14/2021 15:41   CT HEAD WO CONTRAST (5MM)  Result Date: 06/14/2021 CLINICAL DATA:  Encephalopathy EXAM: CT HEAD WITHOUT CONTRAST TECHNIQUE: Contiguous axial images were obtained from the base of the skull through the vertex without intravenous contrast. COMPARISON:  None. FINDINGS: Brain: There is no mass, hemorrhage or extra-axial collection. There is generalized atrophy without lobar predilection. Hypodensity of the white matter is most commonly associated with chronic microvascular disease. 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 Vascular: No abnormal hyperdensity of the major intracranial arteries or dural venous sinuses. No intracranial atherosclerosis. Skull: The visualized skull base, calvarium and extracranial soft tissues are normal. Sinuses/Orbits: Moderate paranasal sinus mucosal thickening. The orbits are normal. IMPRESSION: 1. No acute intracranial abnormality. 2. Chronic microvascular ischemia and generalized atrophy. Cerebral Atrophy (ICD10-G31.9). Electronically Signed   By: Ulyses Jarred M.D.   On: 06/14/2021 03:06   MR BRAIN WO CONTRAST  Result Date: 06/14/2021 CLINICAL DATA:  Neuro deficit, acute, stroke suspected. Altered mental status and weakness. EXAM: MRI HEAD WITHOUT CONTRAST TECHNIQUE: Multiplanar, multiecho pulse sequences of the brain and surrounding structures were obtained without intravenous contrast. COMPARISON:  Head CT 06/14/2021 FINDINGS: Brain: There is no evidence of an acute infarct, intracranial hemorrhage, mass, midline shift, or extra-axial fluid collection. Chronic lacunar infarcts are noted in the bilateral basal ganglia, thalami, pons, and cerebellum. T2 hyperintensities in the cerebral white matter bilaterally are nonspecific but compatible with moderately age  advanced chronic small vessel ischemic disease. There is moderately advanced cerebral and cerebellar atrophy. Vascular: Small and poorly visualized distal left vertebral artery, likely congenitally hypoplastic with a superimposed proximal flow limiting stenosis or occlusion also possible. Other major intracranial vascular flow voids are preserved. Skull and upper cervical spine: Diffusely diminished bone marrow T1 signal intensity, nonspecific but can be seen with anemia, smoking, and obesity. Sinuses/Orbits: Unremarkable orbits. Mucosal thickening throughout the paranasal sinuses, most extensive in the ethmoid sinuses. Small right mastoid effusion. Other: None. IMPRESSION: 1. No acute intracranial abnormality. 2. Age advanced atrophy and chronic small vessel ischemia with multiple chronic lacunar infarcts. Electronically Signed   By: Logan Bores M.D.   On: 06/14/2021 18:56   DG Pelvis Portable  Result Date: 06/14/2021 CLINICAL DATA:  Difficulty walking, alcohol abuse, generalized weakness, no reported injury EXAM: PORTABLE PELVIS 1-2 VIEWS COMPARISON:  None. FINDINGS: No pelvic fracture or diastasis. No evidence of hip dislocation on this frontal view.  No discrete osseous lesions. Minimal right hip osteoarthritis. Mild lower lumbar spondylosis. Scattered small iliac wing enthesophytes bilaterally. Vascular calcifications throughout the soft tissues. IMPRESSION: No acute osseous abnormality. Minimal right hip osteoarthritis. Electronically Signed   By: Ilona Sorrel M.D.   On: 06/14/2021 08:43   (Echo, Carotid, EGD, Colonoscopy, ERCP)    Subjective: Pt denies any complaints    Discharge Exam: Vitals:   06/16/21 0818 06/16/21 1056  BP:  (!) 152/79  Pulse:  78  Resp:  18  Temp:  98 F (36.7 C)  SpO2: 96% 100%   Vitals:   06/16/21 0501 06/16/21 0721 06/16/21 0818 06/16/21 1056  BP: (!) 142/86 128/71  (!) 152/79  Pulse: 74 71  78  Resp: 17 16  18   Temp: 97.7 F (36.5 C) 98 F (36.7 C)  98 F  (36.7 C)  TempSrc:      SpO2: 93% 97% 96% 100%  Weight:      Height:        General: Pt is alert, awake, not in acute distress Cardiovascular:S1/S2 +, no rubs, no gallops Respiratory: CTA bilaterally, no wheezing, no rhonchi Abdominal: Soft, NT, ND, hypoactive bowel sounds  Extremities: no edema, no cyanosis    The results of significant diagnostics from this hospitalization (including imaging, microbiology, ancillary and laboratory) are listed below for reference.     Microbiology: Recent Results (from the past 240 hour(s))  Resp Panel by RT-PCR (Flu A&B, Covid) Nasopharyngeal Swab     Status: None   Collection Time: 06/14/21  2:47 AM   Specimen: Nasopharyngeal Swab; Nasopharyngeal(NP) swabs in vial transport medium  Result Value Ref Range Status   SARS Coronavirus 2 by RT PCR NEGATIVE NEGATIVE Final    Comment: (NOTE) SARS-CoV-2 target nucleic acids are NOT DETECTED.  The SARS-CoV-2 RNA is generally detectable in upper respiratory specimens during the acute phase of infection. The lowest concentration of SARS-CoV-2 viral copies this assay can detect is 138 copies/mL. A negative result does not preclude SARS-Cov-2 infection and should not be used as the sole basis for treatment or other patient management decisions. A negative result may occur with  improper specimen collection/handling, submission of specimen other than nasopharyngeal swab, presence of viral mutation(s) within the areas targeted by this assay, and inadequate number of viral copies(<138 copies/mL). A negative result must be combined with clinical observations, patient history, and epidemiological information. The expected result is Negative.  Fact Sheet for Patients:  EntrepreneurPulse.com.au  Fact Sheet for Healthcare Providers:  IncredibleEmployment.be  This test is no t yet approved or cleared by the [place] FDA and  has been authorized for detection and/or  diagnosis of SARS-CoV-2 by FDA under an Emergency Use Authorization (EUA). This EUA will remain  in effect (meaning this test can be used) for the duration of the COVID-19 declaration under Section 564(b)(1) of the Act, 21 U.S.C.section 360bbb-3(b)(1), unless the authorization is terminated  or revoked sooner.       Influenza A by PCR NEGATIVE NEGATIVE Final   Influenza B by PCR NEGATIVE NEGATIVE Final    Comment: (NOTE) The Xpert Xpress SARS-CoV-2/FLU/RSV plus assay is intended as an aid in the diagnosis of influenza from Nasopharyngeal swab specimens and should not be used as a sole basis for treatment. Nasal washings and aspirates are unacceptable for Xpert Xpress SARS-CoV-2/FLU/RSV testing.  Fact Sheet for Patients: EntrepreneurPulse.com.au  Fact Sheet for Healthcare Providers: IncredibleEmployment.be  This test is not yet approved or cleared by the [place] FDA  and has been authorized for detection and/or diagnosis of SARS-CoV-2 by FDA under an Emergency Use Authorization (EUA). This EUA will remain in effect (meaning this test can be used) for the duration of the COVID-19 declaration under Section 564(b)(1) of the Act, 21 U.S.C. section 360bbb-3(b)(1), unless the authorization is terminated or revoked.  Performed at [facility], [address] [place], [place] [address]   Urine Culture     Status: None   Collection Time: 06/14/21  2:47 AM   Specimen: Urine, Clean Catch  Result Value Ref Range Status   Specimen Description   Final    URINE, CLEAN CATCH Performed at [facility], [address] [place], [place] [address]    Special Requests   Final    NONE Performed at [facility], [address] [place], [place] [address]    Culture   Final    NO GROWTH Performed at [facility], [place] [address], [place],  [address]    Report Status 06/16/2021 FINAL  Final     Labs: BNP  (last 3 results) Recent Labs    06/13/21 1411  BNP 98.9   Basic Metabolic Panel: Recent Labs  Lab 06/13/21 1411 06/14/21 0939 06/15/21 0536 06/16/21 0702  NA 138  --  140 139  K 3.4*  --  3.2* 3.5  CL 106  --  105 105  CO2 26  --  25 26  GLUCOSE 106*  --  89 109*  BUN 13  --  15 14  CREATININE 0.80  --  0.87 0.82  CALCIUM 8.6*  --  8.9 9.1  MG  --  1.9  --   --    Liver Function Tests: Recent Labs  Lab 06/13/21 1411  AST 30  ALT 34  ALKPHOS 49  BILITOT 0.8  PROT 7.1  ALBUMIN 3.6   No results for input(s): LIPASE, AMYLASE in the last 168 hours. Recent Labs  Lab 06/14/21 0939  AMMONIA 21   CBC: Recent Labs  Lab 06/13/21 1411 06/15/21 0536 06/16/21 0702  WBC 3.5* 3.2* 3.4*  HGB 12.8* 13.5 13.6  HCT 39.0 40.8 41.2  MCV 84.6 84.6 83.6  PLT 184 200 206   Cardiac Enzymes: No results for input(s): CKTOTAL, CKMB, CKMBINDEX, TROPONINI in the last 168 hours. BNP: Invalid input(s): POCBNP CBG: Recent Labs  Lab 06/15/21 1221 06/15/21 1704 06/15/21 2052 06/16/21 0913 06/16/21 1139  GLUCAP 127* 95 115* 137* 117*   D-Dimer No results for input(s): DDIMER in the last 72 hours. Hgb A1c No results for input(s): HGBA1C in the last 72 hours. Lipid Profile No results for input(s): CHOL, HDL, LDLCALC, TRIG, CHOLHDL, LDLDIRECT in the last 72 hours. Thyroid function studies Recent Labs    06/14/21 0939  TSH 0.306*   Anemia work up No results for input(s): VITAMINB12, FOLATE, FERRITIN, TIBC, IRON, RETICCTPCT in the last 72 hours. Urinalysis    Component Value Date/Time   COLORURINE YELLOW (A) 06/14/2021 0247   APPEARANCEUR CLEAR (A) 06/14/2021 0247   LABSPEC 1.028 06/14/2021 0247   PHURINE 5.0 06/14/2021 0247   GLUCOSEU >=500 (A) 06/14/2021 0247   HGBUR NEGATIVE 06/14/2021 0247   BILIRUBINUR NEGATIVE 06/14/2021 0247   KETONESUR 5 (A) 06/14/2021 0247   PROTEINUR NEGATIVE 06/14/2021 0247   NITRITE NEGATIVE 06/14/2021 0247   LEUKOCYTESUR NEGATIVE  06/14/2021 0247   Sepsis Labs Invalid input(s): PROCALCITONIN,  WBC,  LACTICIDVEN Microbiology Recent Results (from the past 240 hour(s))  Resp Panel  by RT-PCR (Flu A&B, Covid) Nasopharyngeal Swab     Status: None   Collection Time: 06/14/21  2:47 AM   Specimen: Nasopharyngeal Swab; Nasopharyngeal(NP) swabs in vial transport medium  Result Value Ref Range Status   SARS Coronavirus 2 by RT PCR NEGATIVE NEGATIVE Final    Comment: (NOTE) SARS-CoV-2 target nucleic acids are NOT DETECTED.  The SARS-CoV-2 RNA is generally detectable in upper respiratory specimens during the acute phase of infection. The lowest concentration of SARS-CoV-2 viral copies this assay can detect is 138 copies/mL. A negative result does not preclude SARS-Cov-2 infection and should not be used as the sole basis for treatment or other patient management decisions. A negative result may occur with  improper specimen collection/handling, submission of specimen other than nasopharyngeal swab, presence of viral mutation(s) within the areas targeted by this assay, and inadequate number of viral copies(<138 copies/mL). A negative result must be combined with clinical observations, patient history, and epidemiological information. The expected result is Negative.  Fact Sheet for Patients:  EntrepreneurPulse.com.au  Fact Sheet for Healthcare Providers:  IncredibleEmployment.be  This test is no t yet approved or cleared by the [place] FDA and  has been authorized for detection and/or diagnosis of SARS-CoV-2 by FDA under an Emergency Use Authorization (EUA). This EUA will remain  in effect (meaning this test can be used) for the duration of the COVID-19 declaration under Section 564(b)(1) of the Act, 21 U.S.C.section 360bbb-3(b)(1), unless the authorization is terminated  or revoked sooner.       Influenza A by PCR NEGATIVE NEGATIVE Final   Influenza B by PCR NEGATIVE  NEGATIVE Final    Comment: (NOTE) The Xpert Xpress SARS-CoV-2/FLU/RSV plus assay is intended as an aid in the diagnosis of influenza from Nasopharyngeal swab specimens and should not be used as a sole basis for treatment. Nasal washings and aspirates are unacceptable for Xpert Xpress SARS-CoV-2/FLU/RSV testing.  Fact Sheet for Patients: EntrepreneurPulse.com.au  Fact Sheet for Healthcare Providers: IncredibleEmployment.be  This test is not yet approved or cleared by the [place] FDA and has been authorized for detection and/or diagnosis of SARS-CoV-2 by FDA under an Emergency Use Authorization (EUA). This EUA will remain in effect (meaning this test can be used) for the duration of the COVID-19 declaration under Section 564(b)(1) of the Act, 21 U.S.C. section 360bbb-3(b)(1), unless the authorization is terminated or revoked.  Performed at [facility], [address] [place], [place] [address]   Urine Culture     Status: None   Collection Time: 06/14/21  2:47 AM   Specimen: Urine, Clean Catch  Result Value Ref Range Status   Specimen Description   Final    URINE, CLEAN CATCH Performed at [facility], [address] [place], [place] [address]    Special Requests   Final    NONE Performed at [facility], [address], [place], [place] [address]    Culture   Final    NO GROWTH Performed at [facility], [place] [address] [place], [place] [address]    Report Status 06/16/2021 FINAL  Final     Time coordinating discharge: Over 30 minutes  SIGNED:   Wyvonnia Dusky, MD  [facility] 06/16/2021, 12:48 PM Pager   If 7PM-7AM, please contact night-coverage

## 2021-06-16 NOTE — Progress Notes (Signed)
Martin Diaz Aunt and Legal Guardian was phoned this afternoon and made aware that pt was returning back to [facility].

## 2021-06-16 NOTE — TOC Progression Note (Addendum)
Transition of Care [facility]) - Progression Note    Patient Details  Name: Martin Diaz MRN: [id number] Date of Birth: 06/27/1961  Transition of Care [facility]) CM/SW [place], RN Phone Number: 06/16/2021, 9:20 AM  Clinical Narrative:   The patient is a resident at [facility], is is able to return there today, I spoke with Miss Long the administrator at [phone], When the patient is ready to DC if the nurse will call her they will pick him up, I notified the nurse of this information  The patient has had PT in the past and I set up [facility] PT again with [facility] and provided Miss Long's contact information to arrange       Expected Discharge Plan and Services                                                 Social Determinants of Health (SDOH) Interventions    Readmission Risk Interventions No flowsheet data found.

## 2023-10-20 ENCOUNTER — Emergency Department

## 2023-10-20 ENCOUNTER — Encounter: Payer: Self-pay | Admitting: Intensive Care

## 2023-10-20 ENCOUNTER — Other Ambulatory Visit: Payer: Self-pay

## 2023-10-20 ENCOUNTER — Emergency Department
Admission: EM | Admit: 2023-10-20 | Discharge: 2023-10-28 | Disposition: A | Discharge status: Home / Self Care | Attending: Emergency Medicine | Admitting: Emergency Medicine

## 2023-10-20 DIAGNOSIS — E119 Type 2 diabetes mellitus without complications: Secondary | ICD-10-CM | POA: Insufficient documentation

## 2023-10-20 DIAGNOSIS — R296 Repeated falls: Secondary | ICD-10-CM | POA: Insufficient documentation

## 2023-10-20 DIAGNOSIS — I10 Essential (primary) hypertension: Secondary | ICD-10-CM | POA: Insufficient documentation

## 2023-10-20 LAB — CBC
HCT: 42.5 % (ref 39.0–52.0)
Hemoglobin: 13.7 g/dL (ref 13.0–17.0)
MCH: 27.4 pg (ref 26.0–34.0)
MCHC: 32.2 g/dL (ref 30.0–36.0)
MCV: 85 fL (ref 80.0–100.0)
Platelets: 214 10*3/uL (ref 150–400)
RBC: 5 MIL/uL (ref 4.22–5.81)
RDW: 13.8 % (ref 11.5–15.5)
WBC: 4.6 10*3/uL (ref 4.0–10.5)
nRBC: 0 % (ref 0.0–0.2)

## 2023-10-20 LAB — BASIC METABOLIC PANEL
Anion gap: 10 (ref 5–15)
BUN: 17 mg/dL (ref 6–20)
CO2: 27 mmol/L (ref 22–32)
Calcium: 9.7 mg/dL (ref 8.9–10.3)
Chloride: 102 mmol/L (ref 98–111)
Creatinine, Ser: 0.91 mg/dL (ref 0.61–1.24)
GFR, Estimated: >60 mL/min (ref >60)
Glucose, Bld: 96 mg/dL (ref 70–99)
Potassium: 3.7 mmol/L (ref 3.5–5.1)
Sodium: 139 mmol/L (ref 135–145)

## 2023-10-20 LAB — TROPONIN I (HIGH SENSITIVITY): Troponin I (High Sensitivity): 9 ng/L (ref <18)

## 2023-10-20 MED ORDER — ACETAMINOPHEN 325 MG PO TABS
650.0000 mg | ORAL_TABLET | Freq: Once | ORAL | Status: AC
  Administered 2023-10-20: 650 mg via ORAL
  Filled 2023-10-20: qty 2

## 2023-10-20 NOTE — Evaluation (Signed)
 Physical Therapy Evaluation Patient Details Name: Martin Diaz MRN: [id number] DOB: 1961/03/23 Today's Date: 10/20/2023  History of Present Illness  Pt is a 63 y.o. male presenting to hospital 10/20/23 for evaluation of frequent falls (including fall today landing on R side; c/o pain to R side of abdomen).  Per chart pt with multiple falls over past 3 months and with a steady decline in pt's physical ability.  PMH includes intellectual disability, kidney disease, DM, htn, depression.  Clinical Impression  Pt oriented to name, month of birthday, and that it was Thursday.  Therapist called pt's group home to obtain PLOF information.  Prior to recent concerns, per [facility] (staff at group home) pt typically independent with ambulation; recently staff assisting pt with walking/toileting d/t falls. Currently pt is max assist with bed mobility; mod assist to stand from elevated bed height; and min assist to ambulate 80 feet with RW use (shuffling gait pattern; pt tending to lean forward pushing walker forward with fast pace requiring assist for upright balance and to slow down).  No c/o pain or pain-like behavior noted during session.  Pt would currently benefit from skilled PT to address noted impairments and functional limitations (see below for any additional details).  Upon hospital discharge, pt would benefit from ongoing therapy.     If plan is discharge home, recommend the following: A lot of help with walking and/or transfers;A lot of help with bathing/dressing/bathroom;Assistance with cooking/housework;Direct supervision/assist for medications management;Direct supervision/assist for financial management;Assist for transportation;Help with stairs or ramp for entrance   Can travel by private vehicle   No    Equipment Recommendations Rolling walker (2 wheels);BSC/3in1  Recommendations for Other Services       Functional Status Assessment Patient has had a recent decline in their  functional status and demonstrates the ability to make significant improvements in function in a reasonable and predictable amount of time.     Precautions / Restrictions Precautions Precautions: Fall Recall of Precautions/Restrictions: Impaired Restrictions Weight Bearing Restrictions Per Provider Order: No      Mobility  Bed Mobility Overal bed mobility: Needs Assistance Bed Mobility: Supine to Sit, Sit to Supine     Supine to sit: Max assist, HOB elevated Sit to supine: Max assist, HOB elevated   General bed mobility comments: assist for trunk and B LE's    Transfers Overall transfer level: Needs assistance Equipment used: Rolling walker (2 wheels) Transfers: Sit to/from Stand Sit to Stand: Mod assist, From elevated surface           General transfer comment: assist to stand from mildly elevated bed height; vc's and tactile cues for UE/LE placement    Ambulation/Gait Ambulation/Gait assistance: Min assist Gait Distance (Feet): 80 Feet Assistive device: Rolling walker (2 wheels) Gait Pattern/deviations: Step-through pattern, Decreased step length - right, Decreased step length - left Gait velocity: increased     General Gait Details: shuffling gait pattern; pt tending to lean forward pushing walker forward with fast pace requiring assist for upright balance and to slow down  Stairs            Wheelchair Mobility     Tilt Bed    Modified Rankin (Stroke Patients Only)       Balance Overall balance assessment: Needs assistance Sitting-balance support: No upper extremity supported, Feet supported Sitting balance-Leahy Scale: Good Sitting balance - Comments: steady reaching within BOS   Standing balance support: Bilateral upper extremity supported, During functional activity, Reliant on assistive device for balance  Standing balance-Leahy Scale: Poor Standing balance comment: assist for balance with standing dynamic activities                              Pertinent Vitals/Pain Pain Assessment Pain Assessment: No/denies pain Vitals (HR and SpO2 on room air) stable session.    Home Living Family/patient expects to be discharged to:: Group home                        Prior Function Prior Level of Function : Needs assist             Mobility Comments: Per [facility] (staff at group home) pt typically independent with ambulation (trialed [facility] but pt just holds cane up); recently staff assisting pt with walking/toileting d/t falls. ADLs Comments: Pt typically requires assist with dressing/meals/medications.  Independent with feeding self and toileting (staff assisting recently d/t falls).  Tends to make a mess with BM on toilet per staff.     Extremity/Trunk Assessment   Upper Extremity Assessment Upper Extremity Assessment: Generalized weakness;Difficult to assess due to impaired cognition    Lower Extremity Assessment Lower Extremity Assessment: Generalized weakness;Difficult to assess due to impaired cognition       Communication   Communication Communication: Impaired Factors Affecting Communication: Difficulty expressing self (slow to respond)    Cognition Arousal: Alert Behavior During Therapy: Flat affect   PT - Cognitive impairments: History of cognitive impairments, Orientation, Awareness, Memory, Attention, Initiation, Sequencing, Problem solving, Safety/Judgement   Orientation impairments: Place, Time, Situation, Person (pt able to state name, June for birthday, and that it was Thursday)                   PT - Cognition Comments: Per staff at group home pt's orientation fluctuates.  Significant increased time to initiate movement and respond. Following commands: Impaired Following commands impaired: Follows one step commands inconsistently, Follows one step commands with increased time     Cueing Cueing Techniques: Verbal cues, Gestural cues, Tactile cues, Visual cues      General Comments  Nursing cleared pt for participation in physical therapy.  Pt agreeable to PT session.    Exercises     Assessment/Plan    PT Assessment Patient needs continued PT services  PT Problem List Decreased strength;Decreased activity tolerance;Decreased balance;Decreased mobility;Decreased cognition;Decreased knowledge of use of DME;Decreased safety awareness;Decreased knowledge of precautions       PT Treatment Interventions DME instruction;Gait training;Functional mobility training;Therapeutic exercise;Therapeutic activities;Balance training;Patient/family education    PT Goals (Current goals can be found in the Care Plan section)  Acute Rehab PT Goals Patient Stated Goal: to improve walking PT Goal Formulation: With patient Time For Goal Achievement: 11/03/23 Potential to Achieve Goals: Fair    Frequency Min 2X/week     Co-evaluation               AM-PAC PT "6 Clicks" Mobility  Outcome Measure Help needed turning from your back to your side while in a flat bed without using bedrails?: A Little Help needed moving from lying on your back to sitting on the side of a flat bed without using bedrails?: A Lot Help needed moving to and from a bed to a chair (including a wheelchair)?: A Little Help needed standing up from a chair using your arms (e.g., wheelchair or bedside chair)?: A Lot Help needed to walk in hospital room?: A Little Help  needed climbing 3-5 steps with a railing? : A Lot 6 Click Score: 15    End of Session Equipment Utilized During Treatment: Gait belt Activity Tolerance: Patient tolerated treatment well Patient left: in bed;with call bell/phone within reach (bed alarm not on upon PT arrival; unable to set bed alarm; pt in sight of nursing station (pt in hallway next to nursing station)) Nurse Communication: Mobility status PT Visit Diagnosis: Unsteadiness on feet (R26.81);Other abnormalities of gait and mobility (R26.89);Muscle weakness  (generalized) (M62.81);History of falling (Z91.81)    Time: 1643-1700 PT Time Calculation (min) (ACUTE ONLY): 17 min   Charges:   PT Evaluation $PT Eval Low Complexity: 1 Low PT Treatments $Gait Training: 8-22 mins PT General Charges $$ ACUTE PT VISIT: 1 Visit        Hendricks Limes, PT 10/20/23, 5:37 PM

## 2023-10-20 NOTE — ED Notes (Signed)
 Pt and caregiver encouraged to provide urine specimen per order.

## 2023-10-20 NOTE — ED Triage Notes (Addendum)
 Patient brought by EMS from [facility]. Multiple falls in the last three months and c/o right side pain. Caregiver states one of patients falls about a month ago resulted in him hitting his head.   Patient ambulatory  EMS vitals:  90CBG 154/78 b/p 99% RA 61HR  Caregiver reports he needs a higher level of care than the group home he is at.

## 2023-10-20 NOTE — ED Provider Notes (Signed)
 [facility] Provider Note    Event Date/Time   First MD Initiated Contact with Patient 10/20/23 1332     (approximate)   History   Fall   HPI  Martin Diaz is a 63 y.o. male with PMH of intellectual disability, kidney disease, diabetes, depression, hypertension presents for evaluation of frequent falls.  Patient had a fall today and landed on his right side.  It was a witnessed fall and he did not hit his head.  He reports pain to his right side of his abdomen.  Patient's caregiver is present here with him and is concerned as he has had multiple falls over the past 3 months.  She describes a steady decline in his physical ability.  Patient is a resident of the group home and the manager and caregiver are concerned that they do not have the ability to care for him given his decline.  Patient denies associated symptoms with the falls like dizziness, lightheadedness, chest pain, shortness of breath, nausea, vomiting, diarrhea, headaches and blurry vision.      Physical Exam   Triage Vital Signs: ED Triage Vitals  Encounter Vitals Group     BP 10/20/23 1110 122/67     Systolic BP Percentile --      Diastolic BP Percentile --      Pulse Rate 10/20/23 1110 60     Resp 10/20/23 1110 18     Temp 10/20/23 1110 98 F (36.7 C)     Temp Source 10/20/23 1110 Oral     SpO2 10/20/23 1110 97 %     Weight 10/20/23 1114 198 lb (89.8 kg)     Height 10/20/23 1114 6' (1.829 m)     Head Circumference --      Peak Flow --      Pain Score 10/20/23 1114 10     Pain Loc --      Pain Education --      Exclude from Growth Chart --     Most recent vital signs: Vitals:   10/20/23 1110  BP: 122/67  Pulse: 60  Resp: 18  Temp: 98 F (36.7 C)  SpO2: 97%   General: Awake, no distress.  CV:  Good peripheral perfusion.  RRR. Resp:  Normal effort.  CTAB Abd:  No distention.  Soft, nontender, bowel sounds appropriate.  Umbilical hernia present. Other:  Tender to  palpation along the right ribs, no bruising.  No tenderness to palpation of the spine, upper and lower extremities.  No focal neurodeficits observed.  Movement is slow but is coordinated.  No ataxia.  Radial pulses 2+ and regular.  Upper extremity strength is equal bilaterally.  Posterior tibialis pulses are 1+ and regular.  Lower extremity strength is equal bilaterally.  Patient able to walk but has short steps and shuffling gait.   ED Results / Procedures / Treatments   Labs (all labs ordered are listed, but only abnormal results are displayed) Labs Reviewed  BASIC METABOLIC PANEL  CBC  URINALYSIS, ROUTINE W REFLEX MICROSCOPIC  TROPONIN I (HIGH SENSITIVITY)     EKG  ED provider interpretation: Sinus rhythm with first-degree AV block.  Vent. rate 60 BPM PR interval 224 ms QRS duration 104 ms QT/QTcB 434/434 ms P-R-T axes 37 57 47  RADIOLOGY  CT head and chest x-ray obtained.  I interpreted the images as well as reviewed the radiologist report.  Chest x-ray was negative for any acute abnormalities.  CT of the head negative  for acute intracranial abnormalities but does show chronic microvascular ischemic changes and parenchymal volume loss.  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  PROCEDURES:  Critical Care performed: No  Procedures   MEDICATIONS ORDERED IN ED: Medications  acetaminophen (TYLENOL) tablet 650 mg (650 mg Oral Given 10/20/23 1512)     IMPRESSION / MDM / ASSESSMENT AND PLAN / ED COURSE  I reviewed the triage vital signs and the nursing notes.                             64 year old male presents for evaluation of multiple falls.  Vital signs are stable patient NAD on exam.  Differential diagnosis includes, but is not limited to, cardiac arrhythmia, pneumonia, rib fracture, electrolyte abnormality, generalized weakness, UTI.  Patient's presentation is most consistent with acute complicated illness  / injury requiring diagnostic workup.  Workup is so far reassuring and I do not see an indication for admission at this time.  CBC, BMP and troponin all within normal limits.  Chest x-ray and CT of the head were negative for acute abnormalities.  Stll waiting for a urine sample at this time.  Patient is brought to the emergency department by the caregiver at the group home for evaluation of frequent falls and generalized physical decline.  Caregiver states that patient has been increasingly weak and has normally been able to dress himself and over the past 3 months they have noticed a decline in his abilities.  They not feel they have the capacity to provide the level of care that is needed for him as this is a group home and not a skilled nursing facility.  Spoke with my attending physician, Dr. Marisa Severin who assisted with [facility] consult and physical therapy evaluation.  Disposition pending TOC and PT evaluation.    FINAL CLINICAL IMPRESSION(S) / ED DIAGNOSES   Final diagnoses:  Frequent falls     Rx / DC Orders   ED Discharge Orders     None        Note:  This document was prepared using Dragon voice recognition software and may include unintentional dictation errors.   Cameron Ali, PA-C 10/20/23 1626    Dionne Bucy, MD 10/21/23 1438    Dionne Bucy, MD 10/21/23 1438

## 2023-10-21 DIAGNOSIS — R296 Repeated falls: Secondary | ICD-10-CM | POA: Diagnosis not present

## 2023-10-21 LAB — URINALYSIS, ROUTINE W REFLEX MICROSCOPIC
Bilirubin Urine: NEGATIVE
Glucose, UA: >=500 mg/dL — AB
Hgb urine dipstick: NEGATIVE
Ketones, ur: NEGATIVE mg/dL
Leukocytes,Ua: NEGATIVE
Nitrite: NEGATIVE
Protein, ur: NEGATIVE mg/dL
Specific Gravity, Urine: 1.029 (ref 1.005–1.030)
pH: 6 (ref 5.0–8.0)

## 2023-10-21 NOTE — NC FL2 (Signed)
 [place] MEDICAID FL2 LEVEL OF CARE FORM     IDENTIFICATION  Patient Name: Martin Diaz Birthdate: 12/05/1960 Sex: male Admission Date (Current Location): 10/20/2023  [place] and [place] Number:  [address] [id number] R Facility and Address:  [facility], [address], [place], [place] [address]      Provider Number: [id number]  Attending Physician Name and Address:  No att. providers found  Relative Name and Phone Number:  Juanda Crumble [phone]    Current Level of Care: Hospital Recommended Level of Care: Skilled Nursing Facility Prior Approval Number:    Date Approved/Denied:   PASRR Number: [id number]  Discharge Plan: SNF    Current Diagnoses: Patient Active Problem List   Diagnosis Date Noted   Acute metabolic encephalopathy 06/14/2021   Cough 06/14/2021   Hypokalemia 06/14/2021   Depression 06/14/2021   Diabetes mellitus without complication (HCC) 06/14/2021   HLD (hyperlipidemia) 06/14/2021   HTN (hypertension) 06/14/2021   Metabolic acidosis    Non-traumatic rhabdomyolysis    Acute renal failure (ARF) (HCC) 06/10/2019   Pancreatitis, alcoholic, acute 06/10/2019   Alcohol use 06/10/2019   Hypocalcemia 06/10/2019    Orientation RESPIRATION BLADDER Height & Weight        Normal Continent Weight: 198 lb (89.8 kg) Height:  6' (182.9 cm)  BEHAVIORAL SYMPTOMS/MOOD NEUROLOGICAL BOWEL NUTRITION STATUS     (WDL) Continent Diet  AMBULATORY STATUS COMMUNICATION OF NEEDS Skin       Normal                       Personal Care Assistance Level of Assistance              Functional Limitations Info  Sight, Hearing, Speech Sight Info: Adequate Hearing Info: Adequate Speech Info: Adequate    SPECIAL CARE FACTORS FREQUENCY  PT (By licensed PT)     PT Frequency: 5x/s per week              Contractures Contractures Info: Not present    Additional Factors Info  Code Status, Allergies Code Status Info:  full Allergies Info: No known allergies           Current Medications (10/21/2023):  This is the current hospital active medication list No current facility-administered medications for this encounter.   Current Outpatient Medications  Medication Sig Dispense Refill   amLODipine (NORVASC) 10 MG tablet Take 1 tablet (10 mg total) by mouth daily. 30 tablet 0   AUSTEDO XR 30 MG TB24 Take 1 tablet by mouth daily.     citalopram (CELEXA) 10 MG tablet Take 10 mg by mouth daily.     cloNIDine (CATAPRES - DOSED IN MG/24 HR) 0.2 mg/24hr patch Place 1 patch (0.2 mg total) onto the skin once a week. 4 patch 12   diphenhydrAMINE (BENADRYL) 25 mg capsule Take 25 mg by mouth daily.     ergocalciferol (VITAMIN D2) 1.25 MG (50000 UT) capsule Take 50,000 Units by mouth daily.     folic acid (FOLVITE) 1 MG tablet Take 1 tablet (1 mg total) by mouth daily. 30 tablet 0   hydrALAZINE (APRESOLINE) 50 MG tablet Take 1 tablet (50 mg total) by mouth every 8 (eight) hours. 90 tablet 0   JARDIANCE 10 MG TABS tablet Take 10 mg by mouth daily.     metoprolol tartrate (LOPRESSOR) 25 MG tablet Take 1 tablet (25 mg total) by mouth 2 (two) times daily. 60 tablet 0   NEXLIZET 180-10 MG  TABS Take 1 tablet by mouth daily.     OLANZapine (ZYPREXA) 10 MG tablet Take 10 mg by mouth at bedtime.     tamsulosin (FLOMAX) 0.4 MG CAPS capsule Take 0.4 mg by mouth daily.     feeding supplement, ENSURE ENLIVE, (ENSURE ENLIVE) LIQD Take 237 mLs by mouth 2 (two) times daily between meals. 237 mL 12     Discharge Medications: Please see discharge summary for a list of discharge medications.  Relevant Imaging Results:  Relevant Lab Results:   Additional Information    Niki Cosman D Mingo Siegert, LCSW

## 2023-10-21 NOTE — ED Notes (Signed)
 Patient ambulated to bathroom with front wheel walker with assistance from this RN.

## 2023-10-21 NOTE — TOC Initial Note (Signed)
 Transition of Care [facility]) - Initial/Assessment Note    Patient Details  Name: Martin Diaz MRN: [id number] Date of Birth: 07-Oct-1960  [facility]) CM/SW Contact:    Martin Broach, LCSW Phone Number: 10/21/2023, 3:43 PM  Clinical Narrative:                 CSW phoned pt's guardian Martin Diaz, [phone]) to discuss d/c plan.  She was unaware that pt was at [facility].  CSW reached out to MD to give her a call.  CSW informed Martin Bible that the recommendation from PT is for patient is to go to SNF.  However, his insurance will not cover SNF.  CSW asked Martin Bible if she's interested in LTC for patient or if she'd like for patient to return to the group home.  She stated that she doesn't want pt to return to group home and that she hasn't heard from them yet about pt being in the hospital.  CSW informed her that there is no guarantee that a facility would be in the local area.  She stated that she was ok  with that.    Pt's PCP is Martin Nones, FNP.  Brought to hospital from group home [facility]).  Guardian Martin Diaz, [phone])  CM to continue to follow for d/c needs.  Expected Discharge Plan: Long Term Acute Care (LTAC) Barriers to Discharge: Continued Medical Work up   Patient Goals and CMS Choice            Expected Discharge Plan and Services     Post Acute Care Choice: Long Term Acute Care (LTAC) Living arrangements for the past 2 months: Group Home [facility])                                      Prior Living Arrangements/Services Living arrangements for the past 2 months: Group Home [facility]) Lives with:: Facility Resident   Do you feel safe going back to the place where you live?: No   Patient's guardian doesn't want patient to return to group home        Criminal Activity/Legal Involvement Pertinent to Current Situation/Hospitalization: No - Comment as needed  Activities of Daily Living      Permission  Sought/Granted                  Emotional Assessment         Alcohol / Substance Use: Not Applicable Psych Involvement: No (comment)  Admission diagnosis:  Fall Patient Active Problem List   Diagnosis Date Noted   Acute metabolic encephalopathy 06/14/2021   Cough 06/14/2021   Hypokalemia 06/14/2021   Depression 06/14/2021   Diabetes mellitus without complication (HCC) 06/14/2021   HLD (hyperlipidemia) 06/14/2021   HTN (hypertension) 06/14/2021   Metabolic acidosis    Non-traumatic rhabdomyolysis    Acute renal failure (ARF) (HCC) 06/10/2019   Pancreatitis, alcoholic, acute 06/10/2019   Alcohol use 06/10/2019   Hypocalcemia 06/10/2019   PCP:  Martin Gang, FNP Pharmacy:   [facility] [place], [place] - [address] [place] [address] Phone: [phone] Fax: [phone]     Social Drivers of Health (SDOH) Social History: SDOH Screenings   Tobacco Use: Low Risk  (10/20/2023)   SDOH Interventions:     Readmission Risk Interventions     No data to display

## 2023-10-21 NOTE — ED Notes (Signed)
 Patient ambulated to bathroom with front wheel walker.

## 2023-10-21 NOTE — TOC Progression Note (Addendum)
 Transition of Care [facility]) - Progression Note    Patient Details  Name: Martin Diaz MRN: [id number] Date of Birth: February 25, 1961  Transition of Care [facility]) CM/SW Contact  Colin Broach, LCSW Phone Number: 10/21/2023, 9:47 AM  Clinical Narrative:      CSW phoned pt's guardian, Roland Earl [phone]).  LVM for a return call to discuss PT recommendation.    10:37  Pt's guardian called CSW back.  She was unaware that pt is here. CSW sent Epic chat to [place], provider, to give her a call to discuss pt.  Dennie Bible will call CSW back after she speaks with the doctor.  Expected Discharge Plan and Services                                               Social Determinants of Health (SDOH) Interventions SDOH Screenings   Tobacco Use: Low Risk  (10/20/2023)    Readmission Risk Interventions     No data to display

## 2023-10-21 NOTE — ED Notes (Signed)
 Pt ambulated to bathroom with front wheel walker and standby assist from this RN. Pt required assistance standing and sitting. Pt back in bed and denies any other needs at this time.

## 2023-10-21 NOTE — ED Provider Notes (Signed)
 BP 129/70 (BP Location: Left Arm)   Pulse 73   Temp 98.3 F (36.8 C) (Oral)   Resp 18   Ht 6' (1.829 m)   Wt 89.8 kg   SpO2 92%   BMI 26.85 kg/m   No updates.  Awaiting placement.   Willy Eddy, MD 10/21/23 2014

## 2023-10-22 DIAGNOSIS — R296 Repeated falls: Secondary | ICD-10-CM | POA: Diagnosis not present

## 2023-10-22 MED ORDER — OLANZAPINE 10 MG PO TABS
10.0000 mg | ORAL_TABLET | Freq: Every day | ORAL | Status: DC
  Administered 2023-10-22 – 2023-10-27 (×6): 10 mg via ORAL
  Filled 2023-10-22 (×6): qty 1

## 2023-10-22 MED ORDER — AMLODIPINE BESYLATE 5 MG PO TABS
10.0000 mg | ORAL_TABLET | Freq: Every day | ORAL | Status: DC
  Administered 2023-10-22 – 2023-10-28 (×7): 10 mg via ORAL
  Filled 2023-10-22 (×7): qty 2

## 2023-10-22 MED ORDER — TAMSULOSIN HCL 0.4 MG PO CAPS
0.4000 mg | ORAL_CAPSULE | Freq: Every day | ORAL | Status: DC
  Administered 2023-10-22 – 2023-10-28 (×7): 0.4 mg via ORAL
  Filled 2023-10-22 (×7): qty 1

## 2023-10-22 MED ORDER — EMPAGLIFLOZIN 10 MG PO TABS
10.0000 mg | ORAL_TABLET | Freq: Every day | ORAL | Status: DC
  Administered 2023-10-22 – 2023-10-28 (×7): 10 mg via ORAL
  Filled 2023-10-22 (×7): qty 1

## 2023-10-22 MED ORDER — FOLIC ACID 1 MG PO TABS
1.0000 mg | ORAL_TABLET | Freq: Every day | ORAL | Status: DC
  Administered 2023-10-22 – 2023-10-28 (×7): 1 mg via ORAL
  Filled 2023-10-22 (×7): qty 1

## 2023-10-22 MED ORDER — ENSURE ENLIVE PO LIQD
237.0000 mL | Freq: Two times a day (BID) | ORAL | Status: DC
  Administered 2023-10-22 – 2023-10-28 (×9): 237 mL via ORAL

## 2023-10-22 MED ORDER — HYDRALAZINE HCL 50 MG PO TABS
50.0000 mg | ORAL_TABLET | Freq: Three times a day (TID) | ORAL | Status: DC
  Administered 2023-10-22 – 2023-10-28 (×19): 50 mg via ORAL
  Filled 2023-10-22 (×19): qty 1

## 2023-10-22 MED ORDER — METOPROLOL TARTRATE 25 MG PO TABS
25.0000 mg | ORAL_TABLET | Freq: Two times a day (BID) | ORAL | Status: DC
  Administered 2023-10-22 – 2023-10-28 (×13): 25 mg via ORAL
  Filled 2023-10-22 (×13): qty 1

## 2023-10-22 MED ORDER — CLONIDINE HCL 0.2 MG/24HR TD PTWK
0.2000 mg | MEDICATED_PATCH | TRANSDERMAL | Status: DC
  Administered 2023-10-23: 0.2 mg via TRANSDERMAL
  Filled 2023-10-22: qty 1

## 2023-10-22 MED ORDER — CITALOPRAM HYDROBROMIDE 20 MG PO TABS
10.0000 mg | ORAL_TABLET | Freq: Every day | ORAL | Status: DC
  Administered 2023-10-22 – 2023-10-28 (×7): 10 mg via ORAL
  Filled 2023-10-22 (×7): qty 1

## 2023-10-22 MED ORDER — DEUTETRABENAZINE ER 30 MG PO TB24
1.0000 | ORAL_TABLET | Freq: Every day | ORAL | Status: DC
  Administered 2023-10-27: 1 via ORAL

## 2023-10-22 NOTE — [place] FL2 (Signed)
 [place] MEDICAID FL2 LEVEL OF CARE FORM     IDENTIFICATION  Patient Name: Martin Diaz Birthdate: 11-Apr-1961 Sex: male Admission Date (Current Location): 10/20/2023  [place] and [place] Number:  [address] [id number] R Facility and Address:  [facility], [address] [place], [place] [address]      Provider Number: [id number]  Attending Physician Name and Address:  No att. providers found  Relative Name and Phone Number:  Juanda Crumble [phone]    Current Level of Care: Hospital Recommended Level of Care: Skilled Nursing Facility Prior Approval Number:    Date Approved/Denied:   PASRR Number: [id number]  Discharge Plan: SNF    Current Diagnoses: Patient Active Problem List   Diagnosis Date Noted   Acute metabolic encephalopathy 06/14/2021   Cough 06/14/2021   Hypokalemia 06/14/2021   Depression 06/14/2021   Diabetes mellitus without complication (HCC) 06/14/2021   HLD (hyperlipidemia) 06/14/2021   HTN (hypertension) 06/14/2021   Metabolic acidosis    Non-traumatic rhabdomyolysis    Acute renal failure (ARF) (HCC) 06/10/2019   Pancreatitis, alcoholic, acute 06/10/2019   Alcohol use 06/10/2019   Hypocalcemia 06/10/2019    Orientation RESPIRATION BLADDER Height & Weight     Self, Place  Normal Continent Weight: 198 lb (89.8 kg) Height:  6' (182.9 cm)  BEHAVIORAL SYMPTOMS/MOOD NEUROLOGICAL BOWEL NUTRITION STATUS     (WDL) Continent Diet (Regular)  AMBULATORY STATUS COMMUNICATION OF NEEDS Skin   Limited Assist Verbally Normal                       Personal Care Assistance Level of Assistance  Bathing, Feeding, Dressing Bathing Assistance: Limited assistance Feeding assistance: Independent Dressing Assistance: Limited assistance     Functional Limitations Info  Sight, Hearing, Speech Sight Info: Adequate Hearing Info: Adequate Speech Info: Adequate    SPECIAL CARE FACTORS FREQUENCY  PT (By licensed PT)      PT Frequency: 5x/s per week              Contractures Contractures Info: Not present    Additional Factors Info  Code Status, Allergies Code Status Info: Full Allergies Info: No known allergies listed           Current Medications (10/22/2023):  This is the current hospital active medication list Current Facility-Administered Medications  Medication Dose Route Frequency Provider Last Rate Last Admin   amLODipine (NORVASC) tablet 10 mg  10 mg Oral Daily Pilar Jarvis, MD   10 mg at 10/22/23 0939   citalopram (CELEXA) tablet 10 mg  10 mg Oral Daily Pilar Jarvis, MD   10 mg at 10/22/23 0939   [START ON 10/23/2023] cloNIDine (CATAPRES - Dosed in mg/24 hr) patch 0.2 mg  0.2 mg Transdermal Weekly Pilar Jarvis, MD       Deutetrabenazine ER [id number] 1 tablet  1 tablet Oral Daily Pilar Jarvis, MD       empagliflozin (JARDIANCE) tablet 10 mg  10 mg Oral Daily Pilar Jarvis, MD   10 mg at 10/22/23 0939   feeding supplement (ENSURE ENLIVE / ENSURE PLUS) liquid 237 mL  237 mL Oral BID BM Pilar Jarvis, MD   237 mL at 10/22/23 0941   folic acid (FOLVITE) tablet 1 mg  1 mg Oral Daily Pilar Jarvis, MD   1 mg at 10/22/23 0939   hydrALAZINE (APRESOLINE) tablet 50 mg  50 mg Oral Q8H Pilar Jarvis, MD   50 mg at 10/22/23 0731   metoprolol tartrate (LOPRESSOR) tablet  25 mg  25 mg Oral BID Pilar Jarvis, MD   25 mg at 10/22/23 0939   OLANZapine (ZYPREXA) tablet 10 mg  10 mg Oral QHS Pilar Jarvis, MD   10 mg at 10/22/23 0111   tamsulosin (FLOMAX) capsule 0.4 mg  0.4 mg Oral Daily Pilar Jarvis, MD   0.4 mg at 10/22/23 4098   Current Outpatient Medications  Medication Sig Dispense Refill   amLODipine (NORVASC) 10 MG tablet Take 1 tablet (10 mg total) by mouth daily. 30 tablet 0   AUSTEDO XR 30 MG TB24 Take 1 tablet by mouth daily.     citalopram (CELEXA) 10 MG tablet Take 10 mg by mouth daily.     cloNIDine (CATAPRES - DOSED IN MG/24 HR) 0.2 mg/24hr patch Place 1 patch (0.2 mg total) onto the skin once a week. 4 patch  12   diphenhydrAMINE (BENADRYL) 25 mg capsule Take 25 mg by mouth daily.     ergocalciferol (VITAMIN D2) 1.25 MG (50000 UT) capsule Take 50,000 Units by mouth daily.     folic acid (FOLVITE) 1 MG tablet Take 1 tablet (1 mg total) by mouth daily. 30 tablet 0   hydrALAZINE (APRESOLINE) 50 MG tablet Take 1 tablet (50 mg total) by mouth every 8 (eight) hours. 90 tablet 0   JARDIANCE 10 MG TABS tablet Take 10 mg by mouth daily.     metoprolol tartrate (LOPRESSOR) 25 MG tablet Take 1 tablet (25 mg total) by mouth 2 (two) times daily. 60 tablet 0   NEXLIZET 180-10 MG TABS Take 1 tablet by mouth daily.     OLANZapine (ZYPREXA) 10 MG tablet Take 10 mg by mouth at bedtime.     tamsulosin (FLOMAX) 0.4 MG CAPS capsule Take 0.4 mg by mouth daily.     feeding supplement, ENSURE ENLIVE, (ENSURE ENLIVE) LIQD Take 237 mLs by mouth 2 (two) times daily between meals. 237 mL 12     Discharge Medications: Please see discharge summary for a list of discharge medications.  Relevant Imaging Results:  Relevant Lab Results:   Additional Information SSN [id number]  Susa Simmonds, LCSWA

## 2023-10-22 NOTE — ED Provider Notes (Signed)
 Emergency Medicine Observation Re-evaluation Note  Physical Exam   BP (!) 141/74   Pulse 61   Temp 98 F (36.7 C) (Oral)   Resp 18   Ht 6' (1.829 m)   Wt 89.8 kg   SpO2 96%   BMI 26.85 kg/m   Patient appears in no acute distress.  ED Course / MDM   No reported events during my shift at the time of this note.   Pt is awaiting dispo from consultants   Pilar Jarvis MD    Pilar Jarvis, MD 10/22/23 [phone]

## 2023-10-22 NOTE — ED Notes (Signed)
Pt ambulated to the bathroom and back with a walker 

## 2023-10-22 NOTE — ED Notes (Signed)
 Pt's face washed and pt given eye mask to aid with sleeping.

## 2023-10-22 NOTE — ED Notes (Signed)
TOC Placement 

## 2023-10-22 NOTE — ED Notes (Signed)
 Pt assisted to bathroom, ambulatory with walker.  Assistance with peri care and  change of clothes provided.  Pt assisted back to bed and repositioned for comfort.

## 2023-10-23 DIAGNOSIS — R296 Repeated falls: Secondary | ICD-10-CM | POA: Diagnosis not present

## 2023-10-23 NOTE — ED Notes (Signed)
VOL TOC placement 

## 2023-10-23 NOTE — ED Provider Notes (Signed)
 Emergency Medicine Observation Re-evaluation Note  Teion Ballin is a 63 y.o. male, seen on rounds today.  Pt initially presented to the ED for complaints of Fall  Currently, the patient is resting in bed. No reported issues from nursing team.   Physical Exam  BP 104/65 (BP Location: Right Arm)   Pulse 64   Temp 98.4 F (36.9 C) (Oral)   Resp 17   Ht 6' (1.829 m)   Wt 89.8 kg   SpO2 95%   BMI 26.85 kg/m  Physical Exam General: Resting in bed  ED Course / MDM   No labs last 24 hours.  Plan  Current plan is for dispo per social work.    Trinna Post, MD 10/23/23 [phone]

## 2023-10-23 NOTE — ED Notes (Signed)
 Pt ask to go to the bathroom, RN notified and ask for help, took pt to bathroom, he urinated in the toliet and on the floor, changed pt and put a brief on him. Put clean pads on his bed and clean warm blanket.

## 2023-10-23 NOTE — ED Notes (Signed)
 Provided pt a dinner tray as I passing out snack in QUAD pt seemed to have a need of being hungry. Provided and RN notified

## 2023-10-23 NOTE — ED Notes (Signed)
 Assisted pt to the restroom. Stand by assist w/ walker. Pericare provided. New paper scrubs given.

## 2023-10-23 NOTE — ED Notes (Signed)
 Pt provided peri care and full linen change at this time. Pt given warm blankets and positioned for comfort.

## 2023-10-24 DIAGNOSIS — R296 Repeated falls: Secondary | ICD-10-CM | POA: Diagnosis not present

## 2023-10-24 NOTE — ED Notes (Signed)
 Pt ambulatory to bathroom with walker and 1 person assist. Pt assisted with urinal and then assisted back to bed. Pt assisted with food tray (cut into smaller bites) so that patient could safely eat without difficulty.

## 2023-10-24 NOTE — Progress Notes (Signed)
 Physical Therapy Treatment Patient Details Name: Martin Diaz MRN: [id number] DOB: 08/19/1960 Today's Date: 10/24/2023   History of Present Illness Martin Diaz is a 62yoM presenting to hospital 10/20/23 for evaluation of frequent falls (including fall today landing on R side; c/o pain to R side of abdomen).  Per chart pt with multiple falls over past 3 months and with a steady decline in pt's physical ability.  PMH includes intellectual disability, kidney disease, DM, htn, depression.    PT Comments  Pt caught after breakfast, still in ED hallway, very much awake, interactive agreeable to session. Pt remains weak, transfers slow, unsteady, and require significant elevation for modified independence. Pt tolerates 2 bouts of AMB, both limited to 64ft before pt asks for a rest break, albeit no LOB while using RW. Will continue to follow.    If plan is discharge home, recommend the following: A lot of help with walking and/or transfers;A lot of help with bathing/dressing/bathroom;Assistance with cooking/housework;Direct supervision/assist for medications management;Direct supervision/assist for financial management;Assist for transportation;Help with stairs or ramp for entrance   Can travel by private vehicle     No  Equipment Recommendations  Rolling walker (2 wheels);BSC/3in1    Recommendations for Other Services       Precautions / Restrictions Precautions Precautions: Fall Recall of Precautions/Restrictions: Impaired Restrictions Weight Bearing Restrictions Per Provider Order: No     Mobility  Bed Mobility Overal bed mobility: Needs Assistance Bed Mobility: Supine to Sit, Sit to Supine     Supine to sit: Max assist, HOB elevated Sit to supine: Max assist   General bed mobility comments: more trunk weakness than leg weakness    Transfers Overall transfer level: Needs assistance Equipment used: Rolling walker (2 wheels) Transfers: Sit to/from Stand Sit to Stand: From  elevated surface           General transfer comment: >20 inches; multiple times; all bradykinetic and heavy hand use fo balance facilitaation.    Ambulation/Gait Ambulation/Gait assistance: Contact guard assist Gait Distance (Feet): 60 Feet Assistive device: Rolling walker (2 wheels)   Gait velocity: slow, almost parkinsoniam like (rigid trunk, small steps, biggest difficulty with turning)         Stairs             Wheelchair Mobility     Tilt Bed    Modified Rankin (Stroke Patients Only)       Balance                                            Communication    Cognition Arousal: Alert Behavior During Therapy: Flat affect, WFL for tasks assessed/performed   PT - Cognitive impairments: History of cognitive impairments                                Cueing    Exercises Other Exercises Other Exercises: 3xSTS from elevated EOB minGuard assist (minA once to arrest posterio LOB) Other Exercises: AMB twice for 33ft with RW, multiple short laps to work on turning and balance control with RW    General Comments        Pertinent Vitals/Pain Pain Assessment Pain Assessment: No/denies pain    Home Living  Prior Function            PT Goals (current goals can now be found in the care plan section) Acute Rehab PT Goals Patient Stated Goal: to improve walking PT Goal Formulation: With patient Time For Goal Achievement: 11/03/23 Potential to Achieve Goals: Fair Progress towards PT goals: Progressing toward goals    Frequency    Min 2X/week      PT Plan      Co-evaluation              AM-PAC PT "6 Clicks" Mobility   Outcome Measure  Help needed turning from your back to your side while in a flat bed without using bedrails?: A Lot Help needed moving from lying on your back to sitting on the side of a flat bed without using bedrails?: A Lot Help needed moving to and  from a bed to a chair (including a wheelchair)?: A Lot Help needed standing up from a chair using your arms (e.g., wheelchair or bedside chair)?: A Lot Help needed to walk in hospital room?: A Little Help needed climbing 3-5 steps with a railing? : A Lot 6 Click Score: 13    End of Session Equipment Utilized During Treatment: Gait belt Activity Tolerance: Patient tolerated treatment well;Patient limited by fatigue;No increased pain Patient left: in bed;with call bell/phone within reach Nurse Communication: Mobility status PT Visit Diagnosis: Unsteadiness on feet (R26.81);Other abnormalities of gait and mobility (R26.89);Muscle weakness (generalized) (M62.81);History of falling (Z91.81)     Time: 8657-8469 PT Time Calculation (min) (ACUTE ONLY): 20 min  Charges:    $Therapeutic Activity: 8-22 mins PT General Charges $$ ACUTE PT VISIT: 1 Visit                    11:32 AM, 10/24/23 Rosamaria Lints, PT, DPT Physical Therapist - [facility]  [phone] (ASCOM)    Lanell Carpenter C 10/24/2023, 11:30 AM

## 2023-10-24 NOTE — ED Notes (Signed)
 Breakfast tray delivered

## 2023-10-24 NOTE — ED Notes (Addendum)
 Patient resting in bed.

## 2023-10-24 NOTE — ED Notes (Signed)
 Pt ambulatory to bathroom with rolling walker and minimal assist. Pt returned from bathroom to bed and meal tray prepared for him to eat.

## 2023-10-24 NOTE — Progress Notes (Signed)
 Occupational Therapy Evaluation Patient Details Name: Martin Diaz MRN: [id number] DOB: [birth year] Today's Date: 10/24/2023   History of Present Illness   Anton Cheramie is a 62yoM presenting to hospital 10/20/23 for evaluation of frequent falls (including fall today landing on R side; c/o pain to R side of abdomen).  Per chart pt with multiple falls over past 3 months and with a steady decline in pt's physical ability.  PMH includes intellectual disability, kidney disease, DM, htn, depression.     Clinical Impressions Pt was seen for OT evaluation this date. Prior to hospital admission, pt was living at a group home, reliant on group home staff for assistance with dressing, meals and medication. Pt presents to acute OT demonstrating impaired ADL performance, balance deficits and  functional mobility 2/2  (See OT problem list for additional functional deficits). Pt required MAX cues for sequencing including hand placement and MAXA for trunk control/LE management. Seemly more difficult for pt supine<>sit than sit<>supine. Pt completed STS from elevated EOB with RW+ MODA requried for lift off. MODA for weightshifting while marching in place. Pt took later steps up the bed with CGA + MAX cuing for task completion. Pt reported not feeling well in standing and wanted to sit down. Pt retied in bed, set up for lunch. Noted pt coughing after a few bites, pt able to clear throat and drink some drink. RN sitting in eye sight of pt while eating his food. Pt would benefit from skilled OT services to address noted impairments and functional limitations (see below for any additional details) in order to maximize safety and independence while minimizing falls risk and caregiver burden. OT will follow acutely.     If plan is discharge home, recommend the following:   A lot of help with walking and/or transfers;A lot of help with bathing/dressing/bathroom;Assistance with cooking/housework;Assistance with  feeding;Direct supervision/assist for medications management;Direct supervision/assist for financial management;Assist for transportation;Help with stairs or ramp for entrance;Supervision due to cognitive status     Functional Status Assessment   Patient has had a recent decline in their functional status and demonstrates the ability to make significant improvements in function in a reasonable and predictable amount of time.     Equipment Recommendations   Other (comment) (Defer to next venue of care)     Recommendations for Other Services         Precautions/Restrictions   Precautions Precautions: Fall Recall of Precautions/Restrictions: Impaired Restrictions Weight Bearing Restrictions Per Provider Order: No     Mobility Bed Mobility Overal bed mobility: Needs Assistance Bed Mobility: Supine to Sit, Sit to Supine     Supine to sit: HOB elevated, Min assist Sit to supine: HOB elevated, Used rails, Max assist   General bed mobility comments: MAX cues for sequencing including hand placement and MAXA for trunk control/LE management    Transfers Overall transfer level: Needs assistance Equipment used: Rolling walker (2 wheels) Transfers: Sit to/from Stand Sit to Stand: From elevated surface           General transfer comment: x2 attempts before bed clearing      Balance Overall balance assessment: Needs assistance Sitting-balance support: No upper extremity supported, Feet supported Sitting balance-Leahy Scale: Fair     Standing balance support: Bilateral upper extremity supported, During functional activity, Reliant on assistive device for balance Standing balance-Leahy Scale: Poor Standing balance comment: Heavy reliant on RW in standing, pt reports wanting to not feeling good when in standing.  ADL either performed or assessed with clinical judgement   ADL Overall ADL's : Needs  assistance/impaired Eating/Feeding: Supervision/ safety;Bed level   Grooming: Wash/dry face;Sitting;Supervision/safety               Lower Body Dressing: Maximal assistance;Bed level   Toilet Transfer: BSC/3in1;Moderate assistance;Cueing for safety;Cueing for sequencing (Anticipate)           Functional mobility during ADLs: Rolling walker (2 wheels);Moderate assistance (MOD A for lift off and weight shifting) General ADL Comments: Pt completed STS from elevated EOB with RW+ MODA requried for lift off. MODA for weightshifting while marching in place.                                Pertinent Vitals/Pain Pain Assessment Pain Assessment: No/denies pain     Extremity/Trunk Assessment Upper Extremity Assessment Upper Extremity Assessment: Generalized weakness   Lower Extremity Assessment Lower Extremity Assessment: Defer to PT evaluation;Generalized weakness       Communication Communication Communication: Impaired Factors Affecting Communication: Difficulty expressing self   Cognition Arousal: Alert Behavior During Therapy: Flat affect Cognition: History of cognitive impairments, Cognition impaired   Orientation impairments: Place, Time, Situation (Oriented to self only)     Attention impairment (select first level of impairment): Sustained attention Executive functioning impairment (select all impairments): Initiation, Organization, Sequencing, Reasoning, Problem solving OT - Cognition Comments: Pt was lethargic on inital greeting, slowly awaken after conversation attempts.                 Following commands: Impaired Following commands impaired: Follows one step commands inconsistently, Follows one step commands with increased time     Cueing  General Comments   Cueing Techniques: Verbal cues;Gestural cues;Tactile cues;Visual cues  Pt found with food on his chest and asleep in hallway bed.   Exercises Exercises: Other exercises Other  Exercises Other Exercises: Edu: Role of OT, benefits of OOB. sequencing throughout mobility   Shoulder Instructions      Home Living Family/patient expects to be discharged to:: Group home                                 Additional Comments: Will need to follow up for further details      Prior Functioning/Environment Prior Level of Function : Needs assist             Mobility Comments: Chart review from 10/20/23: Per [facility] (staff at group home) pt typically independent with ambulation (trialed [facility] but pt just holds cane up); recently staff assisting pt with walking/toileting d/t falls. ADLs Comments: Chart review from 10/20/23: Pt typically requires assist with dressing/meals/medications.  Independent with feeding self and toileting (staff assisting recently d/t falls).  Tends to make a mess with BM on toilet per staff.    OT Problem List: Decreased range of motion;Impaired balance (sitting and/or standing);Decreased activity tolerance;Decreased cognition;Decreased safety awareness;Decreased knowledge of use of DME or AE   OT Treatment/Interventions: Self-care/ADL training;Therapeutic exercise;Energy conservation;DME and/or AE instruction;Cognitive remediation/compensation;Patient/family education;Balance training      OT Goals(Current goals can be found in the care plan section)   Acute Rehab OT Goals Patient Stated Goal: get better OT Goal Formulation: With patient Time For Goal Achievement: 11/07/23 Potential to Achieve Goals: Good ADL Goals Pt Will Perform Grooming: with min assist Pt Will Perform Lower Body Dressing: with min assist Pt  Will Transfer to Toilet: with min assist Pt Will Perform Toileting - Clothing Manipulation and hygiene: with min assist   OT Frequency:  Min 2X/week    Co-evaluation              AM-PAC OT "6 Clicks" Daily Activity     Outcome Measure Help from another person eating meals?: A Little Help from another  person taking care of personal grooming?: A Lot Help from another person toileting, which includes using toliet, bedpan, or urinal?: A Lot Help from another person bathing (including washing, rinsing, drying)?: A Lot Help from another person to put on and taking off regular upper body clothing?: A Lot Help from another person to put on and taking off regular lower body clothing?: A Lot 6 Click Score: 13   End of Session Equipment Utilized During Treatment: Gait belt;Rolling walker (2 wheels) Nurse Communication: Mobility status  Activity Tolerance: Patient tolerated treatment well Patient left: in bed;with call bell/phone within reach;with bed alarm set;with nursing/sitter in room  OT Visit Diagnosis: Unsteadiness on feet (R26.81);Repeated falls (R29.6);Muscle weakness (generalized) (M62.81);Other symptoms and signs involving cognitive function                Time: 9518-8416 OT Time Calculation (min): 20 min Charges:  OT General Charges $OT Visit: 1 Visit OT Evaluation $OT Eval Moderate Complexity: 1 Mod  Gina Leblond M.S. OTR/L  10/24/23, 3:10 PM

## 2023-10-24 NOTE — ED Notes (Signed)
 OT at bedside.

## 2023-10-24 NOTE — ED Notes (Signed)
 PT at bedside.

## 2023-10-25 DIAGNOSIS — R296 Repeated falls: Secondary | ICD-10-CM | POA: Diagnosis not present

## 2023-10-25 NOTE — ED Notes (Addendum)
 Patients family member asking for stool softner. RN educated patients family member that patient had a BM yesterday: 10/24/2023

## 2023-10-25 NOTE — TOC Progression Note (Signed)
 Transition of Care [facility]) - Progression Note    Patient Details  Name: Martin Diaz MRN: [id number] Date of Birth: 1960/09/03  [facility] CM/SW Contact  Margarito Liner, LCSW Phone Number: 10/25/2023, 4:16 PM  Clinical Narrative: [facility] confirmed they can accept patient under Medicare waiver and is aware of plan to transition to LTC. [facility] and [facility] liaison confirmed waiver as well and will review referral again. [facility] cannot accept waiver and would have to take Medicaid pending and complete asset check for plan change. [facility] is reviewing. Left messages for [facility], [facility], and [facility].  Expected Discharge Plan: Long Term Acute Care (LTAC) Barriers to Discharge: Continued Medical Work up  Expected Discharge Plan and Services     Post Acute Care Choice: Long Term Acute Care (LTAC) Living arrangements for the past 2 months: Group Home [facility])                                       Social Determinants of Health (SDOH) Interventions SDOH Screenings   Tobacco Use: Low Risk  (10/20/2023)    Readmission Risk Interventions     No data to display

## 2023-10-25 NOTE — ED Provider Notes (Signed)
-----------------------------------------   6:21 AM on 10/25/2023 -----------------------------------------   Blood pressure 132/87, pulse (!) 57, temperature 98.6 F (37 C), temperature source Oral, resp. rate 18, height 6' (1.829 m), weight 89.8 kg, SpO2 95%.  The patient is calm and cooperative at this time.  There have been no acute events since the last update.  Awaiting disposition plan from Social Work team.   Irean Hong, MD 10/25/23 [phone]

## 2023-10-25 NOTE — ED Notes (Addendum)
 Pt resting comfortably with call light in reach and wheels locked and side rails raised. Warm blanket given and family bedside.

## 2023-10-25 NOTE — ED Notes (Signed)
 Pt up to restroom w/ stand by assist. Pt uses walker to aid w/ ambulation. Pt back in bed and provided w/ warm blankets. Denies needs at this time.

## 2023-10-25 NOTE — ED Notes (Signed)
 Pt ambulated to BR with walker, and back to bed. No incident. Pericare applied and bed/bedside table cleaned up. CB in reach, fall risk interventions applied.

## 2023-10-25 NOTE — ED Notes (Addendum)
 This RN received report from Clayborn Bigness RN. This RN introduced self to pt. Call light in reach, bed wheels locked, side rail raised, pt updated on plan of care. Rounding completed.

## 2023-10-25 NOTE — ED Notes (Signed)
Patient sitting up on the side of bed eating lunch

## 2023-10-26 DIAGNOSIS — R296 Repeated falls: Secondary | ICD-10-CM | POA: Diagnosis not present

## 2023-10-26 NOTE — Progress Notes (Signed)
 Occupational Therapy Treatment Patient Details Name: Decoda Van MRN: [id number] DOB: [birth year] Today's Date: 10/26/2023   History of present illness Bronco Mcgrory is a 62yoM presenting to hospital 10/20/23 for evaluation of frequent falls (including fall on day of admission landing on R side; c/o pain to R side of abdomen).  Per chart pt with multiple falls over past 3 months and with a steady decline in pt's physical ability.  PMH includes intellectual disability, kidney disease, DM, htn, depression.   OT comments  Mr. Wampole was seen for OT treatment on this date. Upon arrival to room pt supine in bed, sleeping soundly, but wakes to VCs, agreeable to OT Tx session. OT facilitated ADL management with education and assist as described below. See ADL section for additional details regarding occupational performance. Pt continues to be functionally limited by decreased cognition, decreased activity tolerance, and generalized weakness. He requires MIN A for sts t/fs and MAX A for peri-care after BM this date. Pt return verbalizes understanding of education provided t/o session. Pt is progressing toward OT goals and continues to benefit from skilled OT services to maximize return to PLOF and minimize risk of future falls, injury, caregiver burden, and readmission. Will continue to follow POC as written. Discharge recommendation remains appropriate.         If plan is discharge home, recommend the following:  A lot of help with walking and/or transfers;A lot of help with bathing/dressing/bathroom;Assistance with cooking/housework;Assistance with feeding;Direct supervision/assist for medications management;Direct supervision/assist for financial management;Assist for transportation;Help with stairs or ramp for entrance;Supervision due to cognitive status   Equipment Recommendations  Other (comment) (defer)    Recommendations for Other Services      Precautions / Restrictions  Precautions Precautions: Fall Recall of Precautions/Restrictions: Impaired Restrictions Weight Bearing Restrictions Per Provider Order: No       Mobility Bed Mobility Overal bed mobility: Needs Assistance Bed Mobility: Supine to Sit, Sit to Supine     Supine to sit: HOB elevated, Min assist Sit to supine: HOB elevated, Min assist        Transfers Overall transfer level: Needs assistance Equipment used: Rolling walker (2 wheels) Transfers: Sit to/from Stand Sit to Stand: Min assist           General transfer comment: CGA for functional mobility in hall.     Balance Overall balance assessment: Needs assistance Sitting-balance support: No upper extremity supported, Feet supported Sitting balance-Leahy Scale: Fair Sitting balance - Comments: steady reaching within BOS   Standing balance support: Bilateral upper extremity supported, During functional activity, Reliant on assistive device for balance, No upper extremity supported Standing balance-Leahy Scale: Fair Standing balance comment: steady static standing at sink without UE support.                           ADL either performed or assessed with clinical judgement   ADL Overall ADL's : Needs assistance/impaired     Grooming: Standing;Wash/dry hands Grooming Details (indicate cue type and reason): Increased time to perform with cueng to locate materials during [facility] at sink.             Lower Body Dressing: Sitting/lateral leans;Set up;Supervision/safety Lower Body Dressing Details (indicate cue type and reason): Able to reach down to adjust bilat socks at EOB prior to toilet transfer. Toilet Transfer: Rolling walker (2 wheels);Regular Toilet;Ambulation;Contact guard assist;Cueing for safety;Cueing for sequencing;Minimal assistance Toilet Transfer Details (indicate cue type and reason): Able to transfer to  hall bathroom with CGA for safety t/o. MIN A to STS from low commode, anticipate improved  functional independence with elevated commode. Toileting- Clothing Manipulation and Hygiene: Maximal assistance;Sit to/from stand;Moderate assistance Toileting - Clothing Manipulation Details (indicate cue type and reason): MAX A for peri-care after BM. MOD A to pull up pants. Educated on safety/falls prevention strategies t/o functional activities.     Functional mobility during ADLs: Minimal assistance;Rolling walker (2 wheels) General ADL Comments: MIN A for initial STS from EOB. Able to progress to CGA for safety during functional mobility in hall.    Extremity/Trunk Assessment Upper Extremity Assessment Upper Extremity Assessment: Generalized weakness   Lower Extremity Assessment Lower Extremity Assessment: Generalized weakness        Vision       Perception     Praxis     Communication Communication Communication: Impaired Factors Affecting Communication: Difficulty expressing self;Reduced clarity of speech   Cognition Arousal: Alert Behavior During Therapy: Flat affect Cognition: History of cognitive impairments, Cognition impaired     Awareness: Intellectual awareness impaired, Online awareness impaired   Attention impairment (select first level of impairment): Sustained attention Executive functioning impairment (select all impairments): Initiation, Organization, Sequencing, Reasoning, Problem solving                   Following commands: Impaired Following commands impaired: Follows one step commands inconsistently, Follows one step commands with increased time      Cueing   Cueing Techniques: Verbal cues, Gestural cues, Tactile cues, Visual cues  Exercises Other Exercises Other Exercises: OT facilitated ADL management as described above.    Shoulder Instructions       General Comments      Pertinent Vitals/ Pain       Pain Assessment Pain Assessment: No/denies pain  Home Living                                           Prior Functioning/Environment              Frequency  Min 2X/week        Progress Toward Goals  OT Goals(current goals can now be found in the care plan section)  Progress towards OT goals: Progressing toward goals  Acute Rehab OT Goals OT Goal Formulation: With patient Time For Goal Achievement: 11/07/23 Potential to Achieve Goals: Good  Plan      Co-evaluation                 AM-PAC OT "6 Clicks" Daily Activity     Outcome Measure   Help from another person eating meals?: A Little Help from another person taking care of personal grooming?: A Little Help from another person toileting, which includes using toliet, bedpan, or urinal?: A Lot Help from another person bathing (including washing, rinsing, drying)?: A Lot Help from another person to put on and taking off regular upper body clothing?: A Lot Help from another person to put on and taking off regular lower body clothing?: A Lot 6 Click Score: 14    End of Session Equipment Utilized During Treatment: Gait belt;Rolling walker (2 wheels)  OT Visit Diagnosis: Unsteadiness on feet (R26.81);Repeated falls (R29.6);Muscle weakness (generalized) (M62.81);Other symptoms and signs involving cognitive function   Activity Tolerance Patient tolerated treatment well   Patient Left in bed;with call bell/phone within reach;with bed alarm set;with nursing/sitter in room  Nurse Communication Mobility status        Time: 1610-9604 OT Time Calculation (min): 16 min  Charges: OT General Charges $OT Visit: 1 Visit OT Treatments $Self Care/Home Management : 8-22 mins  Rockney Ghee, M.S., OTR/L 10/26/23, 12:49 PM

## 2023-10-26 NOTE — ED Notes (Signed)
   Pt ambulated to BR with walker, and back to bed. No incident. Pericare applied and bed/bedside table cleaned up. CB in reach, fall risk interventions applied.

## 2023-10-26 NOTE — ED Provider Notes (Signed)
-----------------------------------------   6:06 AM on 10/26/2023 -----------------------------------------   Blood pressure 125/72, pulse 80, temperature 98.3 F (36.8 C), resp. rate 18, height 6' (1.829 m), weight 89.8 kg, SpO2 95%.  The patient is calm and cooperative at this time.  There have been no acute events since the last update.  Awaiting disposition plan from case management/social work.    Valoree Agent, Layla Maw, DO 10/26/23 [phone]

## 2023-10-26 NOTE — ED Notes (Addendum)
 This RN gave report to [employer] RN and performed bedside care handoff. Call light in reach, bed wheels locked, side rails raised, pt updated on plan of care. Rounding completed. Fall risk parameters in place.

## 2023-10-26 NOTE — ED Notes (Signed)
 Ambulated with walker and assistance to bathroom. Pts pants and top changed, Pt had gotten urine on clothes. Pt assisted back to side of bed to eat breakfast at this time

## 2023-10-26 NOTE — ED Notes (Addendum)
  Pt ambulated to BR with walker, and back to bed. No incident. Pericare applied and bed/bedside table cleaned up. CB in reach, fall risk interventions applied.

## 2023-10-27 DIAGNOSIS — R296 Repeated falls: Secondary | ICD-10-CM | POA: Diagnosis not present

## 2023-10-27 NOTE — ED Provider Notes (Signed)
-----------------------------------------   5:33 AM on 10/27/2023 -----------------------------------------   Blood pressure (!) 147/77, pulse 64, temperature 98 F (36.7 C), temperature source Oral, resp. rate 18, height 6' (1.829 m), weight 89.8 kg, SpO2 95%.  The patient is calm and cooperative at this time.  There have been no acute events since the last update.  Awaiting disposition plan from Social Work team.   Irean Hong, MD 10/27/23 [phone]

## 2023-10-27 NOTE — TOC Progression Note (Addendum)
 Transition of Care [facility]) - Progression Note    Patient Details  Name: Martin Diaz MRN: [id number] Date of Birth: 03/21/1961  [facility] CM/SW Contact  Margarito Liner, LCSW Phone Number: 10/27/2023, 9:58 AM  Clinical Narrative:   Left aunt a voicemail. Will provide update on SNF bed offers when she calls back.  10:38 am: Received call back from aunt. Provided update. She is agreeable to moving forward with [facility] if they can offer a bed. She gave permission for the admissions coordinator to call his group home to get more information on him.  3:12 pm: [facility] can offer patient a bed until Medicare waiver. If patient meets criteria for LTC at their facility after rehab, they will work on program change for his Medicaid. If not, they will work on getting him to one of their ALF's.  3:57 pm: [facility] can accept patient today if discharged. MD and aunt are aware. CSW notified aunt that insurance will likely not cover EMS transport. She is agreeable to [employer].  4:33 pm: SNF can accept patient tomorrow. Left aunt a voicemail to notify and see if family can transport tomorrow.  Expected Discharge Plan: Long Term Acute Care (LTAC) Barriers to Discharge: Continued Medical Work up  Expected Discharge Plan and Services     Post Acute Care Choice: Long Term Acute Care (LTAC) Living arrangements for the past 2 months: Group Home [facility])                                       Social Determinants of Health (SDOH) Interventions SDOH Screenings   Tobacco Use: Low Risk  (10/20/2023)    Readmission Risk Interventions     No data to display

## 2023-10-27 NOTE — ED Notes (Signed)
 This RN assisted pt with using the urinal.

## 2023-10-27 NOTE — ED Notes (Signed)
Pt assisted with urinal at this time.  

## 2023-10-27 NOTE — Progress Notes (Signed)
 Physical Therapy Treatment Patient Details Name: Martin Diaz MRN: [id number] DOB: [birth year] Today's Date: 10/27/2023   History of Present Illness Martin Diaz is a 62yoM presenting to hospital 10/20/23 for evaluation of frequent falls (including fall on day of admission landing on R side; c/o pain to R side of abdomen).  Per chart pt with multiple falls over past 3 months and with a steady decline in pt's physical ability.  PMH includes intellectual disability, kidney disease, DM, htn, depression.    PT Comments  Pt up with OT in bathroom upon PT arrival. He was able to ambulate >356ft with RW and CGA, but exhibited varying gait speeds and more unsteadiness with greater speed; pt educated on RW positioning, use, and safety. He was also able to perform some therapeutic exercises but needed constant verbal and tactile cueing. Standing static balance activities with 0-2 hand support, pt unable to perform dynamic tasks without RW, endorsed all standing balance activities as "hard". Returned to supine with needs in reach. The patient would benefit from further skilled PT intervention to continue to progress towards goals.   If plan is discharge home, recommend the following: A lot of help with walking and/or transfers;A lot of help with bathing/dressing/bathroom;Assistance with cooking/housework;Direct supervision/assist for medications management;Direct supervision/assist for financial management;Assist for transportation;Help with stairs or ramp for entrance   Can travel by private vehicle     No  Equipment Recommendations  Rolling walker (2 wheels);BSC/3in1    Recommendations for Other Services       Precautions / Restrictions Precautions Precautions: Fall Recall of Precautions/Restrictions: Impaired Restrictions Weight Bearing Restrictions Per Provider Order: No     Mobility  Bed Mobility Overal bed mobility: Needs Assistance Bed Mobility: Sit to Supine           General bed  mobility comments: extra time, and cues for safety    Transfers Overall transfer level: Needs assistance Equipment used: Rolling walker (2 wheels) Transfers: Sit to/from Stand                  Ambulation/Gait Ambulation/Gait assistance: Contact guard assist Gait Distance (Feet): 350 Feet Assistive device: Rolling walker (2 wheels) Gait Pattern/deviations: Step-through pattern, Decreased step length - right, Decreased step length - left       General Gait Details: varying gait speed, noted for increased unsteadiness with increasing speed; unable to attempt ambulation without RW   Stairs             Wheelchair Mobility     Tilt Bed    Modified Rankin (Stroke Patients Only)       Balance Overall balance assessment: Needs assistance Sitting-balance support: No upper extremity supported, Feet supported Sitting balance-Leahy Scale: Good     Standing balance support: Bilateral upper extremity supported, During functional activity, Reliant on assistive device for balance Standing balance-Leahy Scale: Fair                              Hotel manager: Impaired Factors Affecting Communication: Difficulty expressing self;Reduced clarity of speech  Cognition Arousal: Alert Behavior During Therapy: Flat affect   PT - Cognitive impairments: History of cognitive impairments                         Following commands: Impaired Following commands impaired: Follows one step commands with increased time    Cueing Cueing Techniques: Verbal cues, Tactile cues  Exercises Other  Exercises Other Exercises: seated LAQ, marching pt needed tactile and verbal cues throughout to stay on task and maintain form/technique Other Exercises: standing static balance activities with 0-2 hand support, pt unable to perform dynamic tasks without RW, endorsed all standing balance activities as "hard"    General Comments         Pertinent Vitals/Pain Pain Assessment Pain Assessment: No/denies pain    Home Living                          Prior Function            PT Goals (current goals can now be found in the care plan section) Progress towards PT goals: Progressing toward goals    Frequency    Min 2X/week      PT Plan      Co-evaluation              AM-PAC PT "6 Clicks" Mobility   Outcome Measure  Help needed turning from your back to your side while in a flat bed without using bedrails?: A Little Help needed moving from lying on your back to sitting on the side of a flat bed without using bedrails?: A Little Help needed moving to and from a bed to a chair (including a wheelchair)?: A Little Help needed standing up from a chair using your arms (e.g., wheelchair or bedside chair)?: A Little Help needed to walk in hospital room?: A Little Help needed climbing 3-5 steps with a railing? : A Lot 6 Click Score: 17    End of Session   Activity Tolerance: Patient tolerated treatment well Patient left: in bed;with call bell/phone within reach;with bed alarm set Nurse Communication: Mobility status PT Visit Diagnosis: Unsteadiness on feet (R26.81);Other abnormalities of gait and mobility (R26.89);Muscle weakness (generalized) (M62.81);History of falling (Z91.81)     Time: 1049-1101 PT Time Calculation (min) (ACUTE ONLY): 12 min  Charges:    $Therapeutic Exercise: 8-22 mins PT General Charges $$ ACUTE PT VISIT: 1 Visit                     Olga Coaster PT, DPT 3:08 PM,10/27/23

## 2023-10-27 NOTE — ED Notes (Signed)
Water provided to patient at this time.

## 2023-10-27 NOTE — Progress Notes (Signed)
 Occupational Therapy Treatment Patient Details Name: Martin Diaz MRN: [id number] DOB: [birth year] Today's Date: 10/27/2023   History of present illness Martin Diaz is a 62yoM presenting to hospital 10/20/23 for evaluation of frequent falls (including fall on day of admission landing on R side; c/o pain to R side of abdomen).  Per chart pt with multiple falls over past 3 months and with a steady decline in pt's physical ability.  PMH includes intellectual disability, kidney disease, DM, htn, depression.   OT comments  Pt appears to be attempting to utilize urinal but agreeable to ambulate to bathroom for needs. Pt performs supine >sit with min guard and stands with min A with use of RW. Pt ambulating with RW 40' to bathroom with CGA- min A for safety and balance. Pt needing min A for balance with LB clothing management. He was able to have BM while seated and needs min A for balance while performing hygiene and clothing management. PT arrived at bathroom and pt transitioned to PT session without issue.       If plan is discharge home, recommend the following:  A lot of help with walking and/or transfers;A lot of help with bathing/dressing/bathroom;Assistance with cooking/housework;Assistance with feeding;Direct supervision/assist for medications management;Direct supervision/assist for financial management;Assist for transportation;Help with stairs or ramp for entrance;Supervision due to cognitive status   Equipment Recommendations  Other (comment) (defer to next venue of care)       Precautions / Restrictions Precautions Precautions: Fall Recall of Precautions/Restrictions: Impaired       Mobility Bed Mobility Overal bed mobility: Needs Assistance Bed Mobility: Supine to Sit     Supine to sit: HOB elevated, Contact guard          Transfers Overall transfer level: Needs assistance Equipment used: Rolling walker (2 wheels) Transfers: Sit to/from Stand Sit to Stand: Min  assist                 Balance Overall balance assessment: Needs assistance Sitting-balance support: No upper extremity supported, Feet supported Sitting balance-Leahy Scale: Good     Standing balance support: Bilateral upper extremity supported, During functional activity, Reliant on assistive device for balance Standing balance-Leahy Scale: Fair                             ADL either performed or assessed with clinical judgement   ADL Overall ADL's : Needs assistance/impaired     Grooming: Standing;Wash/dry hands;Contact guard assist                   Toilet Transfer: Rolling walker (2 wheels);Regular Toilet;Ambulation;Contact guard assist;Cueing for safety;Cueing for sequencing;Minimal assistance   Toileting- Clothing Manipulation and Hygiene: Sit to/from stand;Minimal assistance              Extremity/Trunk Assessment Upper Extremity Assessment Upper Extremity Assessment: Generalized weakness   Lower Extremity Assessment Lower Extremity Assessment: Generalized weakness        Vision Patient Visual Report: No change from baseline           Communication Communication Communication: Impaired Factors Affecting Communication: Difficulty expressing self;Reduced clarity of speech   Cognition Arousal: Alert Behavior During Therapy: Flat affect Cognition: History of cognitive impairments, Cognition impaired   Orientation impairments: Place, Time, Situation Awareness: Intellectual awareness impaired, Online awareness impaired   Attention impairment (select first level of impairment): Sustained attention Executive functioning impairment (select all impairments): Initiation, Organization, Sequencing, Reasoning, Problem solving  Following commands: Impaired Following commands impaired: Follows one step commands inconsistently, Follows one step commands with increased time      Cueing   Cueing Techniques: Verbal  cues, Gestural cues, Tactile cues, Visual cues  Exercises              Pertinent Vitals/ Pain       Pain Assessment Pain Assessment: No/denies pain         Frequency  Min 2X/week        Progress Toward Goals  OT Goals(current goals can now be found in the care plan section)  Progress towards OT goals: Progressing toward goals      AM-PAC OT "6 Clicks" Daily Activity     Outcome Measure   Help from another person eating meals?: A Little Help from another person taking care of personal grooming?: A Little Help from another person toileting, which includes using toliet, bedpan, or urinal?: A Lot Help from another person bathing (including washing, rinsing, drying)?: A Lot Help from another person to put on and taking off regular upper body clothing?: A Little Help from another person to put on and taking off regular lower body clothing?: A Lot 6 Click Score: 15    End of Session Equipment Utilized During Treatment: Rolling walker (2 wheels)  OT Visit Diagnosis: Unsteadiness on feet (R26.81);Repeated falls (R29.6);Muscle weakness (generalized) (M62.81);Other symptoms and signs involving cognitive function   Activity Tolerance Patient tolerated treatment well   Patient Left in bed;with call bell/phone within reach;with bed alarm set;with nursing/sitter in room   Nurse Communication Mobility status        Time: 1040-1050 OT Time Calculation (min): 10 min  Charges: OT General Charges $OT Visit: 1 Visit OT Treatments $Self Care/Home Management : 8-22 mins  Jackquline Denmark, MS, OTR/L , CBIS ascom [phone]  10/27/23, 1:18 PM

## 2023-10-28 DIAGNOSIS — R296 Repeated falls: Secondary | ICD-10-CM | POA: Diagnosis not present

## 2023-10-28 NOTE — ED Notes (Signed)
 Print production planner for  Transfer to [facility]

## 2023-10-28 NOTE — ED Notes (Signed)
 Patient ambulated to bathroom with front wheel walker. Pt placed in clean disposable underwear.

## 2023-10-28 NOTE — ED Provider Notes (Signed)
-----------------------------------------   6:39 AM on 10/28/2023 -----------------------------------------   Blood pressure (!) 130/58, pulse 75, temperature 98.5 F (36.9 C), temperature source Oral, resp. rate 17, height 6' (1.829 m), weight 89.8 kg, SpO2 95%.  The patient is calm and cooperative at this time.  There have been no acute events since the last update.  Awaiting disposition plan from case management/social work.    Caydin Yeatts, Layla Maw, [place] 10/28/23 [phone]

## 2023-10-28 NOTE — ED Provider Notes (Signed)
 Pt denies any concerns- understands he is going to facility today. SW has plans to go to [employer].    Concha Se, MD 10/28/23 [phone]

## 2023-10-28 NOTE — ED Notes (Signed)
 Pt resting with eyes closed, visible from nurses station. Respirations even non-labored.

## 2023-10-28 NOTE — ED Notes (Signed)
 Pt cleaned, new brief applied, new sheets applied to bed.

## 2023-10-28 NOTE — TOC Transition Note (Signed)
 Transition of Care [facility]) - Discharge Note   Patient Details  Name: Martin Diaz MRN: [id number] Date of Birth: [birth year]  Transition [facility] CM/SW Contact:  Colin Broach, LCSW Phone Number: 10/28/2023, 2:47 PM   Clinical Narrative:  Patient discharging to [facility].  All report information obtained from [facility].  Patient to be transported to facility via [employer].  Roland Earl, pt's guardian, notified and she gave permission for pt transport.  No other needs from TOC.  CSW signing off.     Final next level of care: Skilled Nursing Facility Barriers to Discharge: No Barriers Identified   Patient Goals and CMS Choice            Discharge Placement                Patient to be transferred to facility by: [facility] Name of family member notified: Guardian, Roland Earl, notified of transport Patient and family notified of of transfer: 10/28/23  Discharge Plan and Services Additional resources added to the After Visit Summary for       Post Acute Care Choice: Long Term Acute Care (LTAC)                               Social Drivers of Health (SDOH) Interventions SDOH Screenings   Tobacco Use: Low Risk  (10/20/2023)     Readmission Risk Interventions     No data to display

## 2023-10-28 NOTE — ED Notes (Signed)
 Pt ambulated to bathroom with front wheel walker.
